# Patient Record
Sex: Female | Born: 1970
Health system: Southern US, Community
[De-identification: ages and names within clinical notes are randomized; demographics above are authoritative.]

## PROBLEM LIST (undated history)

## (undated) DIAGNOSIS — N289 Disorder of kidney and ureter, unspecified: Secondary | ICD-10-CM

## (undated) DIAGNOSIS — F329 Major depressive disorder, single episode, unspecified: Secondary | ICD-10-CM

## (undated) DIAGNOSIS — K829 Disease of gallbladder, unspecified: Secondary | ICD-10-CM

## (undated) DIAGNOSIS — K219 Gastro-esophageal reflux disease without esophagitis: Secondary | ICD-10-CM

## (undated) DIAGNOSIS — E559 Vitamin D deficiency, unspecified: Secondary | ICD-10-CM

## (undated) DIAGNOSIS — Z87448 Personal history of other diseases of urinary system: Secondary | ICD-10-CM

## (undated) DIAGNOSIS — R002 Palpitations: Secondary | ICD-10-CM

## (undated) DIAGNOSIS — D649 Anemia, unspecified: Secondary | ICD-10-CM

## (undated) DIAGNOSIS — F32A Depression, unspecified: Secondary | ICD-10-CM

## (undated) DIAGNOSIS — M549 Dorsalgia, unspecified: Secondary | ICD-10-CM

## (undated) DIAGNOSIS — G43909 Migraine, unspecified, not intractable, without status migrainosus: Secondary | ICD-10-CM

## (undated) HISTORY — DX: Anemia, unspecified: D64.9

## (undated) HISTORY — DX: Gastro-esophageal reflux disease without esophagitis: K21.9

## (undated) HISTORY — DX: Dorsalgia, unspecified: M54.9

## (undated) HISTORY — DX: Vitamin D deficiency, unspecified: E55.9

## (undated) HISTORY — PX: CYSTOSCOPY: SUR368

## (undated) HISTORY — DX: Palpitations: R00.2

## (undated) HISTORY — DX: Personal history of other diseases of urinary system: Z87.448

## (undated) HISTORY — DX: Disorder of kidney and ureter, unspecified: N28.9

## (undated) HISTORY — DX: Major depressive disorder, single episode, unspecified: F32.9

## (undated) HISTORY — DX: Disease of gallbladder, unspecified: K82.9

## (undated) HISTORY — PX: TUBAL LIGATION: SHX77

## (undated) HISTORY — DX: Depression, unspecified: F32.A

## (undated) HISTORY — PX: OTHER SURGICAL HISTORY: SHX169

## (undated) HISTORY — DX: Migraine, unspecified, not intractable, without status migrainosus: G43.909

---

## 2000-01-23 ENCOUNTER — Other Ambulatory Visit: Admission: RE | Admit: 2000-01-23 | Discharge: 2000-01-23 | Payer: Self-pay | Admitting: Obstetrics & Gynecology

## 2001-01-07 ENCOUNTER — Other Ambulatory Visit: Admission: RE | Admit: 2001-01-07 | Discharge: 2001-01-07 | Payer: Self-pay | Admitting: Obstetrics and Gynecology

## 2004-06-20 ENCOUNTER — Other Ambulatory Visit: Admission: RE | Admit: 2004-06-20 | Discharge: 2004-06-20 | Payer: Self-pay | Admitting: *Deleted

## 2005-06-21 ENCOUNTER — Other Ambulatory Visit: Admission: RE | Admit: 2005-06-21 | Discharge: 2005-06-21 | Payer: Self-pay | Admitting: *Deleted

## 2005-12-01 ENCOUNTER — Inpatient Hospital Stay (HOSPITAL_COMMUNITY): Admission: AD | Admit: 2005-12-01 | Discharge: 2005-12-03 | Payer: Self-pay | Admitting: Obstetrics and Gynecology

## 2006-07-24 ENCOUNTER — Inpatient Hospital Stay (HOSPITAL_COMMUNITY): Admission: AD | Admit: 2006-07-24 | Discharge: 2006-07-28 | Payer: Self-pay | Admitting: Obstetrics and Gynecology

## 2006-07-25 ENCOUNTER — Encounter (INDEPENDENT_AMBULATORY_CARE_PROVIDER_SITE_OTHER): Payer: Self-pay | Admitting: Specialist

## 2007-11-28 HISTORY — PX: CHOLECYSTECTOMY: SHX55

## 2008-02-28 ENCOUNTER — Inpatient Hospital Stay (HOSPITAL_COMMUNITY): Admission: RE | Admit: 2008-02-28 | Discharge: 2008-03-02 | Payer: Self-pay | Admitting: Obstetrics and Gynecology

## 2008-02-28 ENCOUNTER — Encounter (INDEPENDENT_AMBULATORY_CARE_PROVIDER_SITE_OTHER): Payer: Self-pay | Admitting: Obstetrics and Gynecology

## 2008-05-04 ENCOUNTER — Emergency Department (HOSPITAL_COMMUNITY): Admission: EM | Admit: 2008-05-04 | Discharge: 2008-05-05 | Payer: Self-pay | Admitting: Emergency Medicine

## 2008-06-09 ENCOUNTER — Encounter: Admission: RE | Admit: 2008-06-09 | Discharge: 2008-06-09 | Payer: Self-pay | Admitting: Gastroenterology

## 2008-10-15 ENCOUNTER — Encounter (INDEPENDENT_AMBULATORY_CARE_PROVIDER_SITE_OTHER): Payer: Self-pay | Admitting: General Surgery

## 2008-10-15 ENCOUNTER — Ambulatory Visit (HOSPITAL_COMMUNITY): Admission: RE | Admit: 2008-10-15 | Discharge: 2008-10-15 | Payer: Self-pay | Admitting: General Surgery

## 2008-11-26 ENCOUNTER — Emergency Department (HOSPITAL_COMMUNITY): Admission: EM | Admit: 2008-11-26 | Discharge: 2008-11-26 | Payer: Self-pay | Admitting: Family Medicine

## 2008-12-01 ENCOUNTER — Encounter
Admission: RE | Admit: 2008-12-01 | Discharge: 2008-12-01 | Payer: Self-pay | Admitting: Physical Medicine and Rehabilitation

## 2009-02-21 ENCOUNTER — Emergency Department (HOSPITAL_COMMUNITY): Admission: EM | Admit: 2009-02-21 | Discharge: 2009-02-21 | Payer: Self-pay | Admitting: Family Medicine

## 2009-03-06 ENCOUNTER — Emergency Department (HOSPITAL_COMMUNITY): Admission: EM | Admit: 2009-03-06 | Discharge: 2009-03-06 | Payer: Self-pay | Admitting: Family Medicine

## 2009-06-22 ENCOUNTER — Ambulatory Visit (HOSPITAL_COMMUNITY): Admission: RE | Admit: 2009-06-22 | Discharge: 2009-06-22 | Payer: Self-pay | Admitting: Obstetrics and Gynecology

## 2009-06-22 ENCOUNTER — Encounter (INDEPENDENT_AMBULATORY_CARE_PROVIDER_SITE_OTHER): Payer: Self-pay | Admitting: Obstetrics and Gynecology

## 2011-03-05 LAB — CBC
HCT: 33.5 % — ABNORMAL LOW (ref 36.0–46.0)
MCHC: 34 g/dL (ref 30.0–36.0)
MCV: 85.6 fL (ref 78.0–100.0)
Platelets: 274 10*3/uL (ref 150–400)
RBC: 3.92 MIL/uL (ref 3.87–5.11)

## 2011-03-08 LAB — POCT URINALYSIS DIP (DEVICE)
Hgb urine dipstick: NEGATIVE
Nitrite: NEGATIVE
Specific Gravity, Urine: <=1.005 (ref 1.005–1.030)
Urobilinogen, UA: 0.2 mg/dL (ref 0.0–1.0)

## 2011-03-08 LAB — WET PREP, GENITAL: Trich, Wet Prep: NONE SEEN

## 2011-03-24 ENCOUNTER — Inpatient Hospital Stay (INDEPENDENT_AMBULATORY_CARE_PROVIDER_SITE_OTHER)
Admission: RE | Admit: 2011-03-24 | Discharge: 2011-03-24 | Disposition: A | Discharge status: Home / Self Care | Payer: 59 | Source: Ambulatory Visit | Attending: Family Medicine | Admitting: Family Medicine

## 2011-03-24 DIAGNOSIS — J029 Acute pharyngitis, unspecified: Secondary | ICD-10-CM

## 2011-04-11 NOTE — Op Note (Signed)
[name], [name]              ACCOUNT NO.:  [id number]   MEDICAL RECORD NO.:  [id number]          PATIENT TYPE:  INP   LOCATION:  9109                          FACILITY[facility]  [facility]   PHYSICIAN:  Maxie Better, M.D.DATE OF BIRTH:  07-Sep-1971   DATE OF PROCEDURE:  02/28/2008  DATE OF DISCHARGE:                               OPERATIVE REPORT   PREOPERATIVE DIAGNOSIS:  Previous cesarean section,  term gestation,  desires permanent sterilization   POSTOPERATIVE DIAGNOSIS:  Previous Cesarean section, term gestation,  desires permanent sterilization   PROCEDURE:  Repeat Cesarean section, Modified Pomeroy Tubal ligation   SURGEON:  Maxie Better, MD.   ANESTHESIA:  Spinal.   ASSISTANT:  None.   SPECIMENS:  Placenta not sent to Pathology, portions of right and left  fallopian tubes to Pathology.   ESTIMATED BLOOD LOSS:  800 ml.   INTRAOPERATIVE FLUIDS:  3300 ml.   URINE OUTPUT:  300 ml of clear yellow urine.   Sponge, needle, and instrument counts were correct.   COMPLICATIONS:  None.  The patient tolerated the procedure well and was  sent to the recovery room in stable condition.   PROCEDURE:  After adequate spinal anesthesia, the patient was placed in  the supine position and was sterilely prepped and draped in the usual  amnner.  The patient was placed in the left lateral position where she  was thoroughly prepped and draped in the usual fashion, the bladder had  an indwelling Foley catheter sterilely placed.  40% Marcaine was  injected at the previous site of the old skin incision site.  The skin  incision was then made, carried down to the rectus fascia, the rectus  fascia was opened transversely, the rectus fascia was then bluntly and  sharply dissected off the rectus muscle in a superior aand inferior  fashion, the rectus muscle was split in the midline, the parietal  peritoneum was entered bluntly and extended, the vesicouterine  peritoneum was opened  transversely after bladder adhesions were lysed.  A curvilinear low transverse uterine incision was then made and extended  with bandage scissors, artificial rupture of membranes was performed,  clear amniotic fluid was noted.  Subsequently, the baby was noted to be  floating in vertex, a vacuum was then used to stabilize and deliver the  head, the baby was bulb suctioned on the abdomen, a loop of cord was  noted against the chest, the baby was subsequently delivered, the cord  was clamped and cut, the baby was handed to the awaiting pediatrician,  who assigned Apgar's of 8 and 9 at one and five minutes, the placenta  was spontaneously intact, the uterine cavity was cleaned of debris, the  uterine incision had no extension, the uterine incision was closed in  two layers, the first had a #0 Monocryl in a running lock stitch, the  second layer with imbricating #0 Monocryl stitch.  Attention was then  turned to the midportion of oth fallopian tubes.  With a Babcock, the  underlying mesosalpinx was opened with electrocautery, the proximal and  distal portions of both fallopian tubes segment was then  tied with #0  chromic sutures x2 proximally and distally, and the intervening segments  were bilaterally removed.  The abdomen was then copiously irrigated and  cleaned of  debris.  There was some bleeding noted on incision line  which were hemostased with suturing.  The parietal peritoneum was then  closed, the vesicouterine peritoneum was not  closed, the rectus fascia  was closed with #0 Vicryl x2. The subcutaneous area was irrigated and  small bleeders cauterized.  Interrupted #2-0 plain sutures were placed  to close the subcutaneous  areas.  The skin was approximated with  Ethibond staples. The patient tolerated the procedure well and was  transferred to recovery area in stable condition.  __________      Maxie Better, M.D.  Electronically Signed     [place]/MEDQ  D:  02/28/2008  T:   02/28/2008  Job:  [id number]

## 2011-04-11 NOTE — Op Note (Signed)
NAMEMAILIN, COGLIANESE              ACCOUNT NO.:  [id number]   MEDICAL RECORD NO.:  [id number]          PATIENT TYPE:  AMB   LOCATION:  SDC                           [facility]  [facility]   PHYSICIAN:  Maxie Better, M.D.DATE OF BIRTH:  1971-09-17   DATE OF PROCEDURE:  06/22/2009  DATE OF DISCHARGE:                               OPERATIVE REPORT   PREOPERATIVE DIAGNOSIS:  Menorrhagia.   PROCEDURE:  Diagnostic hysteroscopy, dilation and curettage, NovaSure  endometrial ablation.   POSTOPERATIVE DIAGNOSIS:  Menorrhagia.   ANESTHESIA:  MAC paracervical block.   SURGEON:  Maxie Better, MD   PROCEDURE:  Under adequate monitored anesthesia, the patient was placed  in the dorsolithotomy position.  She was sterilely prepped and draped in  usual fashion.  Bladder was catheterized for moderate amount of urine.  Examination under anesthesia revealed anteverted, slightly bulky uterus.  No adnexal masses could be appreciated.  A bivalve speculum was placed  in vagina.  A single-tooth tenaculum was placed in the anterior lip of  the cervix.  A 20 mL of 1% Nesacaine was injected paracervically at 3  and 9 o'clock.  The uterus sounded to 9 cm and the initial endocervical  canal sounded to 3.5 cm.  However, the sounding apparatus that comes  with the NovaSure was utilized and the cavity length was actually 6.5.  The diagnostic hysteroscope was introduced.  Both tubal ostia could be  seen.  Focal anterior thickening of the endometrium was noted  anteriorly.  Gentle curettage was performed of the tissue and sent to  Pathology as endometrial curetting.  The NovaSure apparatus was then  inserted.  The cavity tested.  The cavity width of 3.8 cm was then found  and after testing power of 136 with the time of ablation of 75 seconds  was noted.  The apparatus was then subsequently removed.  The  hysteroscope was inserted.  Good endometrial ablation was noted.  At  that point, all instruments were  then removed from the vagina.   SPECIMEN:  Endometrial curetting.   ESTIMATED BLOOD LOSS:  Minimal.   COMPLICATIONS:  None.   FLUID DEFICIT:  100 mL.   Sponge and instrument counts x2 was correct.   COMPLICATIONS:  None.   The patient tolerated the procedure well and was transferred to recovery  room in stable condition.      Maxie Better, M.D.  Electronically Signed     [place]/MEDQ  D:  06/22/2009  T:  06/22/2009  Job:  [id number]

## 2011-04-11 NOTE — Op Note (Signed)
NAMEBRITNEY, [name]              ACCOUNT NO.:  [id number]   MEDICAL RECORD NO.:  [id number]          PATIENT TYPE:  AMB   LOCATION:  DAY                          FACILITY:  [facility]   PHYSICIAN:  Adolph Pollack, M.D.DATE OF BIRTH:  18-Oct-1971   DATE OF PROCEDURE:  10/15/2008  DATE OF DISCHARGE:                               OPERATIVE REPORT   PREOPERATIVE DIAGNOSIS:  Symptomatic cholelithiasis.   POSTOPERATIVE DIAGNOSIS:  Symptomatic cholelithiasis.   PROCEDURE:  Laparoscopic cholecystectomy with intraoperative  cholangiogram.   SURGEON:  Adolph Pollack, M.D.   ASSISTANT:  Consuello Bossier, M.D.   ANESTHESIA:  General.   INDICATIONS:  Ms. [name] is a 40 year old female who has had three  episodes of biliary colic and has a 1 cm gallstone.  Common bile duct  was normal in diameter.  Because of her symptomatic cholelithiasis she  now presents for laparoscopic cholecystectomy.  The procedure risks and  aftercare were discussed with her preoperatively.   TECHNIQUE:  She was brought to the operating room, placed supine on the  operating table and general anesthetic was administered.  Her abdominal  wall was sterilely prepped and draped.  Marcaine was infiltrated in the  subumbilical region.  A small subumbilical incision was made through the  skin, subcutaneous tissue, fascia and entered the peritoneal cavity  under direct vision.  A pursestring suture of zero Vicryl was placed  around the fascial edges.  A Hasson trocar was introduced into the  peritoneal cavity and pneumoperitoneum created by insufflation of CO2  gas.   Next a laparoscope was introduced.  She was noted to have a slightly  pale appearing gallbladder.  She was placed in reverse Trendelenburg  position and the right side tilted slightly upward.   An 11 mm trocar was then placed in the epigastric region and two 5 mm  trocars placed in the right upper quadrant.  I had do a limited  mobilization of the  falciform ligament to allow for better retraction of  the gallbladder.  The fundus of the gallbladder was grasped and  retracted to the right shoulder.  The infundibulum was grasped and using  dissection close to the gallbladder I mobilized the infundibulum.  I  then identified the cystic duct and created a window around it.  There  was an anterior branch of the cystic artery which I identified, clipped  and divided.  Following this I placed a clip at the cystic duct  gallbladder junction.  A small incision was made in the cystic duct with  minimal back flow of bile.  I then placed a cholangiocatheter through  the anterior abdominal wall and was trying to put a cholangiocath into  the cystic duct but there appeared to be a valve in this area.  I was  able to dilate the cystic duct somewhat and then changed over to a  Rettig  catheter.  I was able to place into the cystic duct and  whereupon injecting saline noticed there was what appeared be somewhat  of a somewhat obstructed area at the cystic duct common bile duct  junction.  This was actually soft so it did not feel like a stone.   I went ahead and proceeded with the cholangiogram.  Under real time  fluoroscopy dilute contrast was injected some of which extravasated  around the catheter site but the rest of which went through.  The cystic  duct looked to be of moderate length, common bile duct filled and  promptly drained bile without evidence of any obstruction, the hepatic  right and left hepatic ducts filled.  I did not see an obvious stone.  I  suspect that this was just a valve near the cystic duct bile duct  junction.   I then removed the cholangiocatheter and clipped the cystic duct  proximally and divided.  I then began dissecting the gallbladder free  from the liver and a small hole was made in the gallbladder and a white  bile was spilled out and evacuated with suction.  I then identified a  posterior artery clipped this  and it was divided.  The gallbladder was  dissected the rest of the way using electrocautery and once off of the  liver placed in an Endopouch bag.   I then copiously irrigated out the gallbladder fossa and controlled  bleeding with electrocautery.  I evacuated the fluid as much as  possible.  Once hemostasis was adequate I placed a piece of Surgicel at  the gallbladder fossa.  The gallbladder was removed in an Endopouch bag  through the subumbilical port and the subumbilical fascial defect closed  by tightening up and tying down the pursestring suture.  The remaining  trocars were removed and the pneumoperitoneum was released.  The skin  incisions were closed with 4-0 Monocryl subcuticular stitches followed  by Steri-Strips and sterile dressings.  She tolerated the procedure well  without any apparent complications and was taken to recovery in  satisfactory condition.      Adolph Pollack, M.D.  Electronically Signed     [place]/MEDQ  D:  10/15/2008  T:  10/15/2008  Job:  [id number]   cc:   Chales Salmon. Abigail Miyamoto, M.D.  Fax: [phone]

## 2011-04-14 NOTE — Discharge Summary (Signed)
[name], PODOLAK              ACCOUNT NO.:  [id number]   MEDICAL RECORD NO.:  [id number]          PATIENT TYPE:  INP   LOCATION:  9310                          [facility]ACILITY:  [facility]   PHYSICIAN:  Maxie Better, M.D.DATE OF BIRTH:  1971/08/07   DATE OF ADMISSION:  12/01/2005  DATE OF DISCHARGE:  12/03/2005                                 DISCHARGE SUMMARY   ADMITTING DIAGNOSES:  1.  Acute left pyelonephritis.  2.  Intrauterine gestation at 6 weeks.   DISCHARGE DIAGNOSES:  1.  Left pyelonephritis.  2.  Intrauterine gestation at 6+ weeks.   HISTORY OF PRESENT ILLNESS:  40 year old G1, P0 [redacted] weeks gestation admitted  with a low grade fever and left flank pain.  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   HOSPITAL COURSE:  The patient was admitted to [facility] with  diagnosis of acute onset left pyelonephritis.  She was started on IV  ceftriaxone.  Urine culture was sent.  By hospital day #3 patient began  having watery diarrhea.  Her flank pain had decreased.  Her urine culture  had needed to be reincubated.  The diagnosis of viral syndrome was  entertained at that point.  The IV ceftriaxone was discontinued.  Patient  was started on Imodium.  She was discharged on Keflex 500 mg p.o. q.6h.  pending the final urine culture.   DISPOSITION:  Home.   CONDITION ON DISCHARGE:  Stable.   DISCHARGE MEDICATIONS:  Keflex 500 mg p.o. q.6h.   FOLLOW-UP:  [facility] in the coming week for obstetrical care.   DISCHARGE INSTRUCTIONS:  Call for temperature greater or equal to 100.4,  recurrence of her back pain.      Maxie Better, M.D.  Electronically Signed     [place]/MEDQ  D:  01/10/2006  T:  01/10/2006  Job:  [id number]

## 2011-04-14 NOTE — Op Note (Signed)
[name], [name]              ACCOUNT NO.:  [id number]   MEDICAL RECORD NO.:  [id number]          PATIENT TYPE:  INP   LOCATION:  9113                          [facility]   PHYSICIAN:  Maxie Better, M.D.DATE OF BIRTH:  1971/07/29   DATE OF PROCEDURE:  07/25/2006  DATE OF DISCHARGE:                                 OPERATIVE REPORT   PREOPERATIVE DIAGNOSES:  1. Nonreassuring fetal tracing.  2. Intrauterine growth restriction.  3. Term gestation.  4. Oligohydramnios.   PROCEDURE:  Primary low transverse cesarean section.   POSTOPERATIVE DIAGNOSES:  1. Nonreassuring fetal tracing.  2. Oligohydramnios.  3. Intrauterine growth restriction.  4. Term gestation.   ANESTHESIA:  Spinal.   SURGEON:  Maxie Better, M.D.   ASSISTANT:  Richardean Sale, M.D.   INDICATIONS:  A 40 year old G2 P0 female at 39-6/7 weeks, admitted on July 24, 2006 for induction of labor secondary to oligohydramnios and  intrauterine growth restriction on ultrasound done on July 24, 2006.  The  patient presented with complaint of decreased fetal movement.  She had a  reactive nonstress test.  However, biophysical profile and amniotic fluid  index was ordered, which revealed the oligohydramnios and the intrauterine  growth restriction.  Normal Doppler studies were noted.  The patient was  brought to [facility].  She had Cervidil placement.  She was noted to  be group B Streptococcus positive and had been on suppressive therapy  throughout the pregnancy.  The Cervidil stayed in approximately 11-1/2  hours, with a reassuring tracing.  Subsequently, the tracing was noted to  have fetal tachycardia, with variable decelerations.  Her cervical exam  remained closed and unamenable for a fetal evaluation.  Therefore,  recommendation for cesarean section was made.  Risks of the surgical  procedure were reviewed with the patient, including but not limited to  infection, bleeding, injury to  the bladder, bowel, ureters, internal scar  tissues, and possible need for cesarean section in the future.  The patient  was then transferred to the operating room.   PROCEDURE:  Under adequate spinal anesthesia, the patient was placed in the  supine position with a left lateral tilt.  She was sterilely prepped and  draped in the usual fashion.  Indwelling Foley catheter was sterilely  placed.  0.25% Marcaine was injected along the planned Pfannenstiel incision  site.  Pfannenstiel skin incision was then made, carried down to the rectus  fascia.  The rectus fascia was opened transversely.  The rectus fascia was  then bluntly and sharply dissected off the rectus muscle in superior and  inferior fashion.  The rectus muscle was split in the midline.  The parietal  peritoneum was opened sharply and extended.  The vesicouterine peritoneum  was opened transversely.  The bladder was then bluntly dissected off the  lower uterine segment and then displaced inferiorly using the bladder  retractor.  A curvilinear low transverse uterine incision was then carefully  made and extended with bandage scissors.  On entering the uterine cavity,  pea-soup thick meconium was noted.  Live female infant was delivered.  The  baby was deLeed for moderate amount of meconium.  Cord was then clamped,  cut, and the baby was transferred to the awaiting pediatrician, who  ultimately assigned Apgars of 4 and 9 at one and five minutes.  The placenta  was manually removed and sent to pathology.  Cord pH was obtained, which was  7.29.  The uterine cavity was cleaned of debris.  Uterine incision had no  extension.  Uterine incision was closed in two layers, the first layer with  0 Monocryl running-locked stitch.  The second layer was imbricated using 0  Monocryl suture.  Good hemostasis was noted.  Normal tubes and ovaries were  noted bilaterally.  The abdomen was copiously irrigated and suctioned of  debris.  The parietal  peritoneum was closed with 2-0 Vicryl.  The rectus  fascia was closed with 0 Vicryl x 2.  The subcutaneous area, which was deep,  was irrigated and suctioned. Small bleeders were cauterized, and interrupted  2-0 plain sutures were then placed approximating the subcutaneous space.  The skin was approximated using Ethibond staples.  Specimen was placenta,  sent to pathology.  Estimated blood loss was 700 cc.  Intraoperative fluid  was 2.5 liters.  Urine output was 325 cc clear yellow urine.  Sponge and  instrument counts x2 were correct.   COMPLICATIONS:  None.   Weight of the baby was 5 pounds 3 ounces.  The patient tolerated the  procedure well and was transferred to the recovery room in stable condition.      Maxie Better, M.D.  Electronically Signed     [place]/MEDQ  D:  07/25/2006  T:  07/25/2006  Job:  [id number]

## 2011-04-14 NOTE — Discharge Summary (Signed)
[name], [name]              ACCOUNT NO.:  [id number]   MEDICAL RECORD NO.:  [id number]          PATIENT TYPE:  INP   LOCATION:  9109                          [facility]   PHYSICIAN:  Maxie Better, M.D.DATE OF BIRTH:  Apr 06, 1971   DATE OF ADMISSION:  02/28/2008  DATE OF DISCHARGE:  03/02/2008                               DISCHARGE SUMMARY   ADMISSION DIAGNOSES:  Previous cesarean section, term gestation, and  desired sterilization, Recurrent urinary tract infection   DISCHARGE DIAGNOSES:  Term gestation delivered, desired sterilization,  previous cesarean section, recurrent urinary tract infection.   PROCEDURE:  Repeat cesarean section and modified Pomeroy tubal ligation.   HISTORY OF PRESENT ILLNESS:  A 40 year old gravida 2, para 1 female with  a previous cesarean section now at term admitted for repeat cesarean  section.  The patient also desired permanent sterilization.   HOSPITAL COURSE:  The patient was admitted to [facility].  She  underwent a repeat cesarean section with resultant delivery of a 7-pound  8-ounce live female with loops of cord next to his chest.  Apgars of 9 and  9.  Normal tubes and ovaries were noted at that time.  Portion of the  right and left fallopian tube was removed and sent.  The patient had  uncomplicated postoperative course.  Her CBC on postop day #1 showed a  hemoglobin of 9.3, hematocrit of 26.4, white count of 9.5, platelet  count of 202,000.  The patient is Rh negative.  The baby's blood type  was Rh positive and therefore the patient received RhoGAM.  By postop  day #3, the patient was tolerating a regular diet and was deemed well to  be discharged home.  Her incision showed no evidence of an infection.   DISPOSITION:  Home.   CONDITION:  Stable.   DISCHARGE MEDICATIONS:  1. Keflex 250 mg p.o. nightly.  2. Percocet 1-2 tablets every 8 hours p.r.n. pain.  3. Motrin 600-800 mg one p.o. q.8 h. p.r.n. pain.  4. Prenatal  vitamins one p.o. daily.   FOLLOWUP:  Followup appointment at [facility] in 2-3 days for  staple removal and 6 weeks postpartum.   DISCHARGE INSTRUCTIONS:  Per the postpartum booklet given.      Maxie Better, M.D.  Electronically Signed     [place]/MEDQ  D:  04/15/2008  T:  04/15/2008  Job:  [id number]

## 2011-04-14 NOTE — Discharge Summary (Signed)
NAMEELVI, LEVENTHAL              ACCOUNT NO.:  [id number]   MEDICAL RECORD NO.:  [id number]            PATIENT TYPE:   LOCATION:                                [facility]   PHYSICIAN:  Maxie Better, M.D.    DATE OF BIRTH:   DATE OF ADMISSION:  07/24/2006  DATE OF DISCHARGE:  07/28/2006                                 DISCHARGE SUMMARY   ADMISSION DIAGNOSES:  1. Intrauterine growth restriction.  2. Oligohydramnios.  3. Term gestation.  4. Recurrent urinary tract infections.   DISCHARGE DIAGNOSES:  1. Term gestation, delivered.  2. Intrauterine growth restriction.  3. Oligohydramnios.  4. Recurrent urinary tract infections.  5. Postoperative anemia.   PROCEDURE:  Primary cesarean section Kerr hysterotomy.   HISTORY OF PRESENT ILLNESS:  This is a 40 year old G-2, P-0, 0, 0, 1, 0  female at 18 and 6/7th weeks who was found to have oligohydramnios and an  estimated fetal weight on ultrasound of 6 pounds 2 ounces, which was at the  6th percentile with normal Dopplers.  The patient's renal course had been  notable for a first trimester admission for pyelonephritis.  The patient was  subsequently placed on antibiotic prophylaxis when she had a recurrent  urinary tract infection.  The patient has had adequate weight gain.   HOSPITAL COURSE:  The patient was admitted to [facility].  Her  examination was notable for her cervix being closed, 60%, -3 posterior  station. Her tracing was reactive.  Her Group-B strep was noted to be  positive.  The patient was given Cervidil for ripening.  A low dose of this  was started after the discontinuation of the Cervidil.  Penicillin  prophylaxis started at the time the Pitocin was started.  The cervix had not  done any marked change.  After removal of the Cervidil the tracing was  notable for fetal heart baseline of 170 with moderate variability, moderate  variable decelerations.  Unsure if this is related to contractions;  however,  the cervix  was closed 2 cm and -3 presentation.  Given the inability to  assess the fetus, a non-reassuring fetal tracing was present and therefore a  recommendation for a primary cesarean section was made.   The patient was taken to the operating room where she underwent a primary  cesarean section, which resulted in the delivery of a live female weighing 5  pounds 3 ounces in a pea-soup-thick meconium with the cord around the right  neck and shoulders.  The cord pH was 7.29 with Apgars of 4 and 9.   The patient went on to have an uncomplicated postoperative course.  She had  the resumption of Keflex for suppressive therapy.  The CBC on postoperative  day number one showed a hemoglobin of 9.5, hematocrit 26.9, white count 8.9,  platelet count 224,000.  By postoperative day number three the patient had  flatus and tolerated a regular diet.  The incision was well-approximated  without erythema, induration or exudate.  She was deemed ready to be  discharged home.   DISPOSITION:  Discharge to home.  CONDITION ON DISCHARGE:  Stable.   DISCHARGE MEDICATIONS:  1. Ibuprofen 800 mg q.8h. p.r.n. pain.  2. Percocet one to two tab q.4-6h. p.r.n. pain.  3. Keflex 500 mg q.h.s. daily.  4. Niferex Forte one p.o. daily.   FOLLOWUP:  A follow-up appointment at [facility] in four to six weeks.   DISCHARGE INSTRUCTIONS:  A postpartum booklet given.      Maxie Better, M.D.  Electronically Signed     [place]/MEDQ  D:  08/23/2006  T:  08/24/2006  Job:  [id number]
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
## 2011-08-22 LAB — CBC
HCT: 26.4 — ABNORMAL LOW
HCT: 34.4 — ABNORMAL LOW
MCHC: 35.4
Platelets: 202
RBC: 2.84 — ABNORMAL LOW
RBC: 3.73 — ABNORMAL LOW
RDW: 14.2
WBC: 9.5

## 2011-08-22 LAB — RH IMMUNE GLOB WKUP(>/=20WKS)(NOT WOMEN'S HOSP): Fetal Screen: NEGATIVE

## 2011-08-24 LAB — POCT I-STAT, CHEM 8
BUN: 13
Calcium, Ion: 1.26
Chloride: 103
Creatinine, Ser: 0.8
Glucose, Bld: 95
HCT: 37
Hemoglobin: 12.6
Potassium: 3.8
Sodium: 138
TCO2: 24

## 2011-08-24 LAB — CBC
HCT: 34.7 — ABNORMAL LOW
Hemoglobin: 12.2
MCHC: 35.1
MCV: 89.8
Platelets: 295
RBC: 3.87
RDW: 13.4
WBC: 10

## 2011-08-24 LAB — DIFFERENTIAL
Basophils Absolute: 0
Basophils Relative: 0
Eosinophils Absolute: 0.2
Monocytes Relative: 5
Neutro Abs: 6.6

## 2011-08-24 LAB — HEPATIC FUNCTION PANEL
AST: 19
Albumin: 3.7
Bilirubin, Direct: 0.1
Total Bilirubin: 0.6
Total Protein: 7.1

## 2011-08-29 LAB — COMPREHENSIVE METABOLIC PANEL
ALT: 18
AST: 17
CO2: 25
Calcium: 9.4
Creatinine, Ser: 0.63
GFR calc Af Amer: >60
GFR calc non Af Amer: >60
Glucose, Bld: 106 — ABNORMAL HIGH
Sodium: 139
Total Protein: 6.8

## 2011-08-29 LAB — CBC
HCT: 35.9 — ABNORMAL LOW
Hemoglobin: 12
MCHC: 33.4
RBC: 4.13
RDW: 14.8
WBC: 7.4

## 2011-08-29 LAB — PREGNANCY, URINE: Preg Test, Ur: NEGATIVE

## 2011-08-29 LAB — DIFFERENTIAL
Eosinophils Absolute: 0.1
Lymphocytes Relative: 29
Lymphs Abs: 2.2
Monocytes Relative: 4
Neutrophils Relative %: 65

## 2011-09-01 LAB — POCT PREGNANCY, URINE: Preg Test, Ur: NEGATIVE

## 2011-09-01 LAB — POCT URINALYSIS DIP (DEVICE)
Bilirubin Urine: NEGATIVE
Glucose, UA: NEGATIVE mg/dL
Ketones, ur: NEGATIVE mg/dL
Specific Gravity, Urine: 1.01 (ref 1.005–1.030)

## 2011-09-12 ENCOUNTER — Ambulatory Visit (INDEPENDENT_AMBULATORY_CARE_PROVIDER_SITE_OTHER): Payer: Self-pay | Admitting: Family Medicine

## 2011-09-12 DIAGNOSIS — E669 Obesity, unspecified: Secondary | ICD-10-CM

## 2012-12-12 ENCOUNTER — Other Ambulatory Visit: Payer: Self-pay | Admitting: Obstetrics and Gynecology

## 2012-12-12 DIAGNOSIS — Z1231 Encounter for screening mammogram for malignant neoplasm of breast: Secondary | ICD-10-CM

## 2013-01-13 ENCOUNTER — Ambulatory Visit
Admission: RE | Admit: 2013-01-13 | Discharge: 2013-01-13 | Disposition: A | Discharge status: Home / Self Care | Payer: 59 | Source: Ambulatory Visit | Attending: Obstetrics and Gynecology | Admitting: Obstetrics and Gynecology

## 2013-01-13 DIAGNOSIS — Z1231 Encounter for screening mammogram for malignant neoplasm of breast: Secondary | ICD-10-CM

## 2013-09-01 ENCOUNTER — Other Ambulatory Visit (HOSPITAL_COMMUNITY): Payer: Self-pay | Admitting: Orthopedic Surgery

## 2013-09-01 DIAGNOSIS — M25511 Pain in right shoulder: Secondary | ICD-10-CM

## 2013-09-08 ENCOUNTER — Ambulatory Visit (HOSPITAL_COMMUNITY)
Admission: RE | Admit: 2013-09-08 | Discharge: 2013-09-08 | Disposition: A | Discharge status: Home / Self Care | Payer: 59 | Source: Ambulatory Visit | Attending: Orthopedic Surgery | Admitting: Orthopedic Surgery

## 2013-09-08 DIAGNOSIS — M67919 Unspecified disorder of synovium and tendon, unspecified shoulder: Secondary | ICD-10-CM | POA: Insufficient documentation

## 2013-09-08 DIAGNOSIS — G8929 Other chronic pain: Secondary | ICD-10-CM | POA: Insufficient documentation

## 2013-09-08 DIAGNOSIS — S42013A Anterior displaced fracture of sternal end of unspecified clavicle, initial encounter for closed fracture: Secondary | ICD-10-CM | POA: Insufficient documentation

## 2013-09-08 DIAGNOSIS — M719 Bursopathy, unspecified: Secondary | ICD-10-CM | POA: Insufficient documentation

## 2013-09-08 DIAGNOSIS — M25511 Pain in right shoulder: Secondary | ICD-10-CM

## 2013-09-08 DIAGNOSIS — W11XXXA Fall on and from ladder, initial encounter: Secondary | ICD-10-CM | POA: Insufficient documentation

## 2013-10-27 ENCOUNTER — Encounter: Payer: 59 | Attending: Obstetrics and Gynecology | Admitting: Dietician

## 2013-10-27 ENCOUNTER — Encounter: Payer: Self-pay | Admitting: Dietician

## 2013-10-27 VITALS — Ht 64.0 in | Wt 213.3 lb

## 2013-10-27 DIAGNOSIS — Z713 Dietary counseling and surveillance: Secondary | ICD-10-CM | POA: Insufficient documentation

## 2013-10-27 DIAGNOSIS — E669 Obesity, unspecified: Secondary | ICD-10-CM | POA: Insufficient documentation

## 2013-10-27 NOTE — Progress Notes (Signed)
Medical Nutrition Therapy:  Appt start time: 1000 end time:  1115.  Assessment:  Primary concerns today: [name] is here today for help with weight loss. [name] is a [employer].  States she keeps starting to lose weight and "hits road blocks". Has been overweight most of her adult life. Weight had fluctuated between 190-230 lbs in the past 10 years. Has tried doing training for 5 k's, eating "clean", and then reverts back to "stress eating". Has tried doing a the [employer] and exercise program through April until husband got sick.   Live with her husband and kids and states that she is responsible for meal planning. Would like to make sure that two kids develop healthy eating habits too. More focused on snacks when focus is on nutrition. Often gets busy with kids and cleaning in the morning. Will go to a drive through about 1 x week.   Preferred Learning Style:   No preference indicated   Learning Readiness:   Contemplating  Ready   MEDICATIONS: see list   DIETARY INTAKE:  Avoided foods include brussels spouts, red meat at home, white rice, walnuts, pecans  24-hr recall:  B ( AM): coffee (1 pot total most days) with low fat creamer and artificial sweetener, shredded wheat with skim milk or scrambled eggs and [place] bacon with toast or oatmeal with brown sugar and raisins  Snk ( AM): not usually, "forgets" L ( PM): meal replacement bar, tuna and vegetables, leftovers (insonistant meal) with water, sweet tea, or diet soda Snk ( PM): not usually, sometimes cheese stick D ( PM): ground [place] tacos, spaghetti, meatloaf with macaroni and cheese with green vegetables or green salad, chili, eggs and bacon with sweet tea  Snk ( PM): cookies or popcorn, frozen yogurt with cookie Beverages: coffee, water, diet soda, sweet tea  Usual physical activity: none at this time  Estimated energy needs: 1800 calories 200 g carbohydrates 135 g protein 50 g fat  Progress  Towards Goal(s):  In progress.   Nutritional Diagnosis:  [place]-3.3 Overweight/obesity As related to hx of excess snacking, larger portions, and inadequate physical activity.  As evidenced by BMI of 36.6.    Intervention:  Nutrition counseling provided. Encouraged Ailea to find motivation to eat healthy foods and exercise besides the numbers on the scale which can cause her a lot of stress. Discussed proper portion sizes and the importance of eating when hungry instead of waiting until she is starving when it is difficult to make healthy choices. Recommended limiting artificial sweeteners to see if that helps reduce her cravings for sweets.   Plan: Consider drinking mostly water for beverages. Consider limiting artificially sweeteners to help with sugar cravings. Have snacks with protein available to eat when hungry in the middle of the day and during work. Fill half of you plate with vegetables, especially at dinner time (try spaghetti squash) Consider not adding stress to your life when possible: don't weight yourself and don't use My Fitness Pal if adds to stress level. Consider the way you feel, setting a good example for your kids, and preventing future health problems as a motivation for healthy eating. Try to eat smaller portions at meals and allow yourself to have more food if you are still hungry after 20 minutes. Aim to get 30 minutes of exercise most days of the week.   Teaching Method Utilized:  Visual Auditory  Handouts given during visit include:  MyPlate Handout  15 g CHO Snacks  Barriers  to learning/adherence to lifestyle change: busy schedule, hx of inconsistent motivation  Demonstrated degree of understanding via:  Teach Back   Monitoring/Evaluation:  Dietary intake, exercise, mindful eating, and body weight in 2 month(s).

## 2013-10-27 NOTE — Patient Instructions (Signed)
Consider drinking mostly water for beverages. Consider limiting artificially sweeteners to help with sugar cravings. Have snacks with protein available to eat when hungry in the middle of the day and during work. Fill half of you plate with vegetables, especially at dinner time (try spaghetti squash) Consider not adding stress to your life when possible: don't weight yourself and don't use My Fitness Pal if adds to stress level. Consider the way you feel, setting a good example for your kids, and preventing future health problems as a motivation for healthy eating. Try to eat smaller portions at meals and allow yourself to have more food if you are still hungry after 20 minutes. Aim to get 30 minutes of exercise most days of the week.

## 2013-12-29 ENCOUNTER — Encounter: Payer: 59 | Attending: Obstetrics and Gynecology | Admitting: Dietician

## 2013-12-29 VITALS — Ht 64.0 in | Wt 218.4 lb

## 2013-12-29 DIAGNOSIS — E669 Obesity, unspecified: Secondary | ICD-10-CM | POA: Insufficient documentation

## 2013-12-29 DIAGNOSIS — Z713 Dietary counseling and surveillance: Secondary | ICD-10-CM | POA: Insufficient documentation

## 2013-12-29 NOTE — Patient Instructions (Addendum)
Consider drinking mostly water for beverages. Continue limiting artificial sweeteners to help with sugar cravings. Have snacks with protein available to eat when hungry in the middle of the day and during work. Fill half of you plate with vegetables, especially at dinner time (try spaghetti squash) Consider not adding stress to your life when possible: don't weigh yourself and don't use My Fitness Pal if adds to stress level. Try to eat smaller portions at meals and allow yourself to have more food if you are still hungry after 20 minutes. Aim to get 30 minutes of exercise most days of the week.  Work on taking naps when needed after working night shifts.  If struggling with depression, consider talking to a counselor. Stop buying Skittles and other sweets if they are tempting.Marland KitchenMarland Kitchen

## 2013-12-29 NOTE — Progress Notes (Signed)
Medical Nutrition Therapy:  Appt start time: 900 end time:  945.  Assessment:  Primary concerns today: [name] is here today for help with weight loss. [name] is a [employer]. [name] returns today with a 5 lbs weight gain. Stopped weighing herself regularly to avoid being stressed, but feels like she should weigh herself when she "isn't being good". Overall struggling with consistency, portion sizes, and eating skittles and other candy. Dealing with a lot of depression recently, though has been getting better in last few weeks.   Starting doing more exercise and drinking more water in the past 2 weeks.   Wt Readings from Last 3 Encounters:  12/29/13 218 lb 6.4 oz (99.066 kg)  10/27/13 213 lb 4.8 oz (96.752 kg)   Ht Readings from Last 3 Encounters:  12/29/13 5\' 4"  (1.626 m)  10/27/13 5\' 4"  (1.626 m)   Body mass index is 37.47 kg/(m^2). @BMIFA @ Normalized weight-for-age data available only for age 87 to 87 years. Normalized stature-for-age data available only for age 87 to 19 years.   Preferred Learning Style:   No preference indicated   Learning Readiness:   Contemplating  Ready   MEDICATIONS: see list   DIETARY INTAKE:  Avoided foods include brussels spouts, red meat at home, white rice, walnuts, pecans  24-hr recall:  B ( AM): coffee (1 pot total most days) with low fat creamer and artificial sweetener, shredded wheat with skim milk or scrambled eggs and [place] bacon with toast or oatmeal with brown sugar and raisins/blueberries  Snk ( AM): not usually, "forgets" L ( PM): meal replacement bar, tuna and vegetables, leftovers (insonistant meal) with water, sweet tea, or diet soda Snk ( PM): Trying to eat more at this time - peanut butter crackers or cheese stick D ( PM): ground [place] tacos, spaghetti, meatloaf with macaroni and cheese with green vegetables or green salad, chili, eggs and bacon with sweet tea  Snk ( PM): cookies or popcorn, frozen yogurt with  cookie Beverages: coffee, water, diet soda, sweet tea  Usual physical activity: Jillian Michael's workout in morning, pilates class at [facility], home workout (2-3 x week)   Estimated energy needs: 1800 calories 200 g carbohydrates 135 g protein 50 g fat  Progress Towards Goal(s):  In progress.   Nutritional Diagnosis:  [place]-3.3 Overweight/obesity As related to hx of excess snacking, larger portions, and inadequate physical activity.  As evidenced by BMI of 36.6.    Intervention:  Nutrition counseling provided.   Plan: Consider drinking mostly water for beverages. Continue limiting artificial sweeteners to help with sugar cravings. Have snacks with protein available to eat when hungry in the middle of the day and during work. Fill half of you plate with vegetables, especially at dinner time (try spaghetti squash) Consider not adding stress to your life when possible: don't weigh yourself and don't use My Fitness Pal if adds to stress level. Try to eat smaller portions at meals and allow yourself to have more food if you are still hungry after 20 minutes. Aim to get 30 minutes of exercise most days of the week.  Work on taking naps when needed after working night shifts.  If struggling with depression, consider talking to a counselor. Stop buying Skittles and other sweets if they are tempting...  Teaching Method Utilized:  Visual Auditory  Handouts given during visit include:  Therapist handouts  Barriers to learning/adherence to lifestyle change: busy schedule, hx of inconsistent motivation  Demonstrated degree of understanding via:  Teach  Back   Monitoring/Evaluation:  Dietary intake, exercise, mindful eating, and body weight in 2 month(s).

## 2014-02-20 ENCOUNTER — Other Ambulatory Visit: Payer: Self-pay

## 2014-02-20 DIAGNOSIS — Z1231 Encounter for screening mammogram for malignant neoplasm of breast: Secondary | ICD-10-CM

## 2014-02-26 ENCOUNTER — Encounter: Payer: 59 | Attending: Obstetrics and Gynecology | Admitting: Dietician

## 2014-02-26 VITALS — Ht 64.0 in | Wt 210.1 lb

## 2014-02-26 DIAGNOSIS — E669 Obesity, unspecified: Secondary | ICD-10-CM | POA: Insufficient documentation

## 2014-02-26 DIAGNOSIS — Z713 Dietary counseling and surveillance: Secondary | ICD-10-CM | POA: Insufficient documentation

## 2014-02-26 NOTE — Patient Instructions (Signed)
Keep up the good work!!! 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 Have snacks with protein available to eat when hungry in the middle of the day and during work. Continuing filling half of you plate with vegetables, especially at dinner time. Keep working on smaller portions. Enjoy small splurges!

## 2014-02-26 NOTE — Progress Notes (Signed)
  Medical Nutrition Therapy:  Appt start time: 830 end time:  900.  Assessment:  Primary concerns today: [name] is here today for help with weight loss with an 8 lb weight loss. Feels like her depression is much better than last time. Has been working out - [employer] in the morning or walking in the afternoon. Has found that exercising really helps her depression. Feels like she has been making better choices with her food though feels like she could do better.    Wt Readings from Last 3 Encounters:  02/26/14 210 lb 1.6 oz (95.301 kg)  12/29/13 218 lb 6.4 oz (99.066 kg)  10/27/13 213 lb 4.8 oz (96.752 kg)   Ht Readings from Last 3 Encounters:  02/26/14 5\' 4"  (1.626 m)  12/29/13 5\' 4"  (1.626 m)  10/27/13 5\' 4"  (1.626 m)   Body mass index is 36.05 kg/(m^2). @BMIFA @ Normalized weight-for-age data available only for age 50 to 33 years. Normalized stature-for-age data available only for age 50 to 18 years.   Preferred Learning Style:   No preference indicated   Learning Readiness:   Ready   MEDICATIONS: see list   DIETARY INTAKE:  Avoided foods include brussels spouts, red meat at home, white rice, walnuts, pecans  24-hr recall:  B ( AM): coffee (1 pot total most days) with low fat creamer and artificial sweetener, shredded wheat with skim milk or scrambled eggs and [place] bacon with toast or oatmeal with brown sugar and raisins/blueberries  Snk ( AM): not usually, "forgets" L ( PM): meal replacement bar, tuna and vegetables, leftovers (insonistant meal) with water, sweet tea, or diet soda Snk ( PM): Trying to eat more at this time - peanut butter crackers or cheese stick D ( PM): ground [place] tacos, spaghetti, meatloaf with macaroni and cheese with green vegetables or green salad, chili, eggs and bacon with sweet tea  Snk ( PM): cookies or popcorn, frozen yogurt with cookie Beverages: coffee, water, diet soda, sweet tea  Usual physical activity: Jillian  Michael's workout in morning, pilates class at [facility] home workout (2-3 x week)   Estimated energy needs: 1800 calories 200 g carbohydrates 135 g protein 50 g fat  Progress Towards Goal(s):  In progress.   Nutritional Diagnosis:  [place]-3.3 Overweight/obesity As related to hx of excess snacking, larger portions, and inadequate physical activity.  As evidenced by BMI of 36.6.    Intervention:  Nutrition counseling provided.   Plan: Keep up the good work!!! 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 Have snacks with protein available to eat when hungry in the middle of the day and during work. Continuing filling half of you plate with vegetables, especially at dinner time. Keep working on smaller portions. Enjoy small splurges!   Teaching Method Utilized:  Visual Auditory   Barriers to learning/adherence to lifestyle change: none  Demonstrated degree of understanding via:  Teach Back   Monitoring/Evaluation:  Dietary intake, exercise, mindful eating, and body weight in 3 month(s).

## 2014-03-13 ENCOUNTER — Ambulatory Visit: Payer: 59

## 2014-03-24 ENCOUNTER — Ambulatory Visit
Admission: RE | Admit: 2014-03-24 | Discharge: 2014-03-24 | Disposition: A | Discharge status: Home / Self Care | Payer: 59 | Source: Ambulatory Visit

## 2014-03-24 ENCOUNTER — Encounter (INDEPENDENT_AMBULATORY_CARE_PROVIDER_SITE_OTHER): Payer: Self-pay

## 2014-03-24 DIAGNOSIS — Z1231 Encounter for screening mammogram for malignant neoplasm of breast: Secondary | ICD-10-CM

## 2014-05-14 ENCOUNTER — Ambulatory Visit: Payer: 59 | Attending: Orthopedic Surgery

## 2014-05-14 DIAGNOSIS — IMO0001 Reserved for inherently not codable concepts without codable children: Secondary | ICD-10-CM | POA: Insufficient documentation

## 2014-05-14 DIAGNOSIS — M25519 Pain in unspecified shoulder: Secondary | ICD-10-CM | POA: Insufficient documentation

## 2014-05-14 DIAGNOSIS — M25619 Stiffness of unspecified shoulder, not elsewhere classified: Secondary | ICD-10-CM | POA: Insufficient documentation

## 2014-05-14 DIAGNOSIS — M542 Cervicalgia: Secondary | ICD-10-CM | POA: Insufficient documentation

## 2014-05-22 ENCOUNTER — Ambulatory Visit: Payer: 59 | Admitting: Physical Therapy

## 2014-05-28 ENCOUNTER — Encounter: Payer: 59 | Attending: Obstetrics and Gynecology | Admitting: Dietician

## 2014-05-28 VITALS — Ht 64.0 in | Wt 208.1 lb

## 2014-05-28 DIAGNOSIS — Z713 Dietary counseling and surveillance: Secondary | ICD-10-CM | POA: Insufficient documentation

## 2014-05-28 DIAGNOSIS — E669 Obesity, unspecified: Secondary | ICD-10-CM | POA: Insufficient documentation

## 2014-05-28 NOTE — Progress Notes (Signed)
  Medical Nutrition Therapy:  Appt start time: 945 end time:  1517.  Assessment:  Primary concerns today: [name] is here today for help with weight loss with an 2 lb weight loss. Stopped taking Wellbutrin for depression and feels like things are getting better and that exercise is helping.   Starting using My Fitness Pal again which is helping with seeing how much she is eating and using a Ecologist. Has "fallen off" some weeks but has been able to "get back on". Not weighing as frequently which is helping combat with negative feelings. Trying to focus on positive - like how her physical endurance has improved.   Had recent blood work and everything was in normal range.    Wt Readings from Last 3 Encounters:  05/28/14 208 lb 1.6 oz (94.394 kg)  02/26/14 210 lb 1.6 oz (95.301 kg)  12/29/13 218 lb 6.4 oz (99.066 kg)   Ht Readings from Last 3 Encounters:  05/28/14 5\' 4"  (1.626 m)  02/26/14 5\' 4"  (1.626 m)  12/29/13 5\' 4"  (1.626 m)   Body mass index is 35.7 kg/(m^2). @BMIFA @ Normalized weight-for-age data available only for age 57 to 18 years. Normalized stature-for-age data available only for age 57 to 46 years.   Preferred Learning Style:   No preference indicated   Learning Readiness:   Ready   MEDICATIONS: see list   DIETARY INTAKE:  Avoided foods include brussels spouts, red meat at home, white rice, walnuts, pecans  24-hr recall:  B ( AM): coffee (1 pot total most days) with low fat creamer and artificial sweetener, shredded wheat with skim milk or scrambled eggs and [place] bacon with toast or oatmeal with brown sugar and raisins/blueberries  Snk ( AM): not usually, "forgets" L ( PM): meal replacement bar, tuna and vegetables, leftovers (insonistant meal) with water, sweet tea, or diet soda Snk ( PM): Trying to eat more at this time - peanut butter crackers or cheese stick D ( PM): ground [place] tacos, spaghetti, meatloaf with macaroni and cheese with green vegetables or  green salad, chili, eggs and bacon with sweet tea  Snk ( PM): cookies or popcorn, frozen yogurt with cookie Beverages: coffee, water, diet soda, sweet tea  Usual physical activity: Jillian Michael's workout in morning or [employer] exercise, elliptical, walking. Trying to be careful with shoulder that she injured. (3 x week)   Estimated energy needs: 1800 calories 200 g carbohydrates 135 g protein 50 g fat  Progress Towards Goal(s):  In progress.   Nutritional Diagnosis:  [name]-3.3 Overweight/obesity As related to hx of excess snacking, larger portions, and inadequate physical activity.  As evidenced by BMI of 36.6.    Intervention:  Nutrition counseling provided.   Plan: Keep up the good work!!! 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 Have snacks with protein available to eat when hungry in the middle of the day and during work. Continuing filling half of you plate with vegetables, especially at dinner time. Keep working on smaller portions. Enjoy small splurges!   Teaching Method Utilized:  Visual Auditory   Barriers to learning/adherence to lifestyle change: none  Demonstrated degree of understanding via:  Teach Back   Monitoring/Evaluation:  Dietary intake, exercise, mindful eating, and body weight in 6 month(s).

## 2014-05-28 NOTE — Patient Instructions (Signed)
Focus on the positive to help stay motivated!   Keep drinking water or diet drinks for beverages. Have snacks with protein available to eat when hungry in the middle of the day and during work. Continuing filling half of you plate with vegetables, especially at dinner time. Keep working on smaller portions. Enjoy small splurges!

## 2014-06-01 ENCOUNTER — Ambulatory Visit: Payer: 59 | Attending: Orthopedic Surgery | Admitting: Rehabilitation

## 2014-06-01 DIAGNOSIS — IMO0001 Reserved for inherently not codable concepts without codable children: Secondary | ICD-10-CM | POA: Insufficient documentation

## 2014-06-01 DIAGNOSIS — M25519 Pain in unspecified shoulder: Secondary | ICD-10-CM | POA: Insufficient documentation

## 2014-06-01 DIAGNOSIS — M25619 Stiffness of unspecified shoulder, not elsewhere classified: Secondary | ICD-10-CM | POA: Insufficient documentation

## 2014-06-01 DIAGNOSIS — M542 Cervicalgia: Secondary | ICD-10-CM | POA: Insufficient documentation

## 2014-06-04 ENCOUNTER — Ambulatory Visit: Payer: 59 | Admitting: Physical Therapy

## 2014-06-15 ENCOUNTER — Ambulatory Visit: Payer: 59 | Admitting: Rehabilitation

## 2014-06-17 ENCOUNTER — Ambulatory Visit: Payer: 59

## 2014-06-23 ENCOUNTER — Ambulatory Visit: Payer: 59 | Admitting: Physical Therapy

## 2014-06-26 ENCOUNTER — Ambulatory Visit: Payer: 59 | Admitting: Physical Therapy

## 2014-06-30 ENCOUNTER — Ambulatory Visit: Payer: 59 | Attending: Orthopedic Surgery | Admitting: Physical Therapy

## 2014-06-30 DIAGNOSIS — M25619 Stiffness of unspecified shoulder, not elsewhere classified: Secondary | ICD-10-CM | POA: Insufficient documentation

## 2014-06-30 DIAGNOSIS — IMO0001 Reserved for inherently not codable concepts without codable children: Secondary | ICD-10-CM | POA: Diagnosis present

## 2014-06-30 DIAGNOSIS — M542 Cervicalgia: Secondary | ICD-10-CM | POA: Insufficient documentation

## 2014-06-30 DIAGNOSIS — M25519 Pain in unspecified shoulder: Secondary | ICD-10-CM | POA: Diagnosis not present

## 2014-07-09 ENCOUNTER — Ambulatory Visit: Payer: 59 | Admitting: Physical Therapy

## 2014-07-09 DIAGNOSIS — IMO0001 Reserved for inherently not codable concepts without codable children: Secondary | ICD-10-CM | POA: Diagnosis not present

## 2014-12-01 ENCOUNTER — Encounter: Payer: 59 | Attending: Family Medicine | Admitting: Dietician

## 2014-12-01 DIAGNOSIS — Z6835 Body mass index (BMI) 35.0-35.9, adult: Secondary | ICD-10-CM | POA: Diagnosis not present

## 2014-12-01 DIAGNOSIS — E669 Obesity, unspecified: Secondary | ICD-10-CM | POA: Diagnosis not present

## 2014-12-01 DIAGNOSIS — Z713 Dietary counseling and surveillance: Secondary | ICD-10-CM | POA: Insufficient documentation

## 2014-12-01 NOTE — Patient Instructions (Addendum)
Focus on the positive to help stay motivated!  Keep drinking mostly water. Have snacks with protein available to eat when hungry in the middle of the day and during work. Continuing filling half of you plate with vegetables, especially at dinner time. Keep working on smaller portions. Enjoy small splurges!   Plan to do 30 minute DVD most days and add [facility] classes if possible.

## 2014-12-01 NOTE — Progress Notes (Signed)
  Medical Nutrition Therapy:  Appt start time: 925 end time:  950.  Assessment:  Primary concerns today: [name] is here today for help with weight loss with an 5 lb weight loss. Got down to 195 lbs December 1st which was her lowest weight and regained some. Has been doing the "[employer] Diet" mostly using portion sizes and is doing it for 21 days. Did this at the end of August too. Eating fruits, vegetables, whole grains and has cut out a lot of processed foods.   Weight has been "up and down".  Focusing on how clothes are fitting instead of number on the scale. Still struggling with consistently. Trying to cut back on diet soda and coffee.   Wt Readings from Last 3 Encounters:  05/28/14 208 lb 1.6 oz (94.394 kg)  02/26/14 210 lb 1.6 oz (95.301 kg)  12/29/13 218 lb 6.4 oz (99.066 kg)   Ht Readings from Last 3 Encounters:  05/28/14 5\' 4"  (1.626 m)  02/26/14 5\' 4"  (1.626 m)  12/29/13 5\' 4"  (1.626 m)   There is no weight on file to calculate BMI. @BMIFA @ Normalized weight-for-age data available only for age 51 to 84 years. Normalized stature-for-age data available only for age 51 to 55 years.   Preferred Learning Style:   No preference indicated   Learning Readiness:   Ready   MEDICATIONS: see list   DIETARY INTAKE:  Avoided foods include brussels spouts, red meat at home, white rice, walnuts, pecans  24-hr recall:  B ( AM): coffee with low fat creamer and artificial sweetener, shredded wheat with skim milk or scrambled eggs and [place] bacon with toast or oatmeal with brown sugar and raisins/blueberries  Snk ( AM): not usually, "forgets" L ( PM): meal replacement bar, tuna and vegetables, leftovers (insonistant meal) with water, sweet tea, or diet soda Snk ( PM): Trying to eat more at this time - peanut butter crackers or cheese stick D ( PM): ground [place] tacos, spaghetti, meatloaf with macaroni and cheese with green vegetables or green salad, chili, eggs and bacon with sweet  tea  Snk ( PM): cookies or popcorn, frozen yogurt with cookie Beverages: coffee, water, unsweet tea  Usual physical activity: exercise DVD 30 minutes most days  Estimated energy needs: 1800 calories 200 g carbohydrates 135 g protein 50 g fat  Progress Towards Goal(s):  In progress.   Nutritional Diagnosis:  [place]-3.3 Overweight/obesity As related to hx of excess snacking, larger portions, and inadequate physical activity.  As evidenced by BMI of 36.6.    Intervention:  Nutrition counseling provided.   Plan: Focus on the positive to help stay motivated!  Keep drinking mostly water. Have snacks with protein available to eat when hungry in the middle of the day and during work. Continuing filling half of you plate with vegetables, especially at dinner time. Keep working on smaller portions. Enjoy small splurges!   Plan to do 30 minute DVD most days and add [facility] classes if possible.   Teaching Method Utilized:  Visual Auditory   Barriers to learning/adherence to lifestyle change: none  Demonstrated degree of understanding via:  Teach Back   Monitoring/Evaluation:  Dietary intake, exercise, mindful eating, and body weight in 2 month(s).

## 2015-02-01 ENCOUNTER — Encounter: Payer: 59 | Attending: Obstetrics and Gynecology | Admitting: Dietician

## 2015-02-01 VITALS — Wt 200.7 lb

## 2015-02-01 DIAGNOSIS — Z713 Dietary counseling and surveillance: Secondary | ICD-10-CM | POA: Insufficient documentation

## 2015-02-01 DIAGNOSIS — E669 Obesity, unspecified: Secondary | ICD-10-CM | POA: Insufficient documentation

## 2015-02-01 DIAGNOSIS — Z6835 Body mass index (BMI) 35.0-35.9, adult: Secondary | ICD-10-CM | POA: Diagnosis not present

## 2015-02-01 NOTE — Progress Notes (Signed)
  Medical Nutrition Therapy:  Appt start time: 1125 end time: 1150  .  Assessment:  Primary concerns today: Jozee is here today for help with weight loss with an 2-3 lb weight loss since January. Is in the process of selling her house so she is very busy. Has been working on "clean eating" and having a few treats every once in a while. Trying to find balance in her diet. Started going to a yoga class for the past 4 weeks. Still doing some of the "[place] Diet" but trying to make it more simple (portion control).   Has been cutting back on diet soda which makes her feel bloated. Has not been having as many sweets at night.   Preferred Learning Style:   No preference indicated   Learning Readiness:   Ready   MEDICATIONS: see list   DIETARY INTAKE:  Avoided foods include brussels spouts, red meat at home, white rice, walnuts, pecans  24-hr recall:  B ( AM): coffee with low fat creamer and artificial sweetener, shredded wheat with skim milk or scrambled eggs and [place] bacon with toast or oatmeal with brown sugar and raisins/blueberries  Snk ( AM): not usually, "forgets" L ( PM): meal replacement bar, tuna and vegetables, leftovers (insonistant meal) with water, sweet tea Snk ( PM): Trying to eat more at this time - peanut butter crackers or cheese stick D ( PM): ground [place] tacos, spaghetti, meatloaf with macaroni and cheese with green vegetables or green salad, chili,  Snk ( PM): none or a few chocolate chips Beverages: coffee with 1/2 pack of stevia and low fat creamer, water, unsweet tea  Usual physical activity: yoga 1 x week, gym at work, or walking 4 miles  (not consistently yet)   Estimated energy needs: 1800 calories 200 g carbohydrates 135 g protein 50 g fat  Progress Towards Goal(s):  In progress.   Nutritional Diagnosis:  [place]-3.3 Overweight/obesity As related to hx of excess snacking, larger portions, and inadequate physical activity.  As evidenced by BMI of  36.6.    Intervention:  Nutrition counseling provided.   Plan: Focus on the positive to help stay motivated!  Keep drinking mostly water. Have snacks with protein available to eat when hungry in the middle of the day and during work. Continuing filling half of you plate with vegetables, especially at dinner time. Keep working on smaller portions. Enjoy small splurges!   Plan to do 30 minute DVD most days and add [facility] classes if possible.   Teaching Method Utilized:  Visual Auditory   Barriers to learning/adherence to lifestyle change: none  Demonstrated degree of understanding via:  Teach Back   Monitoring/Evaluation:  Dietary intake, exercise, mindful eating, and body weight in 2 month(s).

## 2015-02-01 NOTE — Patient Instructions (Addendum)
Focus on the positive to help stay motivated!  Keep drinking mostly water. Have snacks with protein available to eat when hungry in the middle of the day and during work. Continuing filling half of you plate with vegetables, especially at dinner time. Keep working on smaller portions. Enjoy small splurges!  Plan to exercise 3 x week (yoga, videos, and walking).

## 2015-04-05 ENCOUNTER — Ambulatory Visit: Payer: 59 | Admitting: Dietician

## 2015-04-09 ENCOUNTER — Encounter: Payer: 59 | Attending: Obstetrics and Gynecology | Admitting: Dietician

## 2015-04-09 ENCOUNTER — Encounter: Payer: Self-pay | Admitting: Dietician

## 2015-04-09 VITALS — Wt 205.5 lb

## 2015-04-09 DIAGNOSIS — Z6835 Body mass index (BMI) 35.0-35.9, adult: Secondary | ICD-10-CM | POA: Insufficient documentation

## 2015-04-09 DIAGNOSIS — Z713 Dietary counseling and surveillance: Secondary | ICD-10-CM | POA: Insufficient documentation

## 2015-04-09 DIAGNOSIS — E669 Obesity, unspecified: Secondary | ICD-10-CM | POA: Insufficient documentation

## 2015-04-09 NOTE — Progress Notes (Signed)
  Medical Nutrition Therapy:  Appt start time: 830 end time: 900.  Assessment:  Primary concerns today: [name] is here today for help with weight loss with an 5.5lb weight gain since March. Has moved into new house. Had some stress with moving and hasn't been eating well (eating out a lot) and has been busy with her kids' school activity. Has been exercising 3-4 x week (especially the last few week). Has been using MyFitnessPal.     Has done some stress eating and is thinking about talking to someone about it.   Preferred Learning Style:   No preference indicated   Learning Readiness:   Ready   MEDICATIONS: see list   DIETARY INTAKE:  Avoided foods include brussels spouts, red meat at home, white rice, walnuts, pecans  24-hr recall:  B ( AM): coffee with low fat creamer and artificial sweetener, shredded wheat with skim milk or scrambled eggs and [place] bacon with toast or oatmeal with brown sugar and raisins/blueberries  Snk ( AM): not usually, "forgets" L ( PM): meal replacement bar, tuna and vegetables, leftovers (insonistant meal) with water, sweet tea Snk ( PM): Trying to eat more at this time - peanut butter crackers or cheese stick D ( PM): ground [place] tacos, spaghetti, meatloaf with macaroni and cheese with green vegetables or green salad, chili,  Snk ( PM): none or a few chocolate chips Beverages: coffee with 1/2 pack of stevia and low fat creamer, water, unsweet tea  Usual physical activity: yoga 1 x week, gym at work, or walking 4 miles  (not consistently yet)   Estimated energy needs: 1800 calories 200 g carbohydrates 135 g protein 50 g fat  Progress Towards Goal(s):  In progress.   Nutritional Diagnosis:  -3.3 Overweight/obesity As related to hx of excess snacking, larger portions, and inadequate physical activity.  As evidenced by BMI of 36.6.    Intervention:  Nutrition counseling provided.   Plan: Focus on the positive to help stay motivated!  Plan  to eat out dinner about 1 x week (Tuesdays). Pack sandwiches, yogurt, fruit, vegetables with dips, cheese and crackers, etc. for busy nights. Plan for splurges a few times per week. Continue exercising 3-4 x week (yoga, DVDs, gym).  Consider talking to a counselor about emotional eating/stress management/not giving up on yourself.  Teaching Method Utilized:  Visual Auditory   Barriers to learning/adherence to lifestyle change: none  Demonstrated degree of understanding via:  Teach Back   Monitoring/Evaluation:  Dietary intake, exercise, mindful eating, and body weight in 2 month(s).

## 2015-04-09 NOTE — Patient Instructions (Addendum)
Focus on the positive to help stay motivated!  Plan to eat out dinner about 1 x week (Tuesdays). Pack sandwiches, yogurt, fruit, vegetables with dips, cheese and crackers, etc. for busy nights. Plan for splurges a few times per week. Continue exercising 3-4 x week (yoga, DVDs, gym).  Consider talking to a counselor about emotional eating/stress management/not giving up on yourself.

## 2015-06-07 ENCOUNTER — Encounter: Payer: 59 | Attending: Obstetrics and Gynecology | Admitting: Dietician

## 2015-06-07 ENCOUNTER — Encounter: Payer: Self-pay | Admitting: Dietician

## 2015-06-07 VITALS — Wt 204.8 lb

## 2015-06-07 DIAGNOSIS — Z713 Dietary counseling and surveillance: Secondary | ICD-10-CM | POA: Insufficient documentation

## 2015-06-07 DIAGNOSIS — E669 Obesity, unspecified: Secondary | ICD-10-CM | POA: Insufficient documentation

## 2015-06-07 DIAGNOSIS — Z6835 Body mass index (BMI) 35.0-35.9, adult: Secondary | ICD-10-CM | POA: Diagnosis not present

## 2015-06-07 NOTE — Progress Notes (Signed)
  Medical Nutrition Therapy:  Appt start time: 1000 end time: 1015.  Assessment:  Primary concerns today: Kady is here today for help with weight loss with no weight change since May. Has settled into new house. Has been busy this summer. Not eating out as much but still eating on the run. Trying to fix meals at home. Having some popcorn, candy, etc with kids. Exercise has been hit or miss. Not eating as many vegetables  Has not been using MyFitnessPal.     Stress level is better than before. Not drinking as many protein shakes (shakeology).   Preferred Learning Style:   No preference indicated   Learning Readiness:   Ready   MEDICATIONS: see list   DIETARY INTAKE:  Avoided foods include brussels spouts, red meat at home, white rice, walnuts, pecans  24-hr recall:  B ( AM): coffee with low fat creamer and artificial sweetener, Cheerios with banana and skim milk or scrambled eggs and [place] bacon with toast or oatmeal with brown sugar and raisins/blueberries or waffles  Snk ( AM): not usually L ( PM): sandwich or lunch meat and cheese with crackers   Snk ( PM): granola bar, fruit, cheese stick D ( PM): burger/hot dogs, pasta, chicken with potatoes and vegetables, tacos Snk ( PM): ice cream, popcorn, candy Beverages: coffee with 1/2 pack of stevia and low fat creamer, water, unsweet tea, milk, soda every other weekend  Usual physical activity: pool, swimming laps (about 2 x week)  Estimated energy needs: 1800 calories 200 g carbohydrates 135 g protein 50 g fat  Progress Towards Goal(s):  In progress.   Nutritional Diagnosis:  [place]-3.3 Overweight/obesity As related to hx of excess snacking, larger portions, and inadequate physical activity.  As evidenced by BMI of 36.6.    Intervention:  Nutrition counseling provided.   Plan: Plan to eat out dinner about 1 x week. Continue to pack sandwiches, yogurt, fruit, vegetables with dips, cheese and crackers, etc. for busy  days/nights. Plan for 2 sweet treats per week. Plan to do tennis on Wednesday night and try running 3 x week. Stop buying candy. Try using portion containers again. Or use small plates at dinner.   Teaching Method Utilized:  Visual Auditory   Barriers to learning/adherence to lifestyle change: none  Demonstrated degree of understanding via:  Teach Back   Monitoring/Evaluation:  Dietary intake, exercise, mindful eating, and body weight in 2 month(s).

## 2015-06-07 NOTE — Patient Instructions (Addendum)
Plan to eat out dinner about 1 x week. Continue to pack sandwiches, yogurt, fruit, vegetables with dips, cheese and crackers, etc. for busy days/nights. Plan for 2 sweet treats per week. Plan to do tennis on Wednesday night and try running 3 x week. Stop buying candy. Try using portion containers again. Or use small plates at dinner.

## 2015-08-09 ENCOUNTER — Encounter: Payer: 59 | Attending: Obstetrics and Gynecology | Admitting: Dietician

## 2015-08-09 ENCOUNTER — Encounter: Payer: Self-pay | Admitting: Dietician

## 2015-08-09 VITALS — Wt 200.9 lb

## 2015-08-09 DIAGNOSIS — Z6835 Body mass index (BMI) 35.0-35.9, adult: Secondary | ICD-10-CM | POA: Insufficient documentation

## 2015-08-09 DIAGNOSIS — Z713 Dietary counseling and surveillance: Secondary | ICD-10-CM | POA: Diagnosis not present

## 2015-08-09 DIAGNOSIS — E669 Obesity, unspecified: Secondary | ICD-10-CM | POA: Insufficient documentation

## 2015-08-09 NOTE — Patient Instructions (Addendum)
Continue to eat out dinner about 1 x week. Continue to plan for 2 sweet treats per week. Continue to do boot camp 4 x week.  Plan to use crock pot when when weather is cooler.  You might want to try 45% carbohydrates, 25% fat, and 30% protein in MyFitnessPal.

## 2015-08-09 NOTE — Progress Notes (Signed)
  Medical Nutrition Therapy:  Appt start time: 0321 end time: 1130  Assessment:  Primary concerns today: Sanora is here today for help with weight loss with a 4 lb weight loss. Clothes are getting looser. Has been using MyFitnessPal again. Started going to bootcamp 4-5 x week. Getting weighed and measured at [facility]. Has been advised by boot camp to follow 45% protein, 20% fat, and 35% carb diet but not able to do that. Not eating out as much as before. Will [employer] sometimes. Getting back into school schedule.   Having protein shakes (shakeology) 1 x day. Scale broke and not planning to buy another one.  Preferred Learning Style:   No preference indicated   Learning Readiness:   Ready   MEDICATIONS: see list   DIETARY INTAKE:  Avoided foods include brussels spouts, red meat at home, white rice, walnuts, pecans  24-hr recall:  B ( AM): coffee with low fat creamer and artificial sweetener, Cheerios with banana and skim milk or scrambled eggs and [place] bacon with toast or oatmeal with brown sugar and raisins/blueberries or waffles  Snk ( AM): not usually L ( PM): sandwich or lunch meat and cheese with crackers   Snk ( PM): granola bar, fruit, cheese stick D ( PM): burger/hot dogs, pasta, chicken with potatoes and vegetables, tacos Snk ( PM): popcorn sometimes Beverages: coffee with 1/2 pack of stevia and low fat creamer, water, unsweet tea, milk, soda every other weekend  Usual physical activity: boot camp 45 minutes 4 x week    Estimated energy needs: 1800 calories 200 g carbohydrates 135 g protein 50 g fat  Progress Towards Goal(s):  In progress.   Nutritional Diagnosis:  [place]-3.3 Overweight/obesity As related to hx of excess snacking, larger portions, and inadequate physical activity.  As evidenced by BMI of 36.6.    Intervention:  Nutrition counseling provided.   Plan: Continue to eat out dinner about 1 x week. Continue to plan for 2 sweet treats per  week. Continue to do boot camp 4 x week.  Plan to use crock pot when when weather is cooler.  You might want to try 45% carbohydrates, 25% fat, and 30% protein in MyFitnessPal.   Teaching Method Utilized:  Visual Auditory  Barriers to learning/adherence to lifestyle change: none  Demonstrated degree of understanding via:  Teach Back   Monitoring/Evaluation:  Dietary intake, exercise, mindful eating, and body weight in 3 month(s).

## 2015-08-10 ENCOUNTER — Encounter: Payer: 59 | Admitting: Neurology

## 2015-08-19 ENCOUNTER — Ambulatory Visit (INDEPENDENT_AMBULATORY_CARE_PROVIDER_SITE_OTHER): Payer: 59 | Admitting: Neurology

## 2015-08-19 ENCOUNTER — Ambulatory Visit (INDEPENDENT_AMBULATORY_CARE_PROVIDER_SITE_OTHER): Payer: Self-pay | Admitting: Neurology

## 2015-08-19 DIAGNOSIS — R202 Paresthesia of skin: Secondary | ICD-10-CM | POA: Diagnosis not present

## 2015-08-19 DIAGNOSIS — Z0289 Encounter for other administrative examinations: Secondary | ICD-10-CM

## 2015-08-19 NOTE — Progress Notes (Signed)
  [facility]  Provider:  Dr Jaynee Eagles Referring Provider: [name], MD Primary Care Physician:  [name], MD   HPI:  Symptoms have persisted since 2009. Her hands go numb at night. Her sensation in the tips of fingers digits 1-4 is decreased. Grip is weak. Hands are cold. Symptoms not progressive.   Summary: Nerve Conduction studies were performed on the bilateral upper extremities.  ADM Ulnar and APB Median motor conductions were within normal limits with normal F wave latencies. 2nd-digit Median, 5th-digit Ulnar sensory conductions were within normal limits.   The bilateral median/ulnar (palm) comparison nerve studies showed normal  peak latency difference (Median Palm-Ulnar Palm)   EMG needle evaluation was performed on selected Bilateral upper extremity muscles: The Deltoid, Triceps, Flexor Digitorum Profundus (ulnar), Pronator Teres, right First Dorsal Interoseous, Opponens Pollicis and right J1/B5 paraspinal muscles were normal.  Conclusion: This is a normal study. No electrophysiologic evidence for median or ulnar neuropathy, polyneuropathy or cervical radiculopathy.

## 2015-08-19 NOTE — Progress Notes (Signed)
See procedure note.

## 2015-08-19 NOTE — Procedures (Signed)
[facility]  Provider:  Dr Jaynee Eagles Referring Provider: [name], MD Primary Care Physician:  [name], MD   HPI:  Symptoms have persisted since 2009. Her hands go numb at night. Her sensation in the tips of fingers digits 1-4 is decreased. Grip is weak. Hands are cold. Symptoms not progressive.   Summary: Nerve Conduction studies were performed on the bilateral upper extremities.  ADM Ulnar and APB Median motor conductions were within normal limits with normal F wave latencies. 2nd-digit Median, 5th-digit Ulnar sensory conductions were within normal limits.   The bilateral median/ulnar (palm) comparison nerve studies showed normal  peak latency difference (Median Palm-Ulnar Palm)   EMG needle evaluation was performed on selected Bilateral upper extremity muscles: The Deltoid, Triceps, Flexor Digitorum Profundus (ulnar), Pronator Teres, right First Dorsal Interoseous, Opponens Pollicis and right R9/X5 paraspinal muscles were normal.  Conclusion: This is a normal study. No electrophysiologic evidence for median or ulnar neuropathy, polyneuropathy or cervical radiculopathy.

## 2015-11-08 ENCOUNTER — Ambulatory Visit: Payer: 59 | Admitting: Dietician

## 2015-11-15 ENCOUNTER — Encounter: Payer: 59 | Attending: Family Medicine | Admitting: Dietician

## 2015-11-15 ENCOUNTER — Encounter: Payer: Self-pay | Admitting: Dietician

## 2015-11-15 VITALS — Wt 198.7 lb

## 2015-11-15 DIAGNOSIS — E669 Obesity, unspecified: Secondary | ICD-10-CM

## 2015-11-15 DIAGNOSIS — Z6834 Body mass index (BMI) 34.0-34.9, adult: Secondary | ICD-10-CM | POA: Diagnosis not present

## 2015-11-15 DIAGNOSIS — Z713 Dietary counseling and surveillance: Secondary | ICD-10-CM | POA: Diagnosis not present

## 2015-11-15 NOTE — Patient Instructions (Addendum)
Continue to do boot camp 4-5 x week.  Get back to tracking regularly in MyFitnessPal. Be picky about treats.  Don't buy ice cream at home (since you'll treats through the holidays).

## 2015-11-15 NOTE — Progress Notes (Signed)
  Medical Nutrition Therapy:  Appt start time: [id number] end time: 1105  Assessment:  Primary concerns today: [name] is here today for help with weight loss with a 2 lb weight loss. Clothes are fitting a lot looser. Going to boot camp 4-5 x week and staying off the scale as much as possible. Feels like she needs to tweak her food a bit. Still using MyFitnessPal. Trying to do 45% carbohydrates, 25% fat, and 30% protein in MyFitnessPal. Feels like she does better when she logs her food.  Husband is following Eat to Live (mostly vegetarian) diet.   Going out to eat about 2 x week the past couple of weeks though has cut back a lot. Has been doing holiday parties (sweets). Went on a cruise in November.   Preferred Learning Style:   No preference indicated   Learning Readiness:   Ready   MEDICATIONS: see list   DIETARY INTAKE:  Avoided foods include brussels spouts, red meat at home, white rice, walnuts, pecans  24-hr recall:  B ( AM): Protein shake Control and instrumentation engineer) with almond milk and banana and workout days, on weekend oatmeal with fruit and coffee with low fat creamer and artificial sweetener Snk ( 1030 AM): boiled eggs and English muffin with [place] sausage or bacon  L ( PM): none or on weekend subway salad with chicken or tuna and no dressing Snk (1 PM): lunch meat and cheese  D ( PM): vegetables/vegetarian dishes such as spaghetti squash with sauce or soup with crackers or bread or fish with vegetables and sweet potatoes or veggie chili  Snk ( PM): popcorn or ice cream sometimes Beverages: coffee with 1/2 pack of stevia and low fat creamer, water, unsweet tea  Usual physical activity: boot camp 45 minutes 4-5 x week    Estimated energy needs: 1800 calories 200 g carbohydrates 135 g protein 50 g fat  Progress Towards Goal(s):  In progress.   Nutritional Diagnosis:  [place]-3.3 Overweight/obesity As related to hx of excess snacking, larger portions, and inadequate physical activity.  As  evidenced by BMI of 36.6.    Intervention:  Nutrition counseling provided.   Plan: Continue to do boot camp 4-5 x week.  Get back to tracking regularly in MyFitnessPal. Be picky about treats.  Don't buy ice cream at home (since you'll treats through the holidays).   Teaching Method Utilized:  Visual Auditory  Barriers to learning/adherence to lifestyle change: none  Demonstrated degree of understanding via:  Teach Back   Monitoring/Evaluation:  Dietary intake, exercise, mindful eating, and body weight in 4 month(s).

## 2015-11-30 ENCOUNTER — Other Ambulatory Visit: Payer: Self-pay | Admitting: Orthopedic Surgery

## 2015-11-30 DIAGNOSIS — M19111 Post-traumatic osteoarthritis, right shoulder: Secondary | ICD-10-CM

## 2015-12-06 ENCOUNTER — Inpatient Hospital Stay: Admission: RE | Admit: 2015-12-06 | Payer: 59 | Source: Ambulatory Visit

## 2015-12-07 ENCOUNTER — Ambulatory Visit
Admission: RE | Admit: 2015-12-07 | Discharge: 2015-12-07 | Disposition: A | Discharge status: Home / Self Care | Payer: 59 | Source: Ambulatory Visit | Attending: Orthopedic Surgery | Admitting: Orthopedic Surgery

## 2015-12-07 DIAGNOSIS — M19111 Post-traumatic osteoarthritis, right shoulder: Secondary | ICD-10-CM

## 2015-12-07 DIAGNOSIS — M25511 Pain in right shoulder: Secondary | ICD-10-CM | POA: Diagnosis not present

## 2015-12-07 DIAGNOSIS — M75101 Unspecified rotator cuff tear or rupture of right shoulder, not specified as traumatic: Secondary | ICD-10-CM | POA: Diagnosis not present

## 2016-01-27 NOTE — H&P (Signed)
  [name] is an 45 y.o. female.    Chief Complaint: right shoulder pain  HPI: Pt is a 45 y.o. female complaining of right shoulder pain for multiple years. Pain had continually increased since the beginning. X-rays in the clinic show ac arthrosis and partial cuff tear. Pt has tried various conservative treatments which have failed to alleviate their symptoms, including injections and therapy. Various options are discussed with the patient. Risks, benefits and expectations were discussed with the patient. Patient understand the risks, benefits and expectations and wishes to proceed with surgery.   PCP:  [name], MD  D/C Plans: Home  PMH: Past Medical History  Diagnosis Date  . GERD (gastroesophageal reflux disease)   . Migraines     [facility]  . History of kidney problems     1975, eval by 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 Depression     followed by [name]    PSH: Past Surgical History  Procedure Laterality Date  . Cesarean section  2007 & 2009  . Tubal ligation    . Cholecystectomy  2009  . Uterine ablation      Social History:  has no tobacco, alcohol, and drug history on file.  Allergies:  No Known Allergies  Medications: No current facility-administered medications for this encounter.   Current Outpatient Prescriptions  Medication Sig Dispense Refill  . buPROPion (WELLBUTRIN XL) 300 MG 24 hr tablet Take 300 mg by mouth daily.    . cyclobenzaprine (FLEXERIL) 10 MG tablet Take 10 mg by mouth 3 (three) times daily as needed for muscle spasms.    Marland Kitchen ibuprofen (ADVIL,MOTRIN) 100 MG tablet Take 100 mg by mouth every 6 (six) hours as needed for fever.      No results found for this or any previous visit (from the past 48 hour(s)). No results  found.  ROS: Pain with rom of the right upper extremity  Physical Exam:  Alert and oriented 45 y.o. 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 at [facility]. Risks benefits and expectations were discussed with the patient. Patient understand risks, benefits and expectations and wishes to proceed.

## 2016-01-31 NOTE — Pre-Procedure Instructions (Signed)
ADLIE BOEHNLEIN  01/31/2016     Your procedure is scheduled on : Friday February 11, 2016 at 7:30 AM.  Report to [facility] Admitting at 5:30 AM.  Call this number if you have problems the morning of surgery: [phone]    Remember:  Do not eat food or drink liquids after midnight.  Take these medicines the morning of surgery with A SIP OF WATER : NONE   Stop taking any vitamins, herbal medications/supplements, Ibuprofen, Advil, Motrin, Aleve, etc on Friday March 10th   Do not wear jewelry, make-up or nail polish.  Do not wear lotions, powders, or perfumes.  You may NOT wear deodorant.  Do not shave 48 hours prior to surgery.    Do not bring valuables to the hospital.  [facility] is not responsible for any belongings or valuables.  Contacts, dentures or bridgework may not be worn into surgery.  Leave your suitcase in the car.  After surgery it may be brought to your room.  For patients admitted to the hospital, discharge time will be determined by your treatment team.  Patients discharged the day of surgery will not be allowed to drive home.   Name and phone number of your driver:    Special instructions:  Shower using CHG soap the night before and the morning of your surgery  Please read over the following fact sheets that you were given. Pain Booklet, Coughing and Deep Breathing, Surgical Site Infection Prevention and Anesthesia Post-op Instructions

## 2016-02-01 ENCOUNTER — Encounter (HOSPITAL_COMMUNITY)
Admission: RE | Admit: 2016-02-01 | Discharge: 2016-02-01 | Disposition: A | Discharge status: Home / Self Care | Payer: 59 | Source: Ambulatory Visit | Attending: Orthopedic Surgery | Admitting: Orthopedic Surgery

## 2016-02-01 ENCOUNTER — Encounter (HOSPITAL_COMMUNITY): Payer: Self-pay

## 2016-02-01 DIAGNOSIS — M19011 Primary osteoarthritis, right shoulder: Secondary | ICD-10-CM | POA: Diagnosis not present

## 2016-02-01 DIAGNOSIS — Z01812 Encounter for preprocedural laboratory examination: Secondary | ICD-10-CM | POA: Insufficient documentation

## 2016-02-01 DIAGNOSIS — M75101 Unspecified rotator cuff tear or rupture of right shoulder, not specified as traumatic: Secondary | ICD-10-CM | POA: Insufficient documentation

## 2016-02-01 LAB — BASIC METABOLIC PANEL
ANION GAP: 10 (ref 5–15)
BUN: 14 mg/dL (ref 6–20)
CALCIUM: 9.6 mg/dL (ref 8.9–10.3)
CO2: 25 mmol/L (ref 22–32)
Chloride: 103 mmol/L (ref 101–111)
Creatinine, Ser: 0.87 mg/dL (ref 0.44–1.00)
GFR calc Af Amer: >60 mL/min (ref >60)
GLUCOSE: 98 mg/dL (ref 65–99)
POTASSIUM: 4 mmol/L (ref 3.5–5.1)
SODIUM: 138 mmol/L (ref 135–145)

## 2016-02-01 LAB — CBC
HCT: 40.4 % (ref 36.0–46.0)
Hemoglobin: 13.7 g/dL (ref 12.0–15.0)
MCH: 31.1 pg (ref 26.0–34.0)
MCHC: 33.9 g/dL (ref 30.0–36.0)
MCV: 91.6 fL (ref 78.0–100.0)
PLATELETS: 200 10*3/uL (ref 150–400)
RBC: 4.41 MIL/uL (ref 3.87–5.11)
RDW: 12.6 % (ref 11.5–15.5)
WBC: 9 10*3/uL (ref 4.0–10.5)

## 2016-02-01 LAB — HCG, SERUM, QUALITATIVE: PREG SERUM: NEGATIVE

## 2016-02-10 MED ORDER — CEFAZOLIN SODIUM-DEXTROSE 2-3 GM-% IV SOLR
2.0000 g | INTRAVENOUS | Status: AC
  Administered 2016-02-11: 2 g via INTRAVENOUS
  Filled 2016-02-10: qty 50

## 2016-02-11 ENCOUNTER — Encounter (HOSPITAL_COMMUNITY): Payer: Self-pay | Admitting: *Deleted

## 2016-02-11 ENCOUNTER — Encounter (HOSPITAL_COMMUNITY)
Admission: RE | Disposition: A | Discharge status: Home / Self Care | Payer: Self-pay | Source: Ambulatory Visit | Attending: Orthopedic Surgery

## 2016-02-11 ENCOUNTER — Ambulatory Visit (HOSPITAL_COMMUNITY): Payer: 59 | Admitting: Anesthesiology

## 2016-02-11 ENCOUNTER — Ambulatory Visit (HOSPITAL_COMMUNITY)
Admission: RE | Admit: 2016-02-11 | Discharge: 2016-02-11 | Disposition: A | Discharge status: Home / Self Care | Payer: 59 | Source: Ambulatory Visit | Attending: Orthopedic Surgery | Admitting: Orthopedic Surgery

## 2016-02-11 DIAGNOSIS — G8918 Other acute postprocedural pain: Secondary | ICD-10-CM | POA: Diagnosis not present

## 2016-02-11 DIAGNOSIS — Z87891 Personal history of nicotine dependence: Secondary | ICD-10-CM | POA: Insufficient documentation

## 2016-02-11 DIAGNOSIS — Z79899 Other long term (current) drug therapy: Secondary | ICD-10-CM | POA: Insufficient documentation

## 2016-02-11 DIAGNOSIS — M75101 Unspecified rotator cuff tear or rupture of right shoulder, not specified as traumatic: Secondary | ICD-10-CM | POA: Insufficient documentation

## 2016-02-11 DIAGNOSIS — S43431D Superior glenoid labrum lesion of right shoulder, subsequent encounter: Secondary | ICD-10-CM | POA: Diagnosis not present

## 2016-02-11 DIAGNOSIS — M7541 Impingement syndrome of right shoulder: Secondary | ICD-10-CM | POA: Diagnosis not present

## 2016-02-11 DIAGNOSIS — M19011 Primary osteoarthritis, right shoulder: Secondary | ICD-10-CM | POA: Diagnosis not present

## 2016-02-11 DIAGNOSIS — Z5333 Arthroscopic surgical procedure converted to open procedure: Secondary | ICD-10-CM | POA: Insufficient documentation

## 2016-02-11 HISTORY — PX: SHOULDER ARTHROSCOPY WITH SUBACROMIAL DECOMPRESSION, ROTATOR CUFF REPAIR AND BICEP TENDON REPAIR: SHX5687

## 2016-02-11 SURGERY — SHOULDER ARTHROSCOPY WITH SUBACROMIAL DECOMPRESSION, ROTATOR CUFF REPAIR AND BICEP TENDON REPAIR
Anesthesia: Regional | Site: Shoulder | Laterality: Right

## 2016-02-11 MED ORDER — SODIUM CHLORIDE 0.9 % IJ SOLN
INTRAMUSCULAR | Status: AC
  Filled 2016-02-11: qty 10

## 2016-02-11 MED ORDER — OXYCODONE HCL 5 MG PO TABS: 5.0000 mg | ORAL_TABLET | Freq: Once | ORAL | Status: DC | PRN

## 2016-02-11 MED ORDER — PROPOFOL 10 MG/ML IV BOLUS
INTRAVENOUS | Status: DC | PRN
  Administered 2016-02-11: 160 mg via INTRAVENOUS

## 2016-02-11 MED ORDER — EPHEDRINE SULFATE 50 MG/ML IJ SOLN
INTRAMUSCULAR | Status: AC
  Filled 2016-02-11: qty 1

## 2016-02-11 MED ORDER — SUCCINYLCHOLINE CHLORIDE 20 MG/ML IJ SOLN
INTRAMUSCULAR | Status: AC
  Filled 2016-02-11: qty 1

## 2016-02-11 MED ORDER — STERILE WATER FOR IRRIGATION IR SOLN
Status: DC | PRN
  Administered 2016-02-11: 50 mL

## 2016-02-11 MED ORDER — GLYCOPYRROLATE 0.2 MG/ML IJ SOLN
INTRAMUSCULAR | Status: AC
  Filled 2016-02-11: qty 2

## 2016-02-11 MED ORDER — NEOSTIGMINE METHYLSULFATE 10 MG/10ML IV SOLN
INTRAVENOUS | Status: DC | PRN
  Administered 2016-02-11: 3 mg via INTRAVENOUS

## 2016-02-11 MED ORDER — BUPIVACAINE-EPINEPHRINE (PF) 0.25% -1:200000 IJ SOLN
INTRAMUSCULAR | Status: AC
  Filled 2016-02-11: qty 30

## 2016-02-11 MED ORDER — BUPIVACAINE-EPINEPHRINE (PF) 0.5% -1:200000 IJ SOLN
INTRAMUSCULAR | Status: DC | PRN
  Administered 2016-02-11: 30 mL via PERINEURAL

## 2016-02-11 MED ORDER — PROPOFOL 10 MG/ML IV BOLUS
INTRAVENOUS | Status: AC
  Filled 2016-02-11: qty 20

## 2016-02-11 MED ORDER — ONDANSETRON HCL 4 MG/2ML IJ SOLN
INTRAMUSCULAR | Status: DC | PRN
  Administered 2016-02-11: 4 mg via INTRAVENOUS

## 2016-02-11 MED ORDER — ARTIFICIAL TEARS OP OINT
TOPICAL_OINTMENT | OPHTHALMIC | Status: AC
  Filled 2016-02-11: qty 3.5

## 2016-02-11 MED ORDER — ONDANSETRON HCL 4 MG/2ML IJ SOLN
INTRAMUSCULAR | Status: AC
  Filled 2016-02-11: qty 2

## 2016-02-11 MED ORDER — ROCURONIUM BROMIDE 50 MG/5ML IV SOLN
INTRAVENOUS | Status: AC
  Filled 2016-02-11: qty 1

## 2016-02-11 MED ORDER — KETOROLAC TROMETHAMINE 30 MG/ML IJ SOLN
INTRAMUSCULAR | Status: AC
  Filled 2016-02-11: qty 1

## 2016-02-11 MED ORDER — EPHEDRINE SULFATE 50 MG/ML IJ SOLN
INTRAMUSCULAR | Status: DC | PRN
  Administered 2016-02-11: 10 mg via INTRAVENOUS

## 2016-02-11 MED ORDER — FENTANYL CITRATE (PF) 250 MCG/5ML IJ SOLN
INTRAMUSCULAR | Status: AC
Start: 2016-02-11 — End: 2016-02-11
  Filled 2016-02-11: qty 5

## 2016-02-11 MED ORDER — OXYCODONE-ACETAMINOPHEN 5-325 MG PO TABS: 1.0000 | ORAL_TABLET | ORAL | Status: DC | PRN

## 2016-02-11 MED ORDER — MIDAZOLAM HCL 2 MG/2ML IJ SOLN
INTRAMUSCULAR | Status: AC
  Filled 2016-02-11: qty 2

## 2016-02-11 MED ORDER — LIDOCAINE HCL (CARDIAC) 20 MG/ML IV SOLN
INTRAVENOUS | Status: AC
  Filled 2016-02-11: qty 5

## 2016-02-11 MED ORDER — KETOROLAC TROMETHAMINE 30 MG/ML IJ SOLN
30.0000 mg | Freq: Once | INTRAMUSCULAR | Status: AC
  Administered 2016-02-11: 30 mg via INTRAVENOUS
  Filled 2016-02-11: qty 1

## 2016-02-11 MED ORDER — ROCURONIUM BROMIDE 100 MG/10ML IV SOLN
INTRAVENOUS | Status: DC | PRN
  Administered 2016-02-11: 50 mg via INTRAVENOUS

## 2016-02-11 MED ORDER — ONDANSETRON HCL 4 MG/2ML IJ SOLN: 4.0000 mg | Freq: Four times a day (QID) | INTRAMUSCULAR | Status: DC | PRN

## 2016-02-11 MED ORDER — BUPIVACAINE-EPINEPHRINE 0.25% -1:200000 IJ SOLN
INTRAMUSCULAR | Status: DC | PRN
  Administered 2016-02-11: 6 mL

## 2016-02-11 MED ORDER — LIDOCAINE HCL (CARDIAC) 20 MG/ML IV SOLN
INTRAVENOUS | Status: DC | PRN
  Administered 2016-02-11: 70 mg via INTRAVENOUS
  Administered 2016-02-11: 30 mg via INTRAVENOUS

## 2016-02-11 MED ORDER — MIDAZOLAM HCL 5 MG/5ML IJ SOLN
INTRAMUSCULAR | Status: DC | PRN
  Administered 2016-02-11: 2 mg via INTRAVENOUS

## 2016-02-11 MED ORDER — METHOCARBAMOL 500 MG PO TABS: 500.0000 mg | ORAL_TABLET | Freq: Three times a day (TID) | ORAL | Status: DC | PRN

## 2016-02-11 MED ORDER — HYDROMORPHONE HCL 1 MG/ML IJ SOLN: 0.2500 mg | INTRAMUSCULAR | Status: DC | PRN

## 2016-02-11 MED ORDER — OXYCODONE HCL 5 MG/5ML PO SOLN: 5.0000 mg | Freq: Once | ORAL | Status: DC | PRN

## 2016-02-11 MED ORDER — GLYCOPYRROLATE 0.2 MG/ML IJ SOLN
INTRAMUSCULAR | Status: DC | PRN
  Administered 2016-02-11: 0.4 mg via INTRAVENOUS

## 2016-02-11 MED ORDER — LACTATED RINGERS IV SOLN
INTRAVENOUS | Status: DC | PRN
  Administered 2016-02-11: 07:00:00 via INTRAVENOUS

## 2016-02-11 MED ORDER — KETOROLAC TROMETHAMINE 10 MG PO TABS: 10.0000 mg | ORAL_TABLET | Freq: Three times a day (TID) | ORAL | Status: DC

## 2016-02-11 MED ORDER — SODIUM CHLORIDE 0.9 % IV SOLN
10.0000 mg | INTRAVENOUS | Status: DC | PRN
  Administered 2016-02-11: 25 ug/min via INTRAVENOUS

## 2016-02-11 MED ORDER — SODIUM CHLORIDE 0.9 % IR SOLN
Status: DC | PRN
  Administered 2016-02-11: 3000 mL

## 2016-02-11 MED ORDER — FENTANYL CITRATE (PF) 100 MCG/2ML IJ SOLN
INTRAMUSCULAR | Status: DC | PRN
  Administered 2016-02-11 (×2): 50 ug via INTRAVENOUS

## 2016-02-11 MED ORDER — ONDANSETRON HCL 4 MG PO TABS: 4.0000 mg | ORAL_TABLET | Freq: Three times a day (TID) | ORAL | Status: DC | PRN

## 2016-02-11 MED FILL — ONDANSETRON HCL 4 MG TABLET: 4 | 6 days supply | Qty: 20 | Fill #0

## 2016-02-11 MED FILL — OXYCODONE/APAP 5/325MG: 5-325 | 5 days supply | Qty: 60 | Fill #0

## 2016-02-11 MED FILL — KETOROLAC 10 MG TABLET: 10 | 3 days supply | Qty: 9 | Fill #0

## 2016-02-11 MED FILL — METHOCARBAMOL 500 MG TABLET: 500 | 20 days supply | Qty: 60 | Fill #0

## 2016-02-11 SURGICAL SUPPLY — 77 items
ANCH SUT 360D 2 2 LD 2 STRN (Anchor) ×2 IMPLANT
ANCHOR ALL- SUT RC 2 SUT Y-K (Anchor) ×4 IMPLANT
ANCHOR ALL-SUT RC 2 SUT Y-K (Anchor) IMPLANT
BLADE LONG MED 31MMX9MM (MISCELLANEOUS)
BLADE LONG MED 31X9 (MISCELLANEOUS) IMPLANT
BLADE SURG 11 STRL SS (BLADE) ×3 IMPLANT
BUR OVAL 4.0 (BURR) ×3 IMPLANT
CLOSURE STERI-STRIP 1/2X4 (GAUZE/BANDAGES/DRESSINGS) ×2
CLOSURE WOUND 1/2 X4 (GAUZE/BANDAGES/DRESSINGS)
CLSR STERI-STRIP ANTIMIC 1/2X4 (GAUZE/BANDAGES/DRESSINGS) ×4 IMPLANT
COVER SURGICAL LIGHT HANDLE (MISCELLANEOUS) ×3 IMPLANT
DRAPE INCISE IOBAN 66X45 STRL (DRAPES) ×3 IMPLANT
DRAPE STERI 35X30 U-POUCH (DRAPES) ×3 IMPLANT
DRAPE U-SHAPE 47X51 STRL (DRAPES) ×3 IMPLANT
DRSG EMULSION OIL 3X3 NADH (GAUZE/BANDAGES/DRESSINGS) ×4 IMPLANT
DRSG PAD ABDOMINAL 8X10 ST (GAUZE/BANDAGES/DRESSINGS) ×3 IMPLANT
DURAPREP 26ML APPLICATOR (WOUND CARE) ×3 IMPLANT
ELECT NDL TIP 2.8 STRL (NEEDLE) ×1 IMPLANT
ELECT NEEDLE TIP 2.8 STRL (NEEDLE) ×3 IMPLANT
ELECT REM PT RETURN 9FT ADLT (ELECTROSURGICAL) ×3
ELECTRODE REM PT RTRN 9FT ADLT (ELECTROSURGICAL) IMPLANT
GAUZE SPONGE 4X4 12PLY STRL (GAUZE/BANDAGES/DRESSINGS) ×1 IMPLANT
GLOVE BIO SURGEON STRL SZ 6 (GLOVE) ×2 IMPLANT
GLOVE BIO SURGEON STRL SZ7 (GLOVE) ×6 IMPLANT
GLOVE BIOGEL PI IND STRL 6.5 (GLOVE) ×1 IMPLANT
GLOVE BIOGEL PI IND STRL 7.5 (GLOVE) ×1 IMPLANT
GLOVE BIOGEL PI INDICATOR 6.5 (GLOVE) ×2
GLOVE BIOGEL PI INDICATOR 7.5 (GLOVE) ×2
GLOVE BIOGEL PI ORTHO PRO 7.5 (GLOVE) ×2
GLOVE BIOGEL PI ORTHO PRO SZ8 (GLOVE) ×2
GLOVE ORTHO TXT STRL SZ7.5 (GLOVE) ×3 IMPLANT
GLOVE PI ORTHO PRO STRL 7.5 (GLOVE) ×1 IMPLANT
GLOVE PI ORTHO PRO STRL SZ8 (GLOVE) ×1 IMPLANT
GLOVE SURG ORTHO 8.5 STRL (GLOVE) ×3 IMPLANT
GOWN STRL REUS W/ TWL LRG LVL3 (GOWN DISPOSABLE) IMPLANT
GOWN STRL REUS W/ TWL XL LVL3 (GOWN DISPOSABLE) ×2 IMPLANT
GOWN STRL REUS W/TWL LRG LVL3 (GOWN DISPOSABLE) ×6
GOWN STRL REUS W/TWL XL LVL3 (GOWN DISPOSABLE) ×6
KIT BASIN OR (CUSTOM PROCEDURE TRAY) ×3 IMPLANT
KIT ROOM TURNOVER OR (KITS) ×3 IMPLANT
MANIFOLD NEPTUNE II (INSTRUMENTS) ×3 IMPLANT
NDL HYPO 25GX1X1/2 BEV (NEEDLE) ×1 IMPLANT
NDL SPNL 18GX3.5 QUINCKE PK (NEEDLE) ×1 IMPLANT
NDL SUT 6 .5 CRC .975X.05 MAYO (NEEDLE) IMPLANT
NEEDLE HYPO 25GX1X1/2 BEV (NEEDLE) ×3 IMPLANT
NEEDLE MAYO TAPER (NEEDLE) ×9
NEEDLE SPNL 18GX3.5 QUINCKE PK (NEEDLE) ×3 IMPLANT
NS IRRIG 1000ML POUR BTL (IV SOLUTION) ×3 IMPLANT
PACK SHOULDER (CUSTOM PROCEDURE TRAY) ×3 IMPLANT
PAD ARMBOARD 7.5X6 YLW CONV (MISCELLANEOUS) ×6 IMPLANT
RESECTOR FULL RADIUS 4.2MM (BLADE) ×3 IMPLANT
SET ARTHROSCOPY TUBING (MISCELLANEOUS) ×6
SET ARTHROSCOPY TUBING LN (MISCELLANEOUS) ×2 IMPLANT
SLING ARM LRG ADULT FOAM STRAP (SOFTGOODS) ×3 IMPLANT
SLING ARM MED ADULT FOAM STRAP (SOFTGOODS) IMPLANT
SPONGE GAUZE 4X4 12PLY STER LF (GAUZE/BANDAGES/DRESSINGS) ×2 IMPLANT
SPONGE LAP 4X18 X RAY DECT (DISPOSABLE) ×3 IMPLANT
STRIP CLOSURE SKIN 1/2X4 (GAUZE/BANDAGES/DRESSINGS) ×1 IMPLANT
SUCTION FRAZIER HANDLE 10FR (MISCELLANEOUS) ×2
SUCTION TUBE FRAZIER 10FR DISP (MISCELLANEOUS) ×1 IMPLANT
SUT BONE WAX W31G (SUTURE) ×2 IMPLANT
SUT FIBERWIRE #2 38 T-5 BLUE (SUTURE) ×6
SUT HI-FI 2 STRAND C-2 40 (SUTURE) ×2 IMPLANT
SUT MNCRL AB 4-0 PS2 18 (SUTURE) ×3 IMPLANT
SUT VIC AB 0 CT1 27 (SUTURE) ×3
SUT VIC AB 0 CT1 27XBRD ANBCTR (SUTURE) ×1 IMPLANT
SUT VIC AB 0 CT2 27 (SUTURE) ×3 IMPLANT
SUT VIC AB 2-0 CT1 27 (SUTURE) ×3
SUT VIC AB 2-0 CT1 TAPERPNT 27 (SUTURE) ×1 IMPLANT
SUTURE FIBERWR #2 38 T-5 BLUE (SUTURE) IMPLANT
SYR CONTROL 10ML LL (SYRINGE) ×3 IMPLANT
TOWEL OR 17X24 6PK STRL BLUE (TOWEL DISPOSABLE) ×3 IMPLANT
TOWEL OR 17X26 10 PK STRL BLUE (TOWEL DISPOSABLE) ×3 IMPLANT
TUBE CONNECTING 12'X1/4 (SUCTIONS) ×1
TUBE CONNECTING 12X1/4 (SUCTIONS) ×2 IMPLANT
WAND HAND CNTRL MULTIVAC 90 (MISCELLANEOUS) ×3 IMPLANT
WATER STERILE IRR 1000ML POUR (IV SOLUTION) ×3 IMPLANT

## 2016-02-11 NOTE — Interval H&P Note (Signed)
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 After consideration of risks, benefits and other options for treatment, the patient has consented to  Procedure(s): RIGHT SHOULDER ARTHROSCOPY WITH SUBACROMIAL DECOMPRESSION, OPEN DCR POSS BICEP TENODESIS AND POSSIBLE ROTATOR CUFF REPAIR  (Right) as a surgical intervention .  The patient's history has been reviewed, patient examined, no change in status, stable for surgery.  I have reviewed the patient's chart and labs.  Questions were answered to the patient's satisfaction.     Licet Dunphy,STEVEN R

## 2016-02-11 NOTE — Anesthesia Procedure Notes (Addendum)
Anesthesia Regional Block:  Interscalene brachial plexus block  Pre-Anesthetic Checklist: ,, timeout performed, Correct Patient, Correct Site, Correct Laterality, Correct Procedure, Correct Position, site marked, Risks and benefits discussed,  Surgical consent,  Pre-op evaluation,  At surgeon's request and post-op pain management  Laterality: Right  Prep: chloraprep       Needles:  Injection technique: Single-shot  Needle Type: Echogenic Stimulator Needle     Needle Length: 5cm 5 cm Needle Gauge: 22 and 22 G    Additional Needles:  Procedures: ultrasound guided (picture in chart) and nerve stimulator Interscalene brachial plexus block  Nerve Stimulator or Paresthesia:  Response: biceps flexion, 0.45 mA,   Additional Responses:   Narrative:  Start time: 02/11/2016 7:03 AM End time: 02/11/2016 7:09 AM Injection made incrementally with aspirations every 5 mL.  Performed by: Personally  Anesthesiologist: HODIERNE, ADAM  Additional Notes: Functioning IV was confirmed and monitors were applied.  A 42m 22ga Arrow echogenic stimulator needle was used. Sterile prep and drape,hand hygiene and sterile gloves were used.  Negative aspiration and negative test dose prior to incremental administration of local anesthetic. The patient tolerated the procedure well.  Ultrasound guidance: relevent anatomy identified, needle position confirmed, local anesthetic spread visualized around nerve(s), vascular puncture avoided.  Image printed for medical record.    Procedure Name: Intubation Date/Time: 02/11/2016 7:42 AM Performed by: WKyung RuddPre-anesthesia Checklist: Patient identified, Emergency Drugs available, Suction available, Patient being monitored and Timeout performed Patient Re-evaluated:Patient Re-evaluated prior to inductionOxygen Delivery Method: Circle system utilized Preoxygenation: Pre-oxygenation with 100% oxygen Intubation Type: IV induction Ventilation: Mask ventilation  without difficulty Laryngoscope Size: Mac and 3 Grade View: Grade I Tube type: Oral Tube size: 7.0 mm Number of attempts: 1 Airway Equipment and Method: Stylet and LTA kit utilized Placement Confirmation: ETT inserted through vocal cords under direct vision,  positive ETCO2 and breath sounds checked- equal and bilateral Secured at: 21 cm Tube secured with: Tape Dental Injury: Teeth and Oropharynx as per pre-operative assessment

## 2016-02-11 NOTE — Anesthesia Preprocedure Evaluation (Addendum)
Anesthesia Evaluation  Patient identified by MRN, date of birth, ID band Patient awake    Reviewed: Allergy & Precautions, NPO status , Patient's Chart, lab work & pertinent test results  Airway Mallampati: II   Neck ROM: full    Dental  (+) Teeth Intact   Pulmonary former smoker,    breath sounds clear to auscultation       Cardiovascular negative cardio ROS   Rhythm:regular Rate:Normal     Neuro/Psych  Headaches, Depression    GI/Hepatic GERD  ,  Endo/Other  obese  Renal/GU      Musculoskeletal   Abdominal   Peds  Hematology   Anesthesia Other Findings   Reproductive/Obstetrics                            Anesthesia Physical Anesthesia Plan  ASA: II  Anesthesia Plan: General and Regional   Post-op Pain Management: MAC Combined w/ Regional for Post-op pain   Induction: Intravenous  Airway Management Planned: Oral ETT  Additional Equipment:   Intra-op Plan:   Post-operative Plan: Extubation in OR  Informed Consent: I have reviewed the patients History and Physical, chart, labs and discussed the procedure including the risks, benefits and alternatives for the proposed anesthesia with the patient or authorized representative who has indicated his/her understanding and acceptance.     Plan Discussed with: CRNA, Anesthesiologist and Surgeon  Anesthesia Plan Comments:         Anesthesia Quick Evaluation

## 2016-02-11 NOTE — Op Note (Signed)
NAMEKELIN, BOZEK              ACCOUNT NO.:  [id number]  MEDICAL RECORD NO.:  [id number]  LOCATION:  [facility]                         FACILITY:  [facility]  PHYSICIAN:  Doran Heater. Veverly Fells, M.D. DATE OF BIRTH:  01-16-1971  DATE OF PROCEDURE:  02/11/2016 DATE OF DISCHARGE:                              OPERATIVE REPORT   PREOPERATIVE DIAGNOSES:  Right shoulder pain secondary to rotator cuff tear, possible SLAP tear and AC joint arthritis.  POSTOPERATIVE DIAGNOSES: 1. Right shoulder rotator cuff tear. 2. No evidence of SLAP tear. 3. Anterior labral tear. 4. Impingement syndrome. 5. Symptomatic AC joint arthritis.  PROCEDURE PERFORMED:  Right shoulder arthroscopy with limited debridement of anterior labral tear, arthroscopic subacromial decompression followed by mini-open rotator cuff repair and open distal clavicle resection.  ATTENDING SURGEON:  Doran Heater. Veverly Fells, MD.  ASSISTANT:  Abbott Pao. Dixon, PA-C, who has scrubbed the entire procedure and necessary for satisfactory completion of surgery.  ANESTHESIA:  General anesthesia was used plus interscalene block.  ESTIMATED BLOOD LOSS:  Minimal.  FLUID REPLACEMENT:  1200 mL crystalloid.  INSTRUMENT COUNTS:  Correct.  COMPLICATIONS:  There were no complications.  ANTIBIOTICS:  Perioperative antibiotics were given.  INDICATIONS:  The patient is a 45 year old female with worsening right shoulder pain secondary to an MRI documented rotator cuff tear and advanced AC arthritis, there is possibly a SLAP tear, based on degenerative labral findings, patient has failed conservative management over extended period of time including injections, modification, activity, anti-inflammatories, presents for operative treatment to restore function and eliminate pain and informed consent obtained.  DESCRIPTION OF PROCEDURE:  After an adequate level of anesthesia achieved, the patient was positioned in a modified beach-chair position. Right shoulder  correctly identified and examined under anesthesia.  We did a time-out, correct patient and correct site verified.  We examined the patient's shoulder, which revealed full passive range of motion with no undue stiffness, no instability.  After sterile prep and drape of the shoulder and arm, we entered the shoulder used standard portals including anterior, posterior, and lateral portals.  We identified significant anterior labral tearing.  There were 2 separate flap fragments of the anterior labrum, we debrided those using suction shaver.  Fortunately, superior labrum biceps anchor was completely stable.  We visualized the labrum from the anterior and posterior portals.  Subscapularis told as normal.  Rotator cuff visualized from the intraarticular side appeared normal including supraspinatus, infraspinatus, and teres minor.  Articular cartilage normal.  No loose bodies encountered.  We placed a scope in subacromial space.  Thorough bursectomy was performed.  There was quite a bit of hypertrophic bursa noted, hyperemic bursa.  We also used the ArthroCare wand to remove __________ acromion and did very minimal acromioplasty just anteriorly and laterally to create type 1 acromion shape with a butcher block technique utilizing a high-speed bur.  Rotator cuff what appeared to be a near full-thickness tear from the bursal surface was spared at the posterior, supraspinatus, and infraspinatus junction.  At this point having identified the rotator cuff tearing completed the bursectomy and acromioplasty, we concluded the arthroscopic portion of procedure.  We then made a small saber incision overlying the AC joint.  Dissection was  down through subcutaneous tissues.  We identified the deltotrapezial fascia and divided in line with distal clavicle.  Subperiosteal dissection of distal clavicle performed followed by excision of distal 2 to 3 mm of bone using an oscillating saw, we thoroughly irrigated  the AC interval and applied bone wax to the cut end of the clavicle.  I verified anterior, posterior, AC ligaments intact.  We removed dorsal osteophytes off the acromion at the [facility] joint margin and also hypertrophied capsule.  We had nice decompression at joint.  We irrigated thoroughly again and then closed the deltotrapezial fascia with interrupted 0 Vicryl suture followed by 2-0 Vicryl subcutaneous closure and 4-0 Monocryl for skin.  We then addressed the rotator cuff tear through a mini-open incision starting at the anterolateral border of the acromion extending just about 3 to 4 cm.  Dissection was down through subcutaneous tissues, identified the deltoid per se between the anterior and lateral hubs of the deltoid, divided that using needle-tip Bovie, placed Arthrex retractor, and identified the rotator cuff tear. This was a bursal surface tear, Z-shape tear with minimal retraction. We 1st completed the tear using a 15-blade scalpel releasing the remaining tendon, which might have been just 10% to 15% of the tendon on the joint side of the rotator cuff footprint.  We then prepared the greater tuberosity with bur and curette and rongeur.  We mobilized the tendon with a cuff grasper and Cobb elevator.  We were able to fully reduce the tendon back into its anatomic position.  I placed a total of four #2 Hi-Fi sutures in the free edges of tendon, full-thickness suture passes.  We then placed 2 Y-Knot RC anchors at the articular margin of the greater tuberosity and brought those sutures up in a double mattress fashion with a #2 Hi-Fi at the medial portion of the footprint.  We then took the four sutures out laterally down through drill holes in the bone and a bone bridge.  We tied this over the lateral humerus.  We had a nice full double row repair very strong, we ranged the shoulder, we had a watertight closure.  No dog ears to prominent areas.  No impingement with full motion.   Thoroughly irrigated the subdeltoid interval.  We then repaired the deltoid anatomically with 0 Vicryl suture followed by 2-0 Vicryl subcutaneous closure and 4-0 Monocryl for skin and portals. Steri-Strips applied followed by sterile dressing.  The patient tolerated the surgery well.     Doran Heater. Veverly Fells, M.D.     SRN/MEDQ  D:  02/11/2016  T:  02/11/2016  Job:  [id number]

## 2016-02-11 NOTE — Transfer of Care (Signed)
Immediate Anesthesia Transfer of Care Note  Patient: [name]  Procedure(s) Performed: Procedure(s): RIGHT SHOULDER ARTHROSCOPY WITH SUBACROMIAL DECOMPRESSION, OPEN DCR,  OPEN MINI ROTATOR CUFF REPAIR, LABRAL REPAIR (Right)  Patient Location: PACU  Anesthesia Type:GA combined with regional for post-op pain  Level of Consciousness: awake, alert  and oriented  Airway & Oxygen Therapy: Patient Spontanous Breathing and Patient connected to nasal cannula oxygen  Post-op Assessment: Report given to RN, Post -op Vital signs reviewed and stable and Patient moving all extremities  Post vital signs: Reviewed and stable  Last Vitals:  Filed Vitals:   02/11/16 0621  BP: 106/66  Pulse: 68  Resp: 18    Complications: No apparent anesthesia complications

## 2016-02-11 NOTE — Brief Op Note (Signed)
02/11/2016  10:06 AM  PATIENT:  [name]  45 y.o. female  PRE-OPERATIVE DIAGNOSIS:  RIGHT SHOULDER AC ARTHRITIS AND PARTIAL CUFF TEAR   POST-OPERATIVE DIAGNOSIS:  RIGHT SHOULDER AC ARTHRITIS AND PARTIAL CUFF TEAR, LABRAL TEAR  PROCEDURE:  Procedure(s): RIGHT SHOULDER ARTHROSCOPY WITH SUBACROMIAL DECOMPRESSION, OPEN DCR,  OPEN MINI ROTATOR CUFF REPAIR, LABRAL REPAIR (Right)  SURGEON:  Surgeon(s) and Role:    * Netta Cedars, MD - Primary  PHYSICIAN ASSISTANT:   ASSISTANTS: Ventura Bruns, PA-C   ANESTHESIA:   regional and general  EBL:  Total I/O In: -  Out: 75 [Blood:75]  BLOOD ADMINISTERED:none  DRAINS: none   LOCAL MEDICATIONS USED:  MARCAINE     SPECIMEN:  No Specimen  DISPOSITION OF SPECIMEN:  N/A  COUNTS:  YES  TOURNIQUET:  * No tourniquets in log *  DICTATION: .Other Dictation: Dictation Number [id number]  PLAN OF CARE: Discharge to home after PACU  PATIENT DISPOSITION:  PACU - hemodynamically stable.   Delay start of Pharmacological VTE agent (>24hrs) due to surgical blood loss or risk of bleeding: not applicable

## 2016-02-11 NOTE — Discharge Instructions (Signed)
Ice to the shoulder constantly.  Keep the arm in the sling while out of the house.  In the house lean to the right side to allow the arm to drift away from the body with the pendulum effect. Remove the sling carefully. Then slide a pillow up under the right arm and "hug a pillow."  It is best to sleep propped up in a recliner or in the corner of a couch.  Do the exercises every hour while awake, start today.  Do them for 5 minutes (see below)  Pendulums, Lap or pillow slides, Door hinge rotation exercises (hug your stomach, then rotate the thumb out and hitch hike out to the side)  Hug then HitchHike,  Also do assisted forward elevation - use the left arm to lift the right arm up in front of you. This one is easiest laying back in the recliner or in bed.  Keep the incision clean and dry and covered for 5 days then ok to get wet in the shower. Change to Band Aids on Monday from the big bulky bandage.  Follow up in two weeks in the office.  [phone]

## 2016-02-11 NOTE — Progress Notes (Signed)
Orthopedic Tech Progress Note Patient Details:  [name] 1971/09/28 [id number]  Ortho Devices Type of Ortho Device: Shoulder abduction pillow Ortho Device/Splint Interventions: Application   [place] Pink 02/11/2016, 12:33 PM

## 2016-02-14 ENCOUNTER — Encounter (HOSPITAL_COMMUNITY): Payer: Self-pay | Admitting: Orthopedic Surgery

## 2016-02-14 DIAGNOSIS — M25511 Pain in right shoulder: Secondary | ICD-10-CM | POA: Diagnosis not present

## 2016-02-15 NOTE — Anesthesia Postprocedure Evaluation (Signed)
Anesthesia Post Note  Patient: [name]  Procedure(s) Performed: Procedure(s) (LRB): RIGHT SHOULDER ARTHROSCOPY WITH SUBACROMIAL DECOMPRESSION, OPEN DCR,  OPEN MINI ROTATOR CUFF REPAIR, LABRAL REPAIR (Right)  Patient location during evaluation: PACU Anesthesia Type: General Level of consciousness: awake and alert and patient cooperative Pain management: pain level controlled Vital Signs Assessment: post-procedure vital signs reviewed and stable Respiratory status: spontaneous breathing and respiratory function stable Cardiovascular status: stable Anesthetic complications: no    Last Vitals:  Filed Vitals:   02/11/16 1100 02/11/16 1115  BP: 101/65 96/68  Pulse: 64 63  Temp:  36.8 C  Resp: 21 17    Last Pain: There were no vitals filed for this visit.               [place]

## 2016-02-25 ENCOUNTER — Ambulatory Visit: Payer: 59 | Attending: Orthopedic Surgery | Admitting: Physical Therapy

## 2016-02-25 DIAGNOSIS — M25511 Pain in right shoulder: Secondary | ICD-10-CM | POA: Diagnosis not present

## 2016-02-25 DIAGNOSIS — R600 Localized edema: Secondary | ICD-10-CM | POA: Insufficient documentation

## 2016-02-25 DIAGNOSIS — M25611 Stiffness of right shoulder, not elsewhere classified: Secondary | ICD-10-CM | POA: Insufficient documentation

## 2016-02-25 DIAGNOSIS — R29898 Other symptoms and signs involving the musculoskeletal system: Secondary | ICD-10-CM | POA: Insufficient documentation

## 2016-02-25 NOTE — Patient Instructions (Signed)
    Shoulder blade retractions:  Think down and back  10x every few hours  Stretch right upper trap by side bending left, hold 20 sec 3x   Gripping with biceps tension 10x   Keep icing.      Ruben Im PT [facility] [address] [place], [place] [address] Phone # [phone] Fax [phone]

## 2016-02-25 NOTE — Therapy (Signed)
[facility] [address], [place], [place], [address] Phone: [phone]   Fax:  [phone]  Physical Therapy Evaluation  Patient Details  Name: [name] MRN: [id number] Date of Birth: 1970/12/20 Referring Provider: [name]  Encounter Date: 02/25/2016      PT End of Session - 02/25/16 1100    Visit Number 1   Number of Visits 16   Date for PT Re-Evaluation 04/21/16   Authorization Type [facility]   PT Start Time 1015   PT Stop Time 1110   PT Time Calculation (min) 55 min   Activity Tolerance Patient tolerated treatment well      Past Medical History  Diagnosis Date  . GERD (gastroesophageal reflux disease)   . Migraines     [facility]  . History of kidney problems     1975, eval by 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 Depression     followed by [name]    Past Surgical History  Procedure Laterality Date  . Cesarean section  2007 & 2009  . Tubal ligation    . Cholecystectomy  2009  . Uterine ablation    . Cystoscopy  age 45  . Shoulder arthroscopy with subacromial decompression, rotator cuff repair and bicep tendon repair Right 02/11/2016    Procedure: RIGHT SHOULDER ARTHROSCOPY WITH SUBACROMIAL DECOMPRESSION, OPEN DCR,  OPEN MINI ROTATOR CUFF REPAIR, LABRAL REPAIR;  Surgeon: Netta Cedars, MD;  Location: [facility];  Service: Orthopedics;  Laterality: Right;    There were no vitals filed for this visit.  Visit Diagnosis:  Pain in right shoulder - Plan: PT plan of care cert/re-cert  Localized edema - Plan: PT plan of care cert/re-cert  Stiffness of right shoulder, not elsewhere classified - Plan: PT plan of care cert/re-cert  Shoulder weakness - Plan: PT plan of care cert/re-cert       Subjective Assessment - 02/25/16 1017    Subjective 3 year history of right shoulder pain.  Right rotator cuff surgery 02/11/16.  Pain controlled with OTC and muscle relaxers.  Difficulty sleeping (propped in bed).  OK to sleep out of sling.  OK to be without sling at home.  Sling when out of the house.  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  The kids helping with exercise PROM.     Patient Stated Goals Back to work; [employer] and [employer]   Currently in Pain? Yes   Pain Score 0-No pain  3-4/10 with movement   Pain Location Shoulder   Pain Orientation Right   Pain Type Surgical pain   Pain Onset 1 to 4 weeks ago   Pain Frequency Intermittent   Aggravating Factors  any movement             [facility] PT Assessment - 02/25/16 0001    Assessment   Medical Diagnosis right rotator surgery 02/11/16   Referring Provider [name]   Onset Date/Surgical Date 02/11/16   Hand Dominance Right   Next MD Visit 03/17/16   Prior Therapy pre-op   Precautions   Precautions Shoulder   Type of Shoulder Precautions large rotator cuff tear   Required Braces or Orthoses Sling  when out of the house   Restrictions   Weight Bearing Restrictions No  Balance Screen   Has the patient fallen in the past 6 months No   Has the patient had a decrease in activity level because of a fear of falling?  No   Is the patient reluctant to leave their home because of a fear of falling?  No   Home Ecologist residence   Living Arrangements Spouse/significant other;Children   Prior Function   Level of Independence Independent   Vocation Part time employment   Engineer, mining   Leisure play with kids, [place] 4-5 x/week   Observation/Other Assessments   Focus on Therapeutic Outcomes (FOTO)  66% limitation   AROM   AROM Assessment Site Shoulder  decreased left side bending   Right/Left Shoulder Right;Left   Right Shoulder Extension --  Right NT secondary to surgical precautions    Left Shoulder Flexion --  WNLs   PROM   PROM Assessment Site Shoulder   Right/Left Shoulder Right   Right Shoulder Extension 0 Degrees   Right Shoulder Flexion 90 Degrees   Right Shoulder Internal Rotation 0 Degrees   Right Shoulder External Rotation 20 Degrees   Strength   Strength Assessment Site --  N/T secondary to rotator precautions                   [facility] Adult PT Treatment/Exercise - 02/25/16 0001    Electrical Stimulation   Electrical Stimulation Location right deltoids   Electrical Stimulation Action Russian 10/20   Electrical Stimulation Parameters 15 min   Electrical Stimulation Goals Pain   Manual Therapy   Manual therapy comments PROM elevation in scaption 20x; ER to 20 degrees                PT Education - 02/25/16 1155    Education provided Yes   Education Details low level "sling" ex's   Person(s) Educated Patient   Methods Explanation;Demonstration;Handout   Comprehension Verbalized understanding;Returned demonstration          PT Short Term Goals - 02/25/16 1206    PT SHORT TERM GOAL #1   Title The patient will have passive  right shoulder elevation to 125 degrees needed for preparation for AROM  when appropriate with surgical healing 03/24/16   Time 4   Period Weeks   Status New   PT SHORT TERM GOAL #2   Title Active or active-assisted right shoulder elevation to 110 degrees needed for dressing and low level reaching   Time 4   Period Weeks   Status New   PT SHORT TERM GOAL #3   Title The patient will have ER to 45 degrees needed for driving   Time 4   Period Weeks   Status New   PT SHORT TERM GOAL #4   Title The patient will report decreased pain by 25% for improved sleep   Time 4   Period Weeks   Status New           PT Long Term Goals - 02/25/16 1210    PT LONG TERM GOAL #1   Title The patient will be independent in safe, self progression of HEP for further strengthening of right shoulder   04/21/16   Time 8    Period Weeks   Status New   PT LONG TERM GOAL #2   Title The patient will have 150 degrees of elevation overhead for reaching higher shelves at work and home   Time 8   Period Weeks  Status New   PT LONG TERM GOAL #3   Title The patient will have shoulder and scapular muscle strength to grossly 4/5 needed for lifting light to medium objects at home and work.     Time 8   Period Weeks   Status New   PT LONG TERM GOAL #4   Title The patient will have improved ER to 70 degrees needed for taking care of her children and work duties as a Marine scientist   Time 8   Period Weeks   Status New   PT [place] #5   Title Overall pain level < or = to 3/10 with home and work ADLS   Time 8   Period Weeks   Status New   Additional Long Term Goals   Additional Long Term Goals Yes   PT LONG TERM GOAL #6   Title FOTO functional outcome score improved from 66% limitation to 33% indicating improved function with less pain   Time 8   Period Weeks   Status New               Plan - 02/25/16 1157    Clinical Impression Statement The patient has a 3 year history of right shoulder pain.  She is s/p right rotator cuff tear (almost full thickness) repair, SAD and labral debridement  on 02/11/16 (low complexity evaluation).   Glenohumeral region edema.  Decreased upper trap muscle length.  PROM right shoulder 90, ER 30 degrees.  Full elbow flexion and extension.  AROM and MMT not assesed secondary to surgical precautions.     Pt will benefit from skilled therapeutic intervention in order to improve on the following deficits Decreased range of motion;Decreased strength;Hypomobility;Increased edema;Pain;Increased muscle spasms;Impaired UE functional use   Rehab Potential Good   PT Frequency 2x / week   PT Duration 8 weeks   PT Treatment/Interventions ADLs/Self Care Home Management;Cryotherapy;Electrical Stimulation;Therapeutic exercise;Patient/family education;Taping;Vasopneumatic Device;Manual  techniques;Ultrasound;Neuromuscular re-education   PT Next Visit Plan PROM right shoulder;  NMES to decrease muscle atrophy form immobilization; gripping; elbow ROM;  deltoid isometrics in 1 week         Problem List There are no active problems to display for this patient.   [name] 02/25/2016, 12:20 PM  [facility] [address] [place], [place], [address] Phone: [phone]   Fax:  [phone]  Name: GLENDA FALLS MRN: [id number] Date of Birth: [birth year]   Ruben Im, PT 02/25/2016 12:20 PM Phone: [phone] Fax: [phone]

## 2016-02-28 DIAGNOSIS — H52223 Regular astigmatism, bilateral: Secondary | ICD-10-CM | POA: Diagnosis not present

## 2016-02-28 DIAGNOSIS — H5203 Hypermetropia, bilateral: Secondary | ICD-10-CM | POA: Diagnosis not present

## 2016-02-29 ENCOUNTER — Ambulatory Visit: Payer: 59 | Attending: Orthopedic Surgery | Admitting: Physical Therapy

## 2016-02-29 DIAGNOSIS — M6281 Muscle weakness (generalized): Secondary | ICD-10-CM | POA: Insufficient documentation

## 2016-02-29 DIAGNOSIS — R600 Localized edema: Secondary | ICD-10-CM | POA: Diagnosis not present

## 2016-02-29 DIAGNOSIS — M25511 Pain in right shoulder: Secondary | ICD-10-CM | POA: Diagnosis not present

## 2016-02-29 DIAGNOSIS — M25611 Stiffness of right shoulder, not elsewhere classified: Secondary | ICD-10-CM | POA: Diagnosis not present

## 2016-02-29 NOTE — Therapy (Signed)
[facility] [address] [place], [place], [address] Phone: [phone]   Fax:  [phone]  Physical Therapy Treatment  Patient Details  Name: [name] MRN: [id number] Date of Birth: 11-11-1971 Referring Provider: [name]  Encounter Date: 02/29/2016      PT End of Session - 02/29/16 1137    Visit Number 2   Number of Visits 16   Date for PT Re-Evaluation 04/21/16   Authorization Type [facility] UMR   PT Start Time 1100   PT Stop Time 1150   PT Time Calculation (min) 50 min   Activity Tolerance Patient tolerated treatment well      Past Medical History  Diagnosis Date  . GERD (gastroesophageal reflux disease)   . Migraines     [facility]  . History of kidney problems     1975, eval by 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 Depression     followed by [name]    Past Surgical History  Procedure Laterality Date  . Cesarean section  2007 & 2009  . Tubal ligation    . Cholecystectomy  2009  . Uterine ablation    . Cystoscopy  age 83  . Shoulder arthroscopy with subacromial decompression, rotator cuff repair and bicep tendon repair Right 02/11/2016    Procedure: RIGHT SHOULDER ARTHROSCOPY WITH SUBACROMIAL DECOMPRESSION, OPEN DCR,  OPEN MINI ROTATOR CUFF REPAIR, LABRAL REPAIR;  Surgeon: Netta Cedars, MD;  Location: [place];  Service: Orthopedics;  Laterality: Right;    There were no vitals filed for this visit.  Visit Diagnosis:  Pain in right shoulder  Stiffness of right shoulder, not elsewhere classified  Muscle weakness (generalized)  Localized edema      Subjective Assessment - 02/29/16 1101    Subjective No pain just tightness across pain and neck.  A little spasms.     Currently in Pain?  No/denies   Pain Score 0-No pain   Pain Location Shoulder   Pain Orientation Right                         [facility] Adult PT Treatment/Exercise - 02/29/16 0001    Shoulder Exercises: Seated   Extension --  seated thoracic extension with ball (arm supported) 10x   Retraction AROM;Right;5 reps   Other Seated Exercises scapular protraction and retraction 10x   Other Seated Exercises levator scap stretch with ischemic compression on muscle , self overpressure and exhale   Electrical Stimulation   Electrical Stimulation Location right deltoids   Electrical Stimulation Action Russian 10/20 with gripping and biceps isometrci   Electrical Stimulation Parameters 15 min   Electrical Stimulation Goals Neuromuscular facilitation   Manual Therapy   Manual Therapy Soft tissue mobilization   Manual therapy comments PROM elevation in scaption 20x; ER to 20 degrees   Soft tissue mobilization right upper trap and levator, rhomboids                  PT Short Term Goals - 02/29/16 1723    PT SHORT TERM GOAL #1   Title The patient will have passive  right shoulder elevation to 125 degrees needed for preparation for AROM  when appropriate with surgical healing 03/24/16   Time 4  Period Weeks   Status On-going   PT SHORT TERM GOAL #2   Title Active or active-assisted right shoulder elevation to 110 degrees needed for dressing and low level reaching   Time 4   Period Weeks   Status On-going   PT SHORT TERM GOAL #3   Title The patient will have ER to 45 degrees needed for driving   Time 4   Period Weeks   Status On-going   PT SHORT TERM GOAL #4   Title The patient will report decreased pain by 25% for improved sleep   Time 4   Period Weeks   Status On-going           PT Long Term Goals - 02/29/16 1723    PT LONG TERM GOAL #1   Title The patient will be independent in safe, self progression of HEP for further strengthening of right shoulder   04/21/16   Time 8    Period Weeks   Status On-going   PT LONG TERM GOAL #2   Title The patient will have 150 degrees of elevation overhead for reaching higher shelves at work and home   Time 8   Period Weeks   Status On-going   PT LONG TERM GOAL #3   Title The patient will have shoulder and scapular muscle strength to grossly 4/5 needed for lifting light to medium objects at home and work.     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  Incisions appear to be healing well.  Therapist closely monitoring for adherence to rotator cuff precautions.  Some stiffness passively at 90 degrees, improved following treatment interventions.     PT Next Visit Plan PROM right shoulder;  NMES to decrease muscle atrophy form immobilization; gripping; elbow ROM;  deltoid isometrics next visit;  soft tissue mob to levator scap         Problem List There are no active problems to display for this patient.   [name] 02/29/2016, 5:26 PM  [facility] [address], [place], [place], [address] Phone: [phone]   Fax:  [phone]  Name: [name] MRN: [id number] Date of Birth: [birth year]    Ruben Im, PT 02/29/2016 5:26 PM Phone: [phone] Fax: [phone]

## 2016-03-03 ENCOUNTER — Ambulatory Visit: Payer: 59 | Admitting: Physical Therapy

## 2016-03-03 DIAGNOSIS — M25611 Stiffness of right shoulder, not elsewhere classified: Secondary | ICD-10-CM | POA: Diagnosis not present

## 2016-03-03 DIAGNOSIS — M6281 Muscle weakness (generalized): Secondary | ICD-10-CM | POA: Diagnosis not present

## 2016-03-03 DIAGNOSIS — M25511 Pain in right shoulder: Secondary | ICD-10-CM

## 2016-03-03 DIAGNOSIS — R600 Localized edema: Secondary | ICD-10-CM | POA: Diagnosis not present

## 2016-03-03 NOTE — Patient Instructions (Signed)
Lying down passive ROM overhead--take deep breaths at the top   Deltoid isometrics :  Using hand to resist submax effort 30%  Strengthening: Isometric Flexion  Using wall for resistance, press right fist into ball using light pressure. Hold __5__ seconds. Repeat __5__ times per set. Do ___1_ sets per session. Do __3-4__ sessions per day.  SHOULDER: Abduction (Isometric)  Use wall as resistance. Press arm against pillow. Keep elbow straight. Hold __5_ seconds. __5 reps per set, _3-4__ sets per day, __7_ days per week    Also try putting the back of the hand to right hip/buttock hold and take deep breaths.       Ruben Im PT [facility] [address] [place], [place] [address] Phone # [phone] Fax [phone]

## 2016-03-03 NOTE — Therapy (Signed)
[facility] [address] [place], [place], [address] Phone: [phone]   Fax:  [phone]  Physical Therapy Treatment  Patient Details  Name: [name] MRN: [id number] Date of Birth: 1971-06-02 Referring Provider: [name]  Encounter Date: 03/03/2016      PT End of Session - 03/03/16 1154    Visit Number 3   Number of Visits 16   Date for PT Re-Evaluation 04/21/16   Authorization Type [facility] UMR   PT Start Time 1103   PT Stop Time 1145   PT Time Calculation (min) 42 min   Activity Tolerance Patient tolerated treatment well      Past Medical History  Diagnosis Date  . GERD (gastroesophageal reflux disease)   . Migraines     [facility]  . History of kidney problems     1975, eval by 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 Depression     followed by [name]    Past Surgical History  Procedure Laterality Date  . Cesarean section  2007 & 2009  . Tubal ligation    . Cholecystectomy  2009  . Uterine ablation    . Cystoscopy  age 44  . Shoulder arthroscopy with subacromial decompression, rotator cuff repair and bicep tendon repair Right 02/11/2016    Procedure: RIGHT SHOULDER ARTHROSCOPY WITH SUBACROMIAL DECOMPRESSION, OPEN DCR,  OPEN MINI ROTATOR CUFF REPAIR, LABRAL REPAIR;  Surgeon: Netta Cedars, MD;  Location: [name];  Service: Orthopedics;  Laterality: Right;    There were no vitals filed for this visit.      Subjective Assessment - 03/03/16 1105    Subjective (p) Fewer spasms, only 1x muscle relaxers now.    Currently in Pain? (p) No/denies   Pain Score (p) 0-No pain   Pain Location (p) Shoulder   Pain Orientation (p) Right                         [facility] Adult PT  Treatment/Exercise - 03/03/16 0001    Elbow Exercises   Elbow Flexion Strengthening;10 reps   Theraband Level (Elbow Flexion) Level 1 (Yellow)   Elbow Extension Strengthening;Right;10 reps   Theraband Level (Elbow Extension) Level 1 (Yellow)   Shoulder Exercises: Standing   Other Standing Exercises red ball roll on very low table 20x   Other Standing Exercises positional IR 3x with deep breath   Shoulder Exercises: Isometric Strengthening   Flexion 5X5"   Extension 5X5"   ABduction 5X5"   Manual Therapy   Manual Therapy Joint mobilization;Other (comment)   Manual therapy comments PROM elevation in scaption 20x; ER to 20 degrees   Joint Mobilization GH distraction and inferior grade 1/2   Soft tissue mobilization right upper trap and levator, rhomboids   Other Manual Therapy place and holds supine at 90degrees 6x 5 sec holds                PT Education - 03/03/16 1152    Education provided Yes   Education Details deltoid isometrics, biceps light strengthening;  positional IR in standing    Person(s) Educated Patient   Methods Explanation;Demonstration;Handout   Comprehension Verbalized understanding          PT Short Term Goals - 03/03/16  [place] #1   Title The patient will have passive  right shoulder elevation to 125 degrees needed for preparation for AROM  when appropriate with surgical healing 03/24/16   Time 4   Period Weeks   Status On-going   PT SHORT TERM GOAL #2   Title Active or active-assisted right shoulder elevation to 110 degrees needed for dressing and low level reaching   Time 4   Period Weeks   Status On-going   PT SHORT TERM GOAL #3   Title The patient will have ER to 45 degrees needed for driving   Time 4   Period Weeks   Status On-going   PT SHORT TERM GOAL #4   Title The patient will report decreased pain by 25% for improved sleep   Time 4   Period Weeks   Status On-going           PT Long Term Goals - 03/03/16  1205    PT LONG TERM GOAL #1   Title The patient will be independent in safe, self progression of HEP for further strengthening of right shoulder   04/21/16   Time 8   Period Weeks   Status On-going   PT LONG TERM GOAL #2   Title The patient will have 150 degrees of elevation overhead for reaching higher shelves at work and home   Time 8   Period Weeks   Status On-going   PT LONG TERM GOAL #3   Title The patient will have shoulder and scapular muscle strength to grossly 4/5 needed for lifting light to medium objects at home and work.     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   Decreasing glenohumeral swelling.  Verbal and tactile cues to decrease compensatory shoulder hike.  Therapist closely monitoring for pain and compensation.  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     PT Next Visit Plan PROM;  add scapular retract with yellow band, rhythmic stab short lever, positional IR, isometrics      Patient will benefit from skilled therapeutic intervention in order to improve the following deficits and impairments:     Visit Diagnosis: Pain in right shoulder  Stiffness of right shoulder, not elsewhere classified  Muscle  weakness (generalized)     Problem List There are no active problems to display for this patient.   [name] 03/03/2016, 12:06 PM  [facility] [address] [place], [place], [address] Phone: [phone]   Fax:  [phone]  Name: [name] MRN: [id number] Date of Birth: 01-16-1971    Ruben Im, PT 03/03/2016 12:07 PM Phone: [phone] Fax: [phone]

## 2016-03-07 ENCOUNTER — Ambulatory Visit: Payer: 59 | Admitting: Physical Therapy

## 2016-03-07 DIAGNOSIS — M25511 Pain in right shoulder: Secondary | ICD-10-CM | POA: Diagnosis not present

## 2016-03-07 DIAGNOSIS — M25611 Stiffness of right shoulder, not elsewhere classified: Secondary | ICD-10-CM | POA: Diagnosis not present

## 2016-03-07 DIAGNOSIS — R600 Localized edema: Secondary | ICD-10-CM | POA: Diagnosis not present

## 2016-03-07 DIAGNOSIS — M6281 Muscle weakness (generalized): Secondary | ICD-10-CM

## 2016-03-07 NOTE — Patient Instructions (Signed)
SHOULDER: External Rotation - Supine (Cane)   Hold cane with both hands. Rotate arm away from body. Keep elbow on floor and next to body. __10_ reps per set, 3_ sets per day, __7_ days per week Add towel to keep elbow at side.  Copyright  VHI. All rights reserved.  Cane flexion 10x 3x/day    Ruben Im PT [facility] [address], [place], [place] [address] Phone # [phone] Fax [phone]

## 2016-03-07 NOTE — Therapy (Signed)
[facility] [address] [place], [place], [address] Phone: [phone]   Fax:  [phone]  Physical Therapy Treatment  Patient Details  Name: [name] MRN: [id number] Date of Birth: [birth year] Referring Provider: [name]  Encounter Date: 03/07/2016      PT End of Session - 03/07/16 1750    Visit Number 4   Number of Visits 16   Date for PT Re-Evaluation 04/21/16   Authorization Type [facility] UMR   PT Start Time 1230   PT Stop Time 1315   PT Time Calculation (min) 45 min   Activity Tolerance Patient tolerated treatment well      Past Medical History  Diagnosis Date  . GERD (gastroesophageal reflux disease)   . Migraines     [facility]  . History of kidney problems     1975, eval by 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 Depression     followed by [name]    Past Surgical History  Procedure Laterality Date  . Cesarean section  2007 & 2009  . Tubal ligation    . Cholecystectomy  2009  . Uterine ablation    . Cystoscopy  age 35  . Shoulder arthroscopy with subacromial decompression, rotator cuff repair and bicep tendon repair Right 02/11/2016    Procedure: RIGHT SHOULDER ARTHROSCOPY WITH SUBACROMIAL DECOMPRESSION, OPEN DCR,  OPEN MINI ROTATOR CUFF REPAIR, LABRAL REPAIR;  Surgeon: Netta Cedars, MD;  Location: [place];  Service: Orthopedics;  Laterality: Right;    There were no vitals filed for this visit.      Subjective Assessment - 03/07/16 1233    Subjective I've been working on it-  passive flexion.  Sees MD next Thursday.     Currently in Pain? No/denies   Pain Score 0-No pain   Pain Orientation Right   Pain Type Surgical pain   Pain Frequency Intermittent                          [facility] Adult PT Treatment/Exercise - 03/07/16 0001    Shoulder Exercises: Supine   External Rotation AAROM;Right;10 reps  cane in 20 degrees abd   Flexion AAROM;Both;10 reps  cane   Shoulder Exercises: Seated   Other Seated Exercises ball roll on floor 15x   Shoulder Exercises: Standing   Other Standing Exercises scapular retraction 10x   Shoulder Exercises: Pulleys   Flexion 3 minutes   Shoulder Exercises: Therapy Ball   Other Therapy Ball Exercises blue ball roll on mat table 15x   Shoulder Exercises: Isometric Strengthening   Flexion 5X5"   Extension 5X5"   ABduction 5X5"   Manual Therapy   Manual therapy comments PROM elevation in scaption 20x; ER to 20 degrees   Joint Mobilization GH distraction and inferior grade 1/2   Other Manual Therapy place and holds supine at 90degrees 6x 5 sec holds                PT Education - 03/07/16 1749    Education provided Yes   Education Details supine cane flexion and ER   Person(s) Educated Patient   Methods Explanation;Demonstration   Comprehension Verbalized understanding;Returned demonstration          PT Short Term  Goals - 03/07/16 1752    PT SHORT TERM GOAL #1   Title The patient will have passive  right shoulder elevation to 125 degrees needed for preparation for AROM  when appropriate with surgical healing 03/24/16   Time 4   Period Weeks   Status On-going   PT SHORT TERM GOAL #2   Title Active or active-assisted right shoulder elevation to 110 degrees needed for dressing and low level reaching   Time 4   Period Weeks   Status On-going   PT SHORT TERM GOAL #3   Title The patient will have ER to 45 degrees needed for driving   Time 4   Period Weeks   Status On-going   PT SHORT TERM GOAL #4   Title The patient will report decreased pain by 25% for improved sleep   Time 4   Period Weeks   Status On-going           PT Long Term Goals - 03/07/16 1753    PT LONG  TERM GOAL #1   Title The patient will be independent in safe, self progression of HEP for further strengthening of right shoulder   04/21/16   Time 8   Period Weeks   Status On-going   PT LONG TERM GOAL #2   Title The patient will have 150 degrees of elevation overhead for reaching higher shelves at work and home   Time 8   Period Weeks   Status On-going   PT LONG TERM GOAL #3   Title The patient will have shoulder and scapular muscle strength to grossly 4/5 needed for lifting light to medium objects at home and work.     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  Progressing toward STGS.   Some shoulder stiffness > 90 degrees improves by end of treatment session.  Decreasing edema and muscle spasms. Therapist monitoring closely for adherence to surgical precautions and for pain.     PT Next Visit Plan PROM;  add scapular retract with yellow band, rhythmic stab short lever, positional IR, isometrics;  pulleys flexion;  add prone row      Patient will benefit from skilled therapeutic intervention in order to improve the following deficits and impairments:     Visit Diagnosis: Muscle weakness (generalized)  Pain in right shoulder  Stiffness of right shoulder, not elsewhere classified  Localized  edema     Problem List There are no active problems to display for this patient.   [name] 03/07/2016, 5:54 PM  [facility] [address], [place], [place], [address] Phone: [phone]   Fax:  [phone]  Name: [name] MRN: [id number] Date of Birth: 06-30-1971    Ruben Im, PT 03/07/2016 5:55 PM Phone: [phone] Fax: [phone]

## 2016-03-09 ENCOUNTER — Ambulatory Visit: Payer: 59 | Admitting: Physical Therapy

## 2016-03-09 DIAGNOSIS — M25511 Pain in right shoulder: Secondary | ICD-10-CM | POA: Diagnosis not present

## 2016-03-09 DIAGNOSIS — M6281 Muscle weakness (generalized): Secondary | ICD-10-CM | POA: Diagnosis not present

## 2016-03-09 DIAGNOSIS — R600 Localized edema: Secondary | ICD-10-CM | POA: Diagnosis not present

## 2016-03-09 DIAGNOSIS — M25611 Stiffness of right shoulder, not elsewhere classified: Secondary | ICD-10-CM | POA: Diagnosis not present

## 2016-03-09 NOTE — Therapy (Signed)
[facility] [address], [place], [place], [address] Phone: [phone]   Fax:  [phone]  Physical Therapy Treatment  Patient Details  Name: [name] MRN: [id number] Date of Birth: 1971-02-06 Referring Provider: [name]  Encounter Date: 03/09/2016      PT End of Session - 03/09/16 1252    Visit Number 5   Number of Visits 16   Date for PT Re-Evaluation 04/21/16   Authorization Type [facility] UMR   PT Start Time 1100   PT Stop Time 1145   PT Time Calculation (min) 45 min   Activity Tolerance Patient tolerated treatment well      Past Medical History  Diagnosis Date  . GERD (gastroesophageal reflux disease)   . Migraines     [facility]  . History of kidney problems     1975, eval by 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 Depression     followed by [name]    Past Surgical History  Procedure Laterality Date  . Cesarean section  2007 & 2009  . Tubal ligation    . Cholecystectomy  2009  . Uterine ablation    . Cystoscopy  age 45  . Shoulder arthroscopy with subacromial decompression, rotator cuff repair and bicep tendon repair Right 02/11/2016    Procedure: RIGHT SHOULDER ARTHROSCOPY WITH SUBACROMIAL DECOMPRESSION, OPEN DCR,  OPEN MINI ROTATOR CUFF REPAIR, LABRAL REPAIR;  Surgeon: Netta Cedars, MD;  Location: [facility];  Service: Orthopedics;  Laterality: Right;    There were no vitals filed for this visit.      Subjective Assessment - 03/09/16 1104    Subjective Intermittent tightness and spasm.  Didn't take any muscle relaxers yesterday and decreasing OTC medicine use.  I did 6 loads of laundry and made dinner last night.     Currently in Pain? No/denies   Pain Score 0-No pain   Pain Location  Shoulder   Pain Orientation Right                         [facility] Adult PT Treatment/Exercise - 03/09/16 0001    Exercises   Exercises Shoulder   Shoulder Exercises: Prone   Extension AROM;Right;10 reps   Shoulder Exercises: Standing   Protraction 5 reps  place and hold ball on wall 5x   Other Standing Exercises scapular retraction yellow band 10x   Other Standing Exercises ball roll on low table 15x; slide up railing 10x   Shoulder Exercises: Pulleys   Flexion 3 minutes   Shoulder Exercises: Isometric Strengthening   External Rotation 5X5"   Internal Rotation 5X5"   Manual Therapy   Manual therapy comments PROM elevation in scaption 20x; ER to 20 degrees   Joint Mobilization GH distraction and inferior grade 1/2   Other Manual Therapy place and holds supine at 90degrees 6x 5 sec holds                PT Education - 03/09/16 1252    Education provided Yes   Education Details IR and ER gentle isometrics; patient declines handout   Person(s) Educated Patient   Methods Explanation;Demonstration   Comprehension Verbalized understanding;Returned demonstration          PT Short Term Goals -  03/09/16 1258    PT SHORT TERM GOAL #1   Title The patient will have passive  right shoulder elevation to 125 degrees needed for preparation for AROM  when appropriate with surgical healing 03/24/16   Status Achieved   PT SHORT TERM GOAL #2   Title Active or active-assisted right shoulder elevation to 110 degrees needed for dressing and low level reaching   Time 4   Period Weeks   Status On-going   PT SHORT TERM GOAL #3   Title The patient will have ER to 45 degrees needed for driving   Time 4   Period Weeks   Status On-going   PT SHORT TERM GOAL #4   Title The patient will report decreased pain by 25% for improved sleep   Time 4   Period Weeks   Status On-going           PT Long Term Goals - 03/09/16 1300    PT LONG TERM GOAL #1   Title The patient  will be independent in safe, self progression of HEP for further strengthening of right shoulder   04/21/16   Time 8   Period Weeks   Status On-going   PT LONG TERM GOAL #2   Title The patient will have 150 degrees of elevation overhead for reaching higher shelves at work and home   Time 8   Period Weeks   Status On-going   PT LONG TERM GOAL #3   Title The patient will have shoulder and scapular muscle strength to grossly 4/5 needed for lifting light to medium objects at home and work.     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  She continues to progress with PROM and AAROM within surgical precautions.  Decreased stiffness noted with PROM.  Passive elevation 150 degrees.  Patient demonstrates good compliance with HEP.  Therapist closely monitoring response throughout session.     PT Next Visit Plan send MD note next visit;  PROM; AAROM right shoulder;  add prone row      Patient will benefit from skilled therapeutic intervention in order to improve the following deficits and impairments:     Visit Diagnosis: Muscle weakness (generalized)  Pain in right shoulder  Stiffness of right shoulder, not elsewhere classified  Localized edema     Problem List There are no active problems to display for this patient.   [name] 03/09/2016, 1:02 PM  [facility] [address], [place], [place], [address] Phone: [phone]   Fax:  [phone]  Name: [name]  MRN: [id number] Date of Birth: [birth year]    Ruben Im, PT 03/09/2016 1:03 PM Phone: [phone] Fax: [phone]

## 2016-03-14 ENCOUNTER — Ambulatory Visit: Payer: 59 | Admitting: Physical Therapy

## 2016-03-14 DIAGNOSIS — M6281 Muscle weakness (generalized): Secondary | ICD-10-CM

## 2016-03-14 DIAGNOSIS — M25511 Pain in right shoulder: Secondary | ICD-10-CM | POA: Diagnosis not present

## 2016-03-14 DIAGNOSIS — R600 Localized edema: Secondary | ICD-10-CM | POA: Diagnosis not present

## 2016-03-14 DIAGNOSIS — M25611 Stiffness of right shoulder, not elsewhere classified: Secondary | ICD-10-CM | POA: Diagnosis not present

## 2016-03-14 NOTE — Therapy (Signed)
[facility] [address] [place], [place], [address] Phone: [phone]   Fax:  [phone]  Physical Therapy Treatment  Patient Details  Name: [name] MRN: [id number] Date of Birth: [birth year] Referring Provider: [name]  Encounter Date: 03/14/2016      PT End of Session - 03/14/16 1143    Visit Number 6   Number of Visits 16   Date for PT Re-Evaluation 04/21/16   Authorization Type [facility] UMR   PT Start Time 1100   PT Stop Time 1145   PT Time Calculation (min) 45 min   Activity Tolerance Patient tolerated treatment well      Past Medical History  Diagnosis Date  . GERD (gastroesophageal reflux disease)   . Migraines     [facility]  . History of kidney problems     1975, eval by 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 Depression     followed by [name]    Past Surgical History  Procedure Laterality Date  . Cesarean section  2007 & 2009  . Tubal ligation    . Cholecystectomy  2009  . Uterine ablation    . Cystoscopy  age 44  . Shoulder arthroscopy with subacromial decompression, rotator cuff repair and bicep tendon repair Right 02/11/2016    Procedure: RIGHT SHOULDER ARTHROSCOPY WITH SUBACROMIAL DECOMPRESSION, OPEN DCR,  OPEN MINI ROTATOR CUFF REPAIR, LABRAL REPAIR;  Surgeon: Netta Cedars, MD;  Location: [facility];  Service: Orthopedics;  Laterality: Right;    There were no vitals filed for this visit.      Subjective Assessment - 03/14/16 1102    Subjective I'm doing more each day.  Tightness wraps around right shoulder.  MD on Thursday.  I've decided not to work this weekend.  Taking Ibuprofen but no more muscle relaxers or pain pills.     Currently in Pain? No/denies   Pain Score 0-No pain   Pain Location Shoulder   Pain Orientation Right   Pain Type Surgical pain   Pain Onset 1 to 4 weeks ago   Pain Frequency Intermittent            [facility] PT Assessment - 03/14/16 0001    AROM   AROM Assessment Site Shoulder   Right/Left Shoulder Right   Right Shoulder Flexion 145 Degrees  active assisted   Right Shoulder External Rotation 30 Degrees   PROM   Right Shoulder Flexion 150 Degrees   Right Shoulder External Rotation 40 Degrees                     [facility] Adult PT Treatment/Exercise - 03/14/16 0001    Shoulder Exercises: Supine   Protraction AAROM;Right;5 reps   Flexion AAROM;Both;10 reps  cane   Shoulder Exercises: Prone   Retraction AROM;Right;20 reps   Extension AROM;Right;20 reps   Shoulder Exercises: Standing   Protraction --  ball on wall 3x   Other Standing Exercises scapular retraction yellow band 10x   Other Standing Exercises pillow case slide on railing 12x   Shoulder Exercises: Pulleys   Flexion 3 minutes   Manual Therapy   Manual therapy comments PROM elevation in scaption 20x; ER to 20 degrees   Joint Mobilization GH distraction and inferior grade 1/2  Other Manual Therapy place and holds supine at 90degrees 6x 5 sec holds                  PT Short Term Goals - 03/14/16 1555    PT SHORT TERM GOAL #1   Title The patient will have passive  right shoulder elevation to 125 degrees needed for preparation for AROM  when appropriate with surgical healing 03/24/16   Status Achieved   PT SHORT TERM GOAL #2   Title Active or active-assisted right shoulder elevation to 110 degrees needed for dressing and low level reaching   Status Achieved   PT SHORT TERM GOAL #3   Title The patient will have ER to 45 degrees needed for driving   Time 4   Period Weeks   Status On-going   PT SHORT TERM GOAL #4   Title The patient will report decreased pain by 25% for improved sleep   Status Achieved           PT Long Term Goals -  03/14/16 1555    PT LONG TERM GOAL #1   Title The patient will be independent in safe, self progression of HEP for further strengthening of right shoulder   04/21/16   Time 8   Period Weeks   Status On-going   PT LONG TERM GOAL #2   Title The patient will have 150 degrees of elevation overhead for reaching higher shelves at work and home   Time 8   Period Weeks   Status On-going   PT LONG TERM GOAL #3   Title The patient will have shoulder and scapular muscle strength to grossly 4/5 needed for lifting light to medium objects at home and work.     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  Her passive shoulder elevation is 150 degrees, AAROM in supine 145 degrees.  Good scapular mobility.  She is highly compliant with AAROM at home and gentle isometrics.  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  Sees [name] Thursday.  Will continue to progress with AAROM, AROM and strengthening.     PT Next Visit Plan AAROM, AROM, low level strengthening      Patient will benefit from skilled therapeutic intervention in order to improve the following deficits and impairments:     Visit Diagnosis: Muscle  weakness (generalized)  Pain in right shoulder  Stiffness of right shoulder, not elsewhere classified  Problem List There are no active problems to display for this patient.   [name] 03/14/2016, 3:59 PM  [facility] [address] [place], [place], [address] Phone: [phone]   Fax:  [phone]  Name: [name] MRN: [id number] Date of Birth: 08/21/1971    Ruben Im, PT 03/14/2016 3:59 PM Phone: [phone] Fax: [phone]

## 2016-03-15 ENCOUNTER — Encounter: Payer: 59 | Attending: Obstetrics and Gynecology | Admitting: Dietician

## 2016-03-15 ENCOUNTER — Encounter: Payer: Self-pay | Admitting: Dietician

## 2016-03-15 VITALS — Wt 190.3 lb

## 2016-03-15 DIAGNOSIS — Z713 Dietary counseling and surveillance: Secondary | ICD-10-CM | POA: Diagnosis not present

## 2016-03-15 DIAGNOSIS — E669 Obesity, unspecified: Secondary | ICD-10-CM

## 2016-03-15 NOTE — Progress Notes (Signed)
  Medical Nutrition Therapy:  Appt start time: 1030 end time: 1055  Assessment:  Primary concerns today: [name] is here today for help with weight loss with a 8 lb weight loss. Had rotator cuff surgery recently so she hasn't been able to do her boot camp, though has maintained weight loss. [facility] [employer] and hoping she'll be able to ramp up activity soon. Will need to wait 3 months to go back to boot camp.   Still using MyFitnessPal. Trying to do 45% carbohydrates, 25% fat, and 30% protein in MyFitnessPal. Feels like she does better when she logs her food. Hasn't been as consistent lately with her surgery. Eating out more and and having more sweets but trying to keep it in check. Gets back on track after indulging on weekends.   Husband is still following Eat to Live (mostly vegetarian) diet.   TANITA  BODY COMP RESULTS  03/15/16   BMI (kg/m^2) 32.7   Fat Mass (lbs) 83.0   Fat Free Mass (lbs) 107.5   Total Body Water (lbs) 78.5    Preferred Learning Style:   No preference indicated   Learning Readiness:   Ready   MEDICATIONS: see list   DIETARY INTAKE:  Avoided foods include brussels spouts, red meat at home, white rice, walnuts, pecans  24-hr recall:  B ( AM): Protein shake Control and instrumentation engineer) with almond milk and banana and workout days, on weekend oatmeal with fruit and coffee with low fat creamer and artificial sweetener Snk ( 1030 AM):  L ( 12:30 PM): boiled eggs and with [place] sausage or bacon or chicken salad with greek yogurt with whole wheat toast Snk (1 PM): none D ( PM): vegetables/vegetarian dishes such as spaghetti squash with sauce or soup with crackers or bread or fish with vegetables and sweet potatoes or veggie chili or eating out  Snk ( PM): none or fruit/popcorn  Beverages: coffee with 1/2 pack of stevia and low fat creamer, water, unsweet tea  Usual physical [facility] [employer]  Estimated energy needs: 1800 calories 200 g  carbohydrates 135 g protein 50 g fat  Progress Towards Goal(s):  In progress.   Nutritional Diagnosis:  Iron Post-3.3 Overweight/obesity As related to hx of excess snacking, larger portions, and inadequate physical activity.  As evidenced by BMI of 36.6.    Intervention:  Nutrition counseling provided.   Plan: Continue being consistent with healthy diet choices and meal pattern during the week. Continue to limit night time snacking (eating after 7 PM).  Continue exercising as much as you can.  Get back to tracking on MyFitnessPal.  Teaching Method Utilized:  Visual Auditory  Barriers to learning/adherence to lifestyle change: none  Demonstrated degree of understanding via:  Teach Back   Monitoring/Evaluation:  Dietary intake, exercise, mindful eating, and body weight in 4 month(s).

## 2016-03-15 NOTE — Patient Instructions (Addendum)
Continue being consistent with healthy diet choices and meal pattern during the week. Continue to limit night time snacking (eating after 7 PM).  Continue exercising as much as you can.  Get back to tracking on MyFitnessPal.

## 2016-03-17 ENCOUNTER — Ambulatory Visit: Payer: 59 | Admitting: Physical Therapy

## 2016-03-17 DIAGNOSIS — M25611 Stiffness of right shoulder, not elsewhere classified: Secondary | ICD-10-CM

## 2016-03-17 DIAGNOSIS — M25511 Pain in right shoulder: Secondary | ICD-10-CM | POA: Diagnosis not present

## 2016-03-17 DIAGNOSIS — M6281 Muscle weakness (generalized): Secondary | ICD-10-CM

## 2016-03-17 DIAGNOSIS — R600 Localized edema: Secondary | ICD-10-CM | POA: Diagnosis not present

## 2016-03-17 NOTE — Patient Instructions (Signed)
Strengthening: Resisted Internal Rotation   Hold tubing in left hand, elbow at side and forearm out. Rotate forearm in across body. Repeat __10__ times per set. Do __1__ sets per session. Do _1___ sessions per day.  http://orth.exer.us/830   Copyright  VHI. All rights reserved.  Strengthening: Resisted External Rotation   Hold tubing in right hand, elbow at side and forearm across body. Rotate forearm out. Repeat ___10_ times per set. Do ___1_ sets per session. Do __1__ sessions per day.  http://orth.exer.us/828   Copyright  VHI. All rights reserved.  Strengthening: Resisted Flexion   Hold tubing with left arm at side. Pull forward and up. Move shoulder through pain-free range of motion. Repeat __10__ times per set. Do _1___ sets per session. Do __1__ sessions per day.  http://orth.exer.us/824   Copyright  VHI. All rights reserved.  Strengthening: Resisted Extension   Hold tubing in right hand, arm forward. Pull arm back, elbow straight. Repeat __10__ times per set. Do __1__ sets per session. Do __1__ sessions per day.  http://orth.exer.us/832   Copyright  VHI. All rights reserved.     Ruben Im PT [facility] [address], [name], [place] [address] Phone # [phone] Fax [phone]

## 2016-03-17 NOTE — Therapy (Signed)
[facility] [address], [place], [place], [address] Phone: [phone]   Fax:  [phone]  Physical Therapy Treatment  Patient Details  Name: [name] MRN: [id number] Date of Birth: May 14, 1971 Referring Provider: [name]  Encounter Date: 03/17/2016      PT End of Session - 03/17/16 1140    Visit Number 7   Number of Visits 16   Date for PT Re-Evaluation 04/21/16   Authorization Type [facility] UMR   PT Start Time 1100   PT Stop Time 1145   PT Time Calculation (min) 45 min   Activity Tolerance Patient tolerated treatment well      Past Medical History  Diagnosis Date  . GERD (gastroesophageal reflux disease)   . Migraines     [facility]  . History of kidney problems     1975, eval by 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 Depression     followed by [name]    Past Surgical History  Procedure Laterality Date  . Cesarean section  2007 & 2009  . Tubal ligation    . Cholecystectomy  2009  . Uterine ablation    . Cystoscopy  age 25  . Shoulder arthroscopy with subacromial decompression, rotator cuff repair and bicep tendon repair Right 02/11/2016    Procedure: RIGHT SHOULDER ARTHROSCOPY WITH SUBACROMIAL DECOMPRESSION, OPEN DCR,  OPEN MINI ROTATOR CUFF REPAIR, LABRAL REPAIR;  Surgeon: Netta Cedars, MD;  Location: [facility];  Service: Orthopedics;  Laterality: Right;    There were no vitals filed for this visit.      Subjective Assessment - 03/17/16 1101    Subjective Got a good report from the doctor yesterday.  Going back to work next weekend.  States she got a new PT order but left it at home.     Currently in Pain? No/denies   Pain Score 0-No pain   Pain Location Shoulder   Pain Orientation Right    Pain Type Surgical pain                         [facility] Adult PT Treatment/Exercise - 03/17/16 0001    Shoulder Exercises: Supine   Flexion AROM;Right;10 reps   Shoulder Exercises: Prone   Retraction AROM;Right;20 reps   Extension AROM;Right;20 reps   Shoulder Exercises: Standing   External Rotation Strengthening;Right;15 reps;Theraband   Theraband Level (Shoulder External Rotation) Level 1 (Yellow)   Internal Rotation Strengthening;Right;15 reps;Theraband   Theraband Level (Shoulder Internal Rotation) Level 1 (Yellow)   Flexion Strengthening;Right;Theraband   Theraband Level (Shoulder Flexion) Level 1 (Yellow)   Extension Strengthening;Right;15 reps;Theraband   Theraband Level (Shoulder Extension) Level 1 (Yellow)   Row Strengthening;Both;15 reps;Theraband   Theraband Level (Shoulder Row) Level 1 (Yellow)   Other Standing Exercises red ball roll on table incline 15x   Other Standing Exercises pillow case slide on railing 12x   Shoulder Exercises: Pulleys   Flexion 3 minutes   Manual Therapy   Manual therapy comments PROM elevation in scaption 20x; ER to 20 degrees   Joint Mobilization GH distraction and inferior grade 1/2                PT Education - 03/17/16 1134    Education provided  Yes   Education Details yellow band modified rockwoods   Person(s) Educated Patient   Methods Explanation;Demonstration;Handout   Comprehension Verbalized understanding;Returned demonstration          PT Short Term Goals - 03/17/16 1157    PT SHORT TERM GOAL #1   Title The patient will have passive  right shoulder elevation to 125 degrees needed for preparation for AROM  when appropriate with surgical healing 03/24/16   Status Achieved   PT SHORT TERM GOAL #2   Title Active or active-assisted right shoulder elevation to 110 degrees needed for dressing and low level reaching   Status Achieved   PT SHORT TERM GOAL #3   Title The patient will have ER to 45 degrees  needed for driving   Time 4   Period Weeks   Status On-going   PT SHORT TERM GOAL #4   Title The patient will report decreased pain by 25% for improved sleep   Status Achieved           PT Long Term Goals - 03/17/16 1157    PT LONG TERM GOAL #1   Title The patient will be independent in safe, self progression of HEP for further strengthening of right shoulder   04/21/16   Time 8   Period Weeks   Status On-going   PT LONG TERM GOAL #2   Title The patient will have 150 degrees of elevation overhead for reaching higher shelves at work and home   Time 8   Period Weeks   Status On-going   PT LONG TERM GOAL #3   Title The patient will have shoulder and scapular muscle strength to grossly 4/5 needed for lifting light to medium objects at home and work.     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  MD appt went well per patient report.  AROM and very light strengthening initiated with short arc movements only.  MInimal discomfort reported.  Therapist  closely monitoring response throughout treatment session.     PT Next Visit Plan AAROM, AROM, low level strengthening progression      Patient will benefit from skilled therapeutic intervention in order to improve the following deficits and impairments:      Visit Diagnosis: Muscle weakness (generalized)  Pain in right shoulder  Stiffness of right shoulder, not elsewhere classified     Problem List There are no active problems to display for this patient.   [name] 03/17/2016, 11:58 AM  [facility] [address]  [place], [place], [address] Phone: [phone]   Fax:  [phone]  Name: [name] MRN: [id number] Date of Birth: 1971-11-20    Ruben Im, PT 03/17/2016 11:59 AM Phone: [phone] Fax: [phone]

## 2016-03-21 ENCOUNTER — Ambulatory Visit: Payer: 59 | Admitting: Physical Therapy

## 2016-03-21 DIAGNOSIS — M25611 Stiffness of right shoulder, not elsewhere classified: Secondary | ICD-10-CM | POA: Diagnosis not present

## 2016-03-21 DIAGNOSIS — M25511 Pain in right shoulder: Secondary | ICD-10-CM

## 2016-03-21 DIAGNOSIS — M6281 Muscle weakness (generalized): Secondary | ICD-10-CM | POA: Diagnosis not present

## 2016-03-21 DIAGNOSIS — R600 Localized edema: Secondary | ICD-10-CM | POA: Diagnosis not present

## 2016-03-21 NOTE — Therapy (Signed)
[facility] [address], [place], [place], [address] Phone: [phone]   Fax:  [phone]  Physical Therapy Treatment  Patient Details  Name: [name] MRN: [id number] Date of Birth: [birth year] Referring Provider: [name]  Encounter Date: 03/21/2016      PT End of Session - 03/21/16 1200    Visit Number 8   Number of Visits 16   Date for PT Re-Evaluation 04/21/16   Authorization Type [facility] UMR   PT Start Time 1100   PT Stop Time 1145   PT Time Calculation (min) 45 min   Activity Tolerance Patient tolerated treatment well      Past Medical History  Diagnosis Date  . GERD (gastroesophageal reflux disease)   . Migraines     [facility]  . History of kidney problems     1975, eval by 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 Depression     followed by [name]    Past Surgical History  Procedure Laterality Date  . Cesarean section  2007 & 2009  . Tubal ligation    . Cholecystectomy  2009  . Uterine ablation    . Cystoscopy  age 89  . Shoulder arthroscopy with subacromial decompression, rotator cuff repair and bicep tendon repair Right 02/11/2016    Procedure: RIGHT SHOULDER ARTHROSCOPY WITH SUBACROMIAL DECOMPRESSION, OPEN DCR,  OPEN MINI ROTATOR CUFF REPAIR, LABRAL REPAIR;  Surgeon: Netta Cedars, MD;  Location: [place];  Service: Orthopedics;  Laterality: Right;    There were no vitals filed for this visit.      Subjective Assessment - 03/21/16 1106    Subjective Patient brought in her MD order:  AAROM to full and progress as tolerated, PREs;  OK for IR behind back.  Been more sore since activity increasing.    Currently in Pain? Yes   Pain Score 4    Pain Location Shoulder   Pain Orientation Right    Pain Type Surgical pain   Pain Onset More than a month ago   Pain Frequency Intermittent                         [facility] Adult PT Treatment/Exercise - 03/21/16 0001    Shoulder Exercises: Prone   Retraction AROM;Right;20 reps   Extension AROM;Right;20 reps   Horizontal ABduction 1 10 reps;AROM;Right  bent elbow   Shoulder Exercises: Standing   External Rotation Strengthening;Right;15 reps;Theraband   Theraband Level (Shoulder External Rotation) Level 1 (Yellow)   Internal Rotation Strengthening;Right;15 reps;Theraband   Theraband Level (Shoulder Internal Rotation) Level 1 (Yellow)   Flexion Strengthening;Right;Theraband   Theraband Level (Shoulder Flexion) Level 1 (Yellow)   Extension Strengthening;Right;15 reps;Theraband   Theraband Level (Shoulder Extension) Level 1 (Yellow)   Row Strengthening;Both;15 reps;Theraband   Theraband Level (Shoulder Row) Level 1 (Yellow)   Other Standing Exercises red ball on table incline and up wall 10x each   Other Standing Exercises pillow case slide on railing 12x   Shoulder Exercises: Therapy Ball   Flexion 10 reps  on railing   Manual Therapy   Manual therapy comments PROM elevation in scaption 20x; ER to 20 degrees   Joint Mobilization GH distraction and inferior grade 1/2   Other Manual Therapy  place and holds supine at 90degrees 6x 5 sec holds  rhythmic stab at 60, 90 120 degrees elevation                PT Education - 03/21/16 1201    Education provided Yes   Education Details sidelying ER, prone Horizontal abd with bent elbow   Person(s) Educated Patient   Methods Explanation;Demonstration   Comprehension Verbalized understanding;Returned demonstration          PT Short Term Goals - 03/21/16 1209    PT SHORT TERM GOAL #1   Title The patient will have passive  right shoulder elevation to 125 degrees needed for preparation for AROM  when appropriate with surgical healing 03/24/16   Status Achieved   PT  SHORT TERM GOAL #2   Title Active or active-assisted right shoulder elevation to 110 degrees needed for dressing and low level reaching   Status Achieved   PT SHORT TERM GOAL #3   Title The patient will have ER to 45 degrees needed for driving   Period Weeks   Status Achieved   PT SHORT TERM GOAL #4   Title The patient will report decreased pain by 25% for improved sleep   Status Achieved           PT Long Term Goals - 03/21/16 1209    PT LONG TERM GOAL #1   Title The patient will be independent in safe, self progression of HEP for further strengthening of right shoulder   04/21/16   Time 8   Period Weeks   Status On-going   PT LONG TERM GOAL #2   Title The patient will have 150 degrees of elevation overhead for reaching higher shelves at work and home   Time 8   Period Weeks   Status On-going   PT LONG TERM GOAL #3   Title The patient will have shoulder and scapular muscle strength to grossly 4/5 needed for lifting light to medium objects at home and work.     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  All STGs met.    She lacks motor control to lift her UE against gravity without substitution patterns.  In supine she is able to lift and lower her arm 3-4 times before complete muscular fatigue.  Therapist  closely monitoring response with all treatment interventions.  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     PT Next Visit Plan AAROM, AROM, low level strengthening progression      Patient will benefit from skilled therapeutic intervention in order  to improve the following deficits and impairments:     Visit Diagnosis: Muscle weakness (generalized)  Pain in right shoulder  Stiffness of right shoulder, not elsewhere classified     Problem List There are no active problems to display for this patient.  Ruben Im, PT 03/21/2016 12:11 PM Phone: [phone] Fax: [phone]  [name] 03/21/2016, 12:11 PM  [facility] [address], [place], [place], [address] Phone: [phone]   Fax:  [phone]  Name: [name] MRN: [id number] Date of Birth: 05-Oct-1971

## 2016-03-24 ENCOUNTER — Ambulatory Visit: Payer: 59 | Admitting: Physical Therapy

## 2016-03-24 DIAGNOSIS — M25511 Pain in right shoulder: Secondary | ICD-10-CM | POA: Diagnosis not present

## 2016-03-24 DIAGNOSIS — M6281 Muscle weakness (generalized): Secondary | ICD-10-CM | POA: Diagnosis not present

## 2016-03-24 DIAGNOSIS — M25611 Stiffness of right shoulder, not elsewhere classified: Secondary | ICD-10-CM

## 2016-03-24 DIAGNOSIS — R600 Localized edema: Secondary | ICD-10-CM | POA: Diagnosis not present

## 2016-03-24 NOTE — Therapy (Signed)
[facility] [address] [place], [place], [address] Phone: [phone]   Fax:  [phone]  Physical Therapy Treatment  Patient Details  Name: [name] MRN: [id number] Date of Birth: 1971/05/04 Referring Provider: [name]  Encounter Date: 03/24/2016      PT End of Session - 03/24/16 1151    Visit Number 9   Number of Visits 16   Date for PT Re-Evaluation 04/21/16   Authorization Type [facility] UMR   PT Start Time 1100   PT Stop Time 1149   PT Time Calculation (min) 49 min   Activity Tolerance Patient tolerated treatment well      Past Medical History  Diagnosis Date  . GERD (gastroesophageal reflux disease)   . Migraines     [facility]  . History of kidney problems     1975, eval by 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 Depression     followed by [name]    Past Surgical History  Procedure Laterality Date  . Cesarean section  2007 & 2009  . Tubal ligation    . Cholecystectomy  2009  . Uterine ablation    . Cystoscopy  age 43  . Shoulder arthroscopy with subacromial decompression, rotator cuff repair and bicep tendon repair Right 02/11/2016    Procedure: RIGHT SHOULDER ARTHROSCOPY WITH SUBACROMIAL DECOMPRESSION, OPEN DCR,  OPEN MINI ROTATOR CUFF REPAIR, LABRAL REPAIR;  Surgeon: Netta Cedars, MD;  Location: [place];  Service: Orthopedics;  Laterality: Right;    There were no vitals filed for this visit.      Subjective Assessment - 03/24/16 1101    Subjective I was busy yesterday using arm a lot, took ibuprofen;  Goes back to work tomorrow and Sunday.     Currently in Pain? Yes   Pain Score 2    Pain Location Shoulder   Pain Orientation Right                         [facility]  Adult PT Treatment/Exercise - 03/24/16 0001    Shoulder Exercises: Prone   Retraction Strengthening;Right;15 reps;Weights   Retraction Weight (lbs) 1   Extension Strengthening;Right;15 reps;Weights   Extension Weight (lbs) 1   Horizontal ABduction 1 Strengthening;Right;15 reps;Weights   Horizontal ABduction 1 Weight (lbs) 1   Shoulder Exercises: Sidelying   External Rotation Strengthening;Right;15 reps;Weights   External Rotation Weight (lbs) 1   Shoulder Exercises: Standing   Protraction --  1# on wrist, ball roll on table 12x   Other Standing Exercises red ball on table incline and up wall 10x each   Other Standing Exercises pillow case slide on railing 12x with 1# weight on wrist   Shoulder Exercises: ROM/Strengthening   UBE (Upper Arm Bike) 5 min Forward only   Manual Therapy   Manual therapy comments PROM elevation in scaption; ER to 45 degrees   Joint Mobilization GH distraction and inferior grade 1/2                PT Education - 03/24/16 1150    Education provided Yes   Education Details Discussed precautions for this stage 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) Educated Patient   Methods Explanation   Comprehension  Verbalized understanding          PT Short Term Goals - 03/24/16 1154    PT SHORT TERM GOAL #1   Title The patient will have passive  right shoulder elevation to 125 degrees needed for preparation for AROM  when appropriate with surgical healing 03/24/16   Status Achieved   PT SHORT TERM GOAL #2   Title Active or active-assisted right shoulder elevation to 110 degrees needed for dressing and low level reaching   Status Achieved   PT SHORT TERM GOAL #3   Title The patient will have ER to 45 degrees needed for driving   Status Achieved   PT SHORT TERM GOAL #4   Title The patient will report decreased pain by 25% for improved sleep   Status Achieved           PT Long Term Goals - 03/24/16 1155    PT LONG TERM GOAL #1   Title  The patient will be independent in safe, self progression of HEP for further strengthening of right shoulder   04/21/16   Time 8   Period Weeks   Status On-going   PT LONG TERM GOAL #2   Title The patient will have 150 degrees of elevation overhead for reaching higher shelves at work and home   Time 8   Period Weeks   Status On-going   PT LONG TERM GOAL #3   Title The patient will have shoulder and scapular muscle strength to grossly 4/5 needed for lifting light to medium objects at home and work.     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  Initiated very light strengthening and AROM progression.  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  Discussed current precautions and how to apply with work situations.     PT Next Visit Plan AAROM, AROM, low level strengthening progression;  AROM in supine incline;  Measure AROM;  Do FOTO      Patient will benefit from skilled therapeutic intervention in order to improve the following deficits and impairments:     Visit Diagnosis: Muscle weakness (generalized)  Pain in right shoulder  Stiffness of right shoulder, not elsewhere classified     Problem List There are no active problems to display for this patient.    Ruben Im, PT 03/24/2016 11:57 AM Phone: [phone] Fax: [phone]   [name] 03/24/2016, 11:56 AM  [facility] [address] [place], [place], [address] Phone: [phone]  Fax:  [phone]  Name: [name] MRN: [id number] Date of Birth: [birth year]

## 2016-03-28 ENCOUNTER — Ambulatory Visit: Payer: 59 | Attending: Orthopedic Surgery | Admitting: Physical Therapy

## 2016-03-28 DIAGNOSIS — M25511 Pain in right shoulder: Secondary | ICD-10-CM | POA: Diagnosis not present

## 2016-03-28 DIAGNOSIS — M25522 Pain in left elbow: Secondary | ICD-10-CM | POA: Insufficient documentation

## 2016-03-28 DIAGNOSIS — M6281 Muscle weakness (generalized): Secondary | ICD-10-CM | POA: Diagnosis not present

## 2016-03-28 DIAGNOSIS — M25611 Stiffness of right shoulder, not elsewhere classified: Secondary | ICD-10-CM | POA: Diagnosis not present

## 2016-03-28 NOTE — Therapy (Signed)
[facility] [address], [name], [place], [address] Phone: [phone]   Fax:  [phone]  Physical Therapy Treatment  Patient Details  Name: [name] MRN: [id number] Date of Birth: 03-23-1971 Referring Provider: [name]  Encounter Date: 03/28/2016      PT End of Session - 03/28/16 2002    Visit Number 10   Number of Visits 16   Date for PT Re-Evaluation 04/21/16   Authorization Type [facility] UMR   PT Start Time 1100   PT Stop Time 1140   PT Time Calculation (min) 40 min   Activity Tolerance Patient tolerated treatment well      Past Medical History  Diagnosis Date  . GERD (gastroesophageal reflux disease)   . Migraines     [facility]  . History of kidney problems     1975, eval by 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 Depression     followed by [name]    Past Surgical History  Procedure Laterality Date  . Cesarean section  2007 & 2009  . Tubal ligation    . Cholecystectomy  2009  . Uterine ablation    . Cystoscopy  age 8  . Shoulder arthroscopy with subacromial decompression, rotator cuff repair and bicep tendon repair Right 02/11/2016    Procedure: RIGHT SHOULDER ARTHROSCOPY WITH SUBACROMIAL DECOMPRESSION, OPEN DCR,  OPEN MINI ROTATOR CUFF REPAIR, LABRAL REPAIR;  Surgeon: Netta Cedars, MD;  Location: [place];  Service: Orthopedics;  Laterality: Right;    There were no vitals filed for this visit.      Subjective Assessment - 03/28/16 1056    Subjective Went back to work Saturday and it was busy, I was sore.  Sunday was slower and I was able to rest it.  I've got this weekend off.  I did forget and try to lift the O2 tank while at work.    Currently in Pain? Yes   Pain Score 0-No pain   Pain Location Shoulder   Pain Orientation Right   Pain Type Surgical pain   Pain Onset More than a month ago   Pain Frequency Intermittent            [facility] PT Assessment - 03/28/16 0001    Observation/Other Assessments   Focus on Therapeutic Outcomes (FOTO)  50%                     [facility] Adult PT Treatment/Exercise - 03/28/16 0001    Shoulder Exercises: Supine   Protraction Strengthening;Right;15 reps;Weights   Protraction Weight (lbs) 1   Flexion AROM;Right;10 reps   Other Supine Exercises incline 30 degrees 5x   Shoulder Exercises: Prone   Retraction Strengthening;Right;20 reps;Weights   Retraction Weight (lbs) 1   Extension Strengthening;Right;20 reps;Weights   Extension Weight (lbs) 1   Horizontal ABduction 1 Strengthening;Right;20 reps   Horizontal ABduction 1 Weight (lbs) 0   Shoulder Exercises: Sidelying   External Rotation Strengthening;Right;20 reps;Weights   External Rotation Weight (lbs) 1   Shoulder Exercises: Standing   Protraction --  wall push ups 15x   Other Standing Exercises 3# biceps 25x   Other Standing Exercises red band triceps 25x   Shoulder Exercises: Therapy Ball   Other Therapy Ball Exercises red ball  on wall Circles both directions 10x each   Other Therapy Ball Exercises red ball up wall 12x   Shoulder Exercises: ROM/Strengthening   UBE (Upper Arm Bike) 5 min Forward only   Other ROM/Strengthening Exercises doorway pillow case slides with opposite hip extension 15x   Other ROM/Strengthening Exercises seated place/holds 6x 5 sec holds 60,90.100 degrees   Manual Therapy   Manual therapy comments PROM elevation in scaption; ER to 45 degrees   Joint Mobilization GH distraction and inferior grade 1/2                  PT Short Term Goals - 03/28/16 2006    PT SHORT TERM GOAL #1   Title The patient will have passive  right shoulder elevation to 125 degrees needed for preparation for AROM  when appropriate with surgical  healing 03/24/16   Status Achieved   PT SHORT TERM GOAL #2   Title Active or active-assisted right shoulder elevation to 110 degrees needed for dressing and low level reaching   Status Achieved   PT SHORT TERM GOAL #3   Title The patient will have ER to 45 degrees needed for driving   Status Achieved   PT SHORT TERM GOAL #4   Title The patient will report decreased pain by 25% for improved sleep   Status Achieved           PT Long Term Goals - 03/28/16 2006    PT LONG TERM GOAL #1   Title The patient will be independent in safe, self progression of HEP for further strengthening of right shoulder   04/21/16   Time 8   Period Weeks   Status On-going   PT LONG TERM GOAL #2   Title The patient will have 150 degrees of elevation overhead for reaching higher shelves at work and home   Time 8   Period Weeks   Status On-going   PT LONG TERM GOAL #3   Title The patient will have shoulder and scapular muscle strength to grossly 4/5 needed for lifting light to medium objects at home and work.     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  FOTO functional outcome score improved from 66% to 50% limitation.  Minimal verbal and tactile cues to avoid compensatory shoulder shrug and to facilitate scapular mobilty.  Lacks motor control for elevation against  gravity without these compensations.  She will need further rehab to progress ther ex as appropriate wiithin her rotator cuff surgical precautions.     PT Next Visit Plan AAROM, AROM, low level strengthening progression to  red band;  AROM in supine incline; supine stabilization with light weight      Patient will benefit from skilled therapeutic intervention in order to improve the following deficits and impairments:     Visit Diagnosis: Muscle weakness (generalized)  Pain in right shoulder  Stiffness of right shoulder, not elsewhere classified     Problem List There are no active problems to display for this patient. Ruben Im, PT 03/28/2016 8:08 PM Phone: [phone] Fax: [phone] [name] 03/28/2016, 8:08 PM  [facility] [address], [place], [place], [address] Phone: [phone]   Fax:  [phone]  Name: [name] MRN: [id number] Date of Birth: [birth year]

## 2016-03-30 ENCOUNTER — Ambulatory Visit: Payer: 59 | Admitting: Physical Therapy

## 2016-03-30 DIAGNOSIS — M25511 Pain in right shoulder: Secondary | ICD-10-CM

## 2016-03-30 DIAGNOSIS — M25522 Pain in left elbow: Secondary | ICD-10-CM | POA: Diagnosis not present

## 2016-03-30 DIAGNOSIS — M6281 Muscle weakness (generalized): Secondary | ICD-10-CM

## 2016-03-30 DIAGNOSIS — M25611 Stiffness of right shoulder, not elsewhere classified: Secondary | ICD-10-CM | POA: Diagnosis not present

## 2016-03-30 NOTE — Therapy (Signed)
[facility] [address], [place], [place], [address] Phone: [phone]   Fax:  [phone]  Physical Therapy Treatment  Patient Details  Name: [name] MRN: [id number] Date of Birth: 1971-09-01 Referring Provider: [name]  Encounter Date: 03/30/2016      PT End of Session - 03/30/16 1147    Visit Number 11   Number of Visits 16   Date for PT Re-Evaluation 04/21/16   Authorization Type [facility] U[facility]   PT Start Time 1100   PT Stop Time 1145   PT Time Calculation (min) 45 min   Activity Tolerance Patient tolerated treatment well      Past Medical History  Diagnosis Date  . GERD (gastroesophageal reflux disease)   . Migraines     [facility]  . History of kidney problems     1975, eval by 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 Depression     followed by [name]    Past Surgical History  Procedure Laterality Date  . Cesarean section  2007 & 2009  . Tubal ligation    . Cholecystectomy  2009  . Uterine ablation    . Cystoscopy  age 65  . Shoulder arthroscopy with subacromial decompression, rotator cuff repair and bicep tendon repair Right 02/11/2016    Procedure: RIGHT SHOULDER ARTHROSCOPY WITH SUBACROMIAL DECOMPRESSION, OPEN DCR,  OPEN MINI ROTATOR CUFF REPAIR, LABRAL REPAIR;  Surgeon: Netta Cedars, MD;  Location: [facility];  Service: Orthopedics;  Laterality: Right;    There were no vitals filed for this visit.      Subjective Assessment - 03/30/16 1101    Subjective No new complaints.  Wearing left elbow brace.     Currently in Pain? No/denies   Pain Score 0-No pain   Pain Location Shoulder   Pain Orientation Right   Pain Type Surgical pain   Pain Frequency Intermittent                          [facility] Adult PT Treatment/Exercise - 03/30/16 0001    Shoulder Exercises: Supine   Protraction Strengthening;Right;15 reps;Weights   Protraction Weight (lbs) 2   Flexion Strengthening;Right;10 reps   Shoulder Flexion Weight (lbs) 1   Other Supine Exercises incline 30 degrees 5x   Other Supine Exercises 1# 12:00/6 and 9:00/3 10x each   Shoulder Exercises: Seated   Elevation Strengthening;Right;Weights   Shoulder Exercises: Prone   Retraction Strengthening;Right;20 reps;Weights   Retraction Weight (lbs) 1   Extension Strengthening;Right;20 reps;Weights   Extension Weight (lbs) 1   Horizontal ABduction 1 Strengthening;Right;20 reps   Horizontal ABduction 1 Weight (lbs) 1   Shoulder Exercises: Sidelying   External Rotation Strengthening;Right;20 reps;Weights   External Rotation Weight (lbs) 1   Shoulder Exercises: Standing   External Rotation Strengthening;Right;15 reps;Theraband   Theraband Level (Shoulder External Rotation) Level 2 (Red)   Internal Rotation Strengthening;Right;15 reps;Theraband   Theraband Level (Shoulder Internal Rotation) Level 2 (Red)   Flexion Strengthening;Right;Theraband   Theraband Level (Shoulder Flexion) Level 2 (Red)   Extension Strengthening;Right;15 reps;Theraband   Theraband Level (Shoulder Extension) Level 2 (Red)   Row Strengthening;Right;Theraband   Theraband Level (Shoulder Row) Level 2 (Red)   Other Standing Exercises ladder walk 10x  Shoulder Exercises: ROM/Strengthening   UBE (Upper Arm Bike) 7 min Forward only   Other ROM/Strengthening Exercises seated place/holds 6x 5 sec holds 60,90.100 degrees   Manual Therapy   Manual therapy comments PROM elevation in scaption; ER to 45 degrees   Joint Mobilization GH distraction and inferior grade 1/2                  PT Short Term Goals - 03/30/16 1312    PT SHORT TERM GOAL #1   Title The patient will have passive  right shoulder elevation to 125  degrees needed for preparation for AROM  when appropriate with surgical healing 03/24/16   Status Achieved   PT SHORT TERM GOAL #2   Title Active or active-assisted right shoulder elevation to 110 degrees needed for dressing and low level reaching   Status Achieved   PT SHORT TERM GOAL #3   Status Achieved   PT SHORT TERM GOAL #4   Title The patient will report decreased pain by 25% for improved sleep   Status Achieved           PT Long Term Goals - 03/30/16 1312    PT LONG TERM GOAL #1   Title The patient will be independent in safe, self progression of HEP for further strengthening of right shoulder   04/21/16   Time 8   Period Weeks   Status On-going   PT LONG TERM GOAL #2   Title The patient will have 150 degrees of elevation overhead for reaching higher shelves at work and home   Time 8   Period Weeks   Status On-going   PT LONG TERM GOAL #3   Title The patient will have shoulder and scapular muscle strength to grossly 4/5 needed for lifting light to medium objects at home and work.     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  Decreased motor control against gravity with min tactile cues to decrease compensatory shoulder hike.  On target with meeting goals.  Therapist closely monitoring movement patterns, pain and adherence to surgical precautions.      PT Next Visit Plan AAROM, AROM, low level strengthening progression to red band;  AROM in supine incline; supine stabilization with light weight      Patient will benefit from skilled therapeutic intervention in order to improve the following deficits and impairments:     Visit Diagnosis: Muscle weakness (generalized)  Pain in right shoulder  Stiffness of right shoulder, not elsewhere classified     Problem List There are no active problems to display for this patient.   Ruben Im, PT  03/30/2016 1:14 PM Phone: [phone] Fax: [phone]  [name] 03/30/2016, 1:14 PM  [facility] [address] [name], [place], [address] Phone: [phone]   Fax:  [phone]  Name: [name] MRN: [id number] Date of Birth: Jan 16, 1971

## 2016-04-04 ENCOUNTER — Ambulatory Visit: Payer: 59 | Admitting: Physical Therapy

## 2016-04-04 DIAGNOSIS — M25611 Stiffness of right shoulder, not elsewhere classified: Secondary | ICD-10-CM | POA: Diagnosis not present

## 2016-04-04 DIAGNOSIS — M6281 Muscle weakness (generalized): Secondary | ICD-10-CM

## 2016-04-04 DIAGNOSIS — M25522 Pain in left elbow: Secondary | ICD-10-CM | POA: Diagnosis not present

## 2016-04-04 DIAGNOSIS — M25511 Pain in right shoulder: Secondary | ICD-10-CM | POA: Diagnosis not present

## 2016-04-04 NOTE — Therapy (Signed)
[facility] [address], [place], [place], [address] Phone: [phone]   Fax:  [phone]  Physical Therapy Treatment  Patient Details  Name: [name] MRN: [id number] Date of Birth: 1971/09/20 Referring Provider: [name]  Encounter Date: 04/04/2016      PT End of Session - 04/04/16 1224    Visit Number 12   Number of Visits 16   Date for PT Re-Evaluation 04/21/16   PT Start Time 1100   PT Stop Time 1143   PT Time Calculation (min) 43 min   Activity Tolerance Patient tolerated treatment well      Past Medical History  Diagnosis Date  . GERD (gastroesophageal reflux disease)   . Migraines     [facility]  . History of kidney problems     1975, eval by 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 Depression     followed by [name]    Past Surgical History  Procedure Laterality Date  . Cesarean section  2007 & 2009  . Tubal ligation    . Cholecystectomy  2009  . Uterine ablation    . Cystoscopy  age 6  . Shoulder arthroscopy with subacromial decompression, rotator cuff repair and bicep tendon repair Right 02/11/2016    Procedure: RIGHT SHOULDER ARTHROSCOPY WITH SUBACROMIAL DECOMPRESSION, OPEN DCR,  OPEN MINI ROTATOR CUFF REPAIR, LABRAL REPAIR;  Surgeon: Netta Cedars, MD;  Location: [facility];  Service: Orthopedics;  Laterality: Right;    There were no vitals filed for this visit.      Subjective Assessment - 04/04/16 1104    Subjective Reports lack of motion and strength needed for reaching in the kids closet or putting sheets up on the shelf   Currently in Pain? No/denies   Pain Score 0-No pain   Pain Location Shoulder   Pain Orientation Right   Pain Type Surgical pain   Pain Onset More than a  month ago   Pain Frequency Intermittent            [facility] PT Assessment - 04/04/16 0001    AROM   Right/Left Shoulder Right   Right Shoulder Flexion 110 Degrees   Right Shoulder ABduction 90 Degrees   Right Shoulder Internal Rotation --  Sacrum   Right Shoulder External Rotation 38 Degrees                     [facility] Adult PT Treatment/Exercise - 04/04/16 0001    Shoulder Exercises: Standing   Protraction Strengthening;Right;20 reps;Weights   Protraction Weight (lbs) 1   Extension Strengthening;Right;15 reps;Theraband   Theraband Level (Shoulder Extension) Level 2 (Red)   Row Right;Theraband  triceps   Theraband Level (Shoulder Row) Level 2 (Red)   Other Standing Exercises ball on wall circles 45 sec clockwise/counter   Other Standing Exercises wall push ups 2x 10   Shoulder Exercises: Therapy Ball   Other Therapy Ball Exercises red ball up wall 12x   Shoulder Exercises: ROM/Strengthening   UBE (Upper Arm Bike) 8 min Forward only   Rhythmic Stabilization, Supine ABC 2# weight   Other ROM/Strengthening Exercises UE wall slides scaption 10x   Other ROM/Strengthening Exercises standing place/holds 6x 5 sec holds 90, 100.120 degrees   Manual Therapy   Manual  therapy comments PROM elevation in scaption; ER to 45 degrees   Joint Mobilization GH distraction and inferior grade 1/2                  PT Short Term Goals - 04/04/16 1700    PT SHORT TERM GOAL #1   Title The patient will have passive  right shoulder elevation to 125 degrees needed for preparation for AROM  when appropriate with surgical healing 03/24/16   Status Achieved   PT SHORT TERM GOAL #2   Title Active or active-assisted right shoulder elevation to 110 degrees needed for dressing and low level reaching   Status Achieved   PT SHORT TERM GOAL #3   Title The patient will have ER to 45 degrees needed for driving   Status Achieved   PT SHORT TERM GOAL #4   Title The patient will report  decreased pain by 25% for improved sleep   Status Achieved           PT Long Term Goals - 04/04/16 1700    PT LONG TERM GOAL #1   Title The patient will be independent in safe, self progression of HEP for further strengthening of right shoulder   04/21/16   Time 8   Period Weeks   Status On-going   PT LONG TERM GOAL #2   Title The patient will have 150 degrees of elevation overhead for reaching higher shelves at work and home   Time 8   Period Weeks   Status On-going   PT LONG TERM GOAL #3   Title The patient will have shoulder and scapular muscle strength to grossly 4/5 needed for lifting light to medium objects at home and work.     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  Min verbal and tactile cues to decrease compensatory shoulder hike.  Motor control against gravity is difficult and will need continued PT for progressive ROM and strengthening in standing.  Therapist closely monitoring response.  Minimal muscle soreness post session, patient prefers to ice at home.     PT Next Visit Plan Progressive AROM and strengthening against gravity as appropriate s/p surgery      Patient will benefit from skilled therapeutic intervention in order to improve the  following deficits and impairments:     Visit Diagnosis: Muscle weakness (generalized)  Pain in right shoulder  Stiffness of right shoulder, not elsewhere classified     Problem List There are no active problems to display for this patient.  Ruben Im, PT 04/04/2016 5:02 PM Phone: [phone] Fax: [phone]  [name] 04/04/2016, 5:02 PM  [facility] [address], [place], [place], [address] Phone: [phone]   Fax:  [phone]  Name: [name] MRN: [id number] Date of Birth: 07-12-1971

## 2016-04-06 ENCOUNTER — Encounter: Payer: 59 | Admitting: Physical Therapy

## 2016-04-07 ENCOUNTER — Ambulatory Visit: Payer: 59 | Admitting: Physical Therapy

## 2016-04-11 ENCOUNTER — Ambulatory Visit: Payer: 59 | Admitting: Physical Therapy

## 2016-04-11 DIAGNOSIS — M25511 Pain in right shoulder: Secondary | ICD-10-CM

## 2016-04-11 DIAGNOSIS — M6281 Muscle weakness (generalized): Secondary | ICD-10-CM

## 2016-04-11 DIAGNOSIS — M25611 Stiffness of right shoulder, not elsewhere classified: Secondary | ICD-10-CM

## 2016-04-11 DIAGNOSIS — M25522 Pain in left elbow: Secondary | ICD-10-CM | POA: Diagnosis not present

## 2016-04-11 NOTE — Therapy (Signed)
[facility] [address] [place], [place], [address] Phone: [phone]   Fax:  [phone]  Physical Therapy Treatment  Patient Details  Name: [name] MRN: [id number] Date of Birth: Aug 21, 1971 Referring Provider: [name]  Encounter Date: 04/11/2016      PT End of Session - 04/11/16 1050    Visit Number 13   Number of Visits 16   Date for PT Re-Evaluation 04/21/16   Authorization Type [facility] UMR   PT Start Time 1015   PT Stop Time 1055   PT Time Calculation (min) 40 min   Activity Tolerance Patient tolerated treatment well      Past Medical History  Diagnosis Date  . GERD (gastroesophageal reflux disease)   . Migraines     [facility]  . History of kidney problems     1975, eval by 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 Depression     followed by [name]    Past Surgical History  Procedure Laterality Date  . Cesarean section  2007 & 2009  . Tubal ligation    . Cholecystectomy  2009  . Uterine ablation    . Cystoscopy  age 27  . Shoulder arthroscopy with subacromial decompression, rotator cuff repair and bicep tendon repair Right 02/11/2016    Procedure: RIGHT SHOULDER ARTHROSCOPY WITH SUBACROMIAL DECOMPRESSION, OPEN DCR,  OPEN MINI ROTATOR CUFF REPAIR, LABRAL REPAIR;  Surgeon: Netta Cedars, MD;  Location: [facility];  Service: Orthopedics;  Laterality: Right;    There were no vitals filed for this visit.      Subjective Assessment - 04/11/16 1020    Subjective Achy right shoulder.  Worked this past weekend.     Currently in Pain? No/denies   Pain Score 0-No pain   Pain Location Shoulder   Pain Orientation Right            [facility] PT Assessment - 04/11/16 0001    AROM   Right Shoulder  Flexion 144 Degrees   Right Shoulder ABduction 132 Degrees   Right Shoulder Internal Rotation --  L5   Right Shoulder External Rotation 54 Degrees                     [facility] Adult PT Treatment/Exercise - 04/11/16 0001    Shoulder Exercises: Supine   Protraction Strengthening;Right;15 reps;Weights   Protraction Weight (lbs) 3   Flexion Strengthening;Right;10 reps   Shoulder Flexion Weight (lbs) 1   Other Supine Exercises incline 30 degrees 5x   Other Supine Exercises on incline 1# 12:00/6 and 9:00/3 15x each   Shoulder Exercises: Prone   Retraction Strengthening;Right;20 reps;Weights   Retraction Weight (lbs) 2   Extension Strengthening;Right;20 reps;Weights   Extension Weight (lbs) 2   Horizontal ABduction 1 Strengthening;Right;20 reps   Horizontal ABduction 1 Weight (lbs) 0   Horizontal ABduction 2 Strengthening;Right;20 reps   Horizontal ABduction 2 Weight (lbs) 0   Shoulder Exercises: Sidelying   External Rotation Strengthening;Right;20 reps;Weights   External Rotation Weight (lbs) 2   Shoulder Exercises: Standing   External Rotation Strengthening;Right;15 reps;Theraband   Theraband Level (Shoulder External Rotation) Level 2 (Red)   Internal Rotation Strengthening;Right;15 reps;Theraband   Theraband Level (Shoulder Internal Rotation) Level 2 (Red)   Flexion Strengthening;Right;Theraband  Theraband Level (Shoulder Flexion) Level 2 (Red)   Extension Strengthening;Right;15 reps;Theraband   Theraband Level (Shoulder Extension) Level 2 (Red)   Row Right;Theraband  triceps   Theraband Level (Shoulder Row) Level 2 (Red)   Other Standing Exercises wall push ups 2x 10   Shoulder Exercises: Therapy Ball   Other Therapy Ball Exercises ball on stair railing 25x   Shoulder Exercises: ROM/Strengthening   UBE (Upper Arm Bike) 6 min forward and backward   Other ROM/Strengthening Exercises standing place/holds 6x 5 sec holds 90, 100.120 degrees   Manual Therapy   Manual  therapy comments PROM elevation in scaption; ER to 45 degrees   Joint Mobilization GH distraction and inferior grade 1/2                  PT Short Term Goals - 04/11/16 1102    PT SHORT TERM GOAL #1   Title The patient will have passive  right shoulder elevation to 125 degrees needed for preparation for AROM  when appropriate with surgical healing 03/24/16   Status Achieved   PT SHORT TERM GOAL #2   Title Active or active-assisted right shoulder elevation to 110 degrees needed for dressing and low level reaching   Status Achieved   PT SHORT TERM GOAL #3   Title The patient will have ER to 45 degrees needed for driving   Status Achieved   PT SHORT TERM GOAL #4   Title The patient will report decreased pain by 25% for improved sleep   Status Achieved           PT Long Term Goals - 04/11/16 1117    PT LONG TERM GOAL #1   Title The patient will be independent in safe, self progression of HEP for further strengthening of right shoulder   04/21/16   Time 8   Period Weeks   Status On-going   PT LONG TERM GOAL #2   Title The patient will have 150 degrees of elevation overhead for reaching higher shelves at work and home   Time 8   Period Weeks   Status On-going   PT LONG TERM GOAL #3   Title The patient will have shoulder and scapular muscle strength to grossly 4/5 needed for lifting light to medium objects at home and work.     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  Motion against gravity is still difficult.  Minimal verbal and tactile cues to limit compensatory shoulder hike.  Reports minimal discomfort post session, will apply ice pack at home.  PT Next Visit Plan Progressive AROM and strengthening against gravity as appropriate s/p surgery      Patient will benefit from skilled therapeutic intervention in order to improve the following deficits and impairments:     Visit Diagnosis: Muscle weakness (generalized)  Pain in right shoulder  Stiffness of right shoulder, not elsewhere classified     Problem List There are no active problems to display for this patient.  Ruben Im, PT 04/11/2016 11:18 AM Phone: [phone] Fax: [phone]  [name] 04/11/2016, 11:18 AM  [facility] [address] [place], [place], [address] Phone: [phone]   Fax:  [phone]  Name: [name] MRN: [id number] Date of Birth: 1971/10/28

## 2016-04-13 ENCOUNTER — Encounter: Payer: Self-pay | Admitting: Physical Therapy

## 2016-04-13 ENCOUNTER — Ambulatory Visit: Payer: 59 | Admitting: Physical Therapy

## 2016-04-13 DIAGNOSIS — M25611 Stiffness of right shoulder, not elsewhere classified: Secondary | ICD-10-CM | POA: Diagnosis not present

## 2016-04-13 DIAGNOSIS — M25511 Pain in right shoulder: Secondary | ICD-10-CM | POA: Diagnosis not present

## 2016-04-13 DIAGNOSIS — M25522 Pain in left elbow: Secondary | ICD-10-CM | POA: Diagnosis not present

## 2016-04-13 DIAGNOSIS — M6281 Muscle weakness (generalized): Secondary | ICD-10-CM

## 2016-04-13 NOTE — Therapy (Signed)
[facility] [address] [place], [place], [address] Phone: [phone]   Fax:  [phone]  Physical Therapy Treatment  Patient Details  Name: [name] MRN: [id number] Date of Birth: 1970/12/06 Referring Provider: [name]  Encounter Date: 04/13/2016      PT End of Session - 04/13/16 1155    Visit Number 14   Number of Visits 16   Date for PT Re-Evaluation 04/21/16   Authorization Type [facility] UMR   PT Start Time [id number]   PT Stop Time 1230   PT Time Calculation (min) 45 min   Activity Tolerance Patient tolerated treatment well      Past Medical History  Diagnosis Date  . GERD (gastroesophageal reflux disease)   . Migraines     [facility]  . History of kidney problems     1975, eval by 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 Depression     followed by [name]    Past Surgical History  Procedure Laterality Date  . Cesarean section  2007 & 2009  . Tubal ligation    . Cholecystectomy  2009  . Uterine ablation    . Cystoscopy  age 59  . Shoulder arthroscopy with subacromial decompression, rotator cuff repair and bicep tendon repair Right 02/11/2016    Procedure: RIGHT SHOULDER ARTHROSCOPY WITH SUBACROMIAL DECOMPRESSION, OPEN DCR,  OPEN MINI ROTATOR CUFF REPAIR, LABRAL REPAIR;  Surgeon: Netta Cedars, MD;  Location: [place];  Service: Orthopedics;  Laterality: Right;    There were no vitals filed for this visit.      Subjective Assessment - 04/13/16 1152    Subjective Pt saw MD and he is very pleased with progression   Patient Stated Goals Back to work; [employer] and [employer]   Currently in Pain? No/denies                         [facility] Adult PT Treatment/Exercise - 04/13/16 0001    Shoulder Exercises: Supine   Protraction Strengthening;Right;15 reps;Weights   Protraction Weight (lbs) 3   Flexion Strengthening;Right;10 reps   Shoulder Flexion Weight (lbs) 1   Other Supine Exercises incline 30 degrees 5x   Other Supine Exercises on incline 1# 12:00/6 and 9:00/3 15x each   Shoulder Exercises: Prone   Retraction Strengthening;Right;20 reps;Weights   Retraction Weight (lbs) 2   Extension Strengthening;Right;20 reps;Weights   Extension Weight (lbs) 2   Horizontal ABduction 1 Strengthening;Right;20 reps   Horizontal ABduction 1 Weight (lbs) 0   Horizontal ABduction 2 Strengthening;Right;20 reps   Horizontal ABduction 2 Weight (lbs) 0   Shoulder Exercises: Sidelying   External Rotation Strengthening;Right;20 reps;Weights   External Rotation Weight (lbs) 2   ABduction AROM;20 reps  long lever   Shoulder Exercises: Standing   External Rotation Strengthening;Right;15 reps;Theraband   Theraband Level (Shoulder External Rotation) Level 2 (Red)   Internal Rotation Strengthening;Right;15 reps;Theraband   Theraband Level (Shoulder Internal Rotation) Level 2 (Red)   Flexion Strengthening;Right;Theraband   Theraband Level (Shoulder Flexion) Level 2 (Red)   Extension Strengthening;Right;15 reps;Theraband   Theraband Level (Shoulder Extension) Level 2 (Red)   Row Right;Theraband  triceps   Theraband Level (Shoulder Row) Level 2 (Red)   Other Standing Exercises wall push ups  2x 10   Shoulder Exercises: Therapy Ball   Other Therapy Ball Exercises ball on stair railing 25x   Shoulder Exercises: ROM/Strengthening   UBE (Upper Arm Bike) 6 min (3/3)   Manual Therapy   Manual therapy comments PROM elevation in scaption; ER to 45 degrees   Joint Mobilization GH distraction and inferior grade 1/2   Soft tissue mobilization gentle release of rhomboids, pectoralis major and minor                  PT Short Term Goals - 04/11/16 1102    PT SHORT TERM GOAL #1   Title The  patient will have passive  right shoulder elevation to 125 degrees needed for preparation for AROM  when appropriate with surgical healing 03/24/16   Status Achieved   PT SHORT TERM GOAL #2   Title Active or active-assisted right shoulder elevation to 110 degrees needed for dressing and low level reaching   Status Achieved   PT SHORT TERM GOAL #3   Title The patient will have ER to 45 degrees needed for driving   Status Achieved   PT SHORT TERM GOAL #4   Title The patient will report decreased pain by 25% for improved sleep   Status Achieved           PT Long Term Goals - 04/11/16 1117    PT LONG TERM GOAL #1   Title The patient will be independent in safe, self progression of HEP for further strengthening of right shoulder   04/21/16   Time 8   Period Weeks   Status On-going   PT LONG TERM GOAL #2   Title The patient will have 150 degrees of elevation overhead for reaching higher shelves at work and home   Time 8   Period Weeks   Status On-going   PT LONG TERM GOAL #3   Title The patient will have shoulder and scapular muscle strength to grossly 4/5 needed for lifting light to medium objects at home and work.     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 Pt was able for Rt shoulder AROM  into abduction in left sidelying. 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      Rehab Potential Good   PT Frequency 2x / week   PT Duration 8 weeks   PT Treatment/Interventions ADLs/Self Care Home Management;Cryotherapy;Electrical Stimulation;Therapeutic exercise;Patient/family education;Taping;Vasopneumatic Device;Manual techniques;Ultrasound;Neuromuscular re-education   PT Next Visit Plan  Progressive AROM and strengthening against gravity as appropriate s/p surgery   Consulted and Agree with Plan of Care Patient      Patient will benefit from skilled therapeutic intervention in order to improve the following deficits and impairments:  Decreased range of motion, Decreased strength, Hypomobility, Increased edema, Pain, Increased muscle spasms, Impaired UE functional use  Visit Diagnosis: Muscle weakness (generalized)  Pain in right shoulder  Stiffness of right shoulder, not elsewhere classified     Problem List There are no active problems to display for this patient.   NAUMANN-HOUEGNIFIO,Kael Keetch PTA 04/13/2016, 12:48 PM  [facility] [address], [place], [place], [address] Phone: [phone]   Fax:  [phone]  Name: [name] MRN: [id number] Date of Birth: 07/13/1971

## 2016-04-18 ENCOUNTER — Ambulatory Visit: Payer: 59 | Admitting: Physical Therapy

## 2016-04-18 DIAGNOSIS — M6281 Muscle weakness (generalized): Secondary | ICD-10-CM | POA: Diagnosis not present

## 2016-04-18 DIAGNOSIS — M25511 Pain in right shoulder: Secondary | ICD-10-CM

## 2016-04-18 DIAGNOSIS — M25611 Stiffness of right shoulder, not elsewhere classified: Secondary | ICD-10-CM

## 2016-04-18 DIAGNOSIS — M25522 Pain in left elbow: Secondary | ICD-10-CM

## 2016-04-18 NOTE — Therapy (Signed)
[facility] [address], [place], [place], [address] Phone: [phone]   Fax:  [phone]  Physical Therapy Treatment/Recertification  Patient Details  Name: [name] MRN: [id number] Date of Birth: 1971-02-27 Referring Provider: [name]  Encounter Date: 04/18/2016      PT End of Session - 04/18/16 1902    Visit Number 15   Number of Visits 30   Date for PT Re-Evaluation 06/13/16   Authorization Type [facility] UMR   PT Start Time 1100   PT Stop Time 1145   PT Time Calculation (min) 45 min   Activity Tolerance Patient tolerated treatment well      Past Medical History  Diagnosis Date  . GERD (gastroesophageal reflux disease)   . Migraines     [facility]  . History of kidney problems     1975, eval by 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 Depression     followed by [name]    Past Surgical History  Procedure Laterality Date  . Cesarean section  2007 & 2009  . Tubal ligation    . Cholecystectomy  2009  . Uterine ablation    . Cystoscopy  age 71  . Shoulder arthroscopy with subacromial decompression, rotator cuff repair and bicep tendon repair Right 02/11/2016    Procedure: RIGHT SHOULDER ARTHROSCOPY WITH SUBACROMIAL DECOMPRESSION, OPEN DCR,  OPEN MINI ROTATOR CUFF REPAIR, LABRAL REPAIR;  Surgeon: Netta Cedars, MD;  Location: [place];  Service: Orthopedics;  Laterality: Right;    There were no vitals filed for this visit.      Subjective Assessment - 04/18/16 1059    Subjective fPatient has new PT order for left elbow pain.  Right shoulder is more tender lately with increased use.  Left elbow pain with lifting the coffee pot or pitcher.  Flared up with increased use.     Currently in Pain? Yes    Pain Score 3    Pain Location Shoulder   Pain Orientation Right   Pain Type Surgical pain   Multiple Pain Sites Yes   Pain Score 2   Pain Location Elbow   Pain Orientation Left   Pain Type Chronic pain   Pain Onset More than a month ago   Pain Frequency Intermittent   Aggravating Factors  gripping, squeezing             [facility] PT Assessment - 04/18/16 0001    Observation/Other Assessments   Focus on Therapeutic Outcomes (FOTO)  37% limitation in elbow   AROM   AROM Assessment Site Elbow   Right Shoulder Flexion 130 Degrees   Right Shoulder ABduction 137 Degrees   Right Shoulder Internal Rotation --  L4-5   Right Shoulder External Rotation 53 Degrees   Strength   Strength Assessment Site Shoulder;Elbow;Hand;Wrist   Right/Left Shoulder Right   Right Shoulder Flexion 3/5   Right Shoulder Extension 3/5   Right Shoulder ABduction 3/5   Right Shoulder Internal Rotation 3/5   Right Shoulder External Rotation 3/5   Right/Left Elbow Left   Right/Left Wrist --  straight elbow 30# more painful   Right/Left hand --  right bent elbow 40#' straight 40#;  left 30# bent elbow5/10   Palpation   Palpation comment tender points in extensor  carpi radialis brevis                     [facility] Adult PT Treatment/Exercise - 04/18/16 0001    Shoulder Exercises: Supine   Other Supine Exercises incline 45 degrees 5x   Other Supine Exercises on incline 1# 12:00/6 and 9:00/3 15x each   Shoulder Exercises: Standing   Other Standing Exercises red ball on wall flexion 10x   Manual Therapy   Manual therapy comments PROM elevation in scaption; ER to 45 degrees   Joint Mobilization GH distraction and inferior grade 1/2   Soft tissue mobilization left extensor carpi radialis brevis   Other Manual Therapy mob with movement left elbow with wrist extension and gripping 3x 10          Trigger Point Dry Needling - 04/18/16 1900    Consent Given? Yes   Muscles Treated Upper Body --   left extensor carpi radialis, brachioradialis, supinator                PT Short Term Goals - 04/18/16 1917    PT SHORT TERM GOAL #1   Title The patient will have passive  right shoulder elevation to 125 degrees needed for preparation for AROM  when appropriate with surgical healing 03/24/16   Status Achieved   PT SHORT TERM GOAL #2   Title Active or active-assisted right shoulder elevation to 110 degrees needed for dressing and low level reaching   Status Achieved   PT SHORT TERM GOAL #3   Title The patient will have ER to 45 degrees needed for driving   Status Achieved   PT SHORT TERM GOAL #4   Title The patient will report decreased pain by 25% for improved sleep   Status Achieved           PT Long Term Goals - 04/18/16 1917    PT LONG TERM GOAL #1   Title The patient will be independent in safe, self progression of HEP for further strengthening of right shoulder   04/21/16   Time 8   Period Weeks   Status On-going   PT LONG TERM GOAL #2   Title The patient will have 150 degrees of elevation overhead for reaching higher shelves at work and home   Time 8   Period Weeks   Status On-going   PT LONG TERM GOAL #3   Title The patient will have shoulder and scapular muscle strength to grossly 4/5 needed for lifting light to medium objects at home and work.     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  Time 8   Period Weeks   Status New               Plan - 04/18/16 1902    Clinical Impression Statement The patient has a new order for left elbow pain in addition to continued to PT for rotator cuff repair 9 1/2 weeks s/p.  Good improvement in shoulder AROM but lacks full range of motion against gravity.  Glenohumeral and scapular strength grossly 4/5.  Signs and symptoms in left elbow consistent with lateral epicondylagia from overuse during shoulder surgery recovery.  Full AROM but lateral elbow pain with wrist extension and gripping especially with a straight elbow.  Strength is good but painful with resisted wrist extension and middle finger extension.  Grip strength is within 10# of right.  Tender points in extensor carpi radialis brevis, supinator and brachioradialis.  She would benefit from PT to address new diagnosis of elbow pain in addition to continued shoulder rehab.   Rehab Potential Good   PT Frequency 2x / week   PT Duration 8 weeks   PT Treatment/Interventions ADLs/Self Care Home Management;Cryotherapy;Electrical Stimulation;Therapeutic exercise;Patient/family education;Taping;Vasopneumatic Device;Manual techniques;Ultrasound;Neuromuscular re-education;Iontophoresis 4mg /ml Dexamethasone   PT Next Visit Plan Progressive AROM and strengthening against gravity as appropriate s/p surgery;  assess response to dry needling left elbow; mobs with movement, manual therapy; ionto patch if recert signed by MD      Patient will benefit from skilled therapeutic intervention in order to improve the following deficits and impairments:  Decreased range of motion, Decreased strength, Hypomobility, Increased edema, Pain, Increased muscle spasms, Impaired UE functional use  Visit Diagnosis: Muscle weakness (generalized) - Plan: PT plan of care cert/re-cert  Pain in right shoulder - Plan: PT plan of care  cert/re-cert  Stiffness of right shoulder, not elsewhere classified - Plan: PT plan of care cert/re-cert  Pain in left elbow - Plan: PT plan of care cert/re-cert     Problem List There are no active problems to display for this patient.   Ruben Im, PT 04/18/2016 7:24 PM Phone: [phone] Fax: [phone]  [name] 04/18/2016, 7:24 PM   [facility] [address] [place], [place], [address] Phone: [phone]   Fax:  [phone]  Name: [name] MRN: [id number] Date of Birth: 12-28-1970

## 2016-04-20 ENCOUNTER — Ambulatory Visit: Payer: 59 | Admitting: Physical Therapy

## 2016-04-20 DIAGNOSIS — M25611 Stiffness of right shoulder, not elsewhere classified: Secondary | ICD-10-CM | POA: Diagnosis not present

## 2016-04-20 DIAGNOSIS — M6281 Muscle weakness (generalized): Secondary | ICD-10-CM | POA: Diagnosis not present

## 2016-04-20 DIAGNOSIS — M25511 Pain in right shoulder: Secondary | ICD-10-CM

## 2016-04-20 DIAGNOSIS — M25522 Pain in left elbow: Secondary | ICD-10-CM | POA: Diagnosis not present

## 2016-04-20 NOTE — Therapy (Signed)
[facility] [address] [place], [place], [address] Phone: [phone]   Fax:  [phone]  Physical Therapy Treatment  Patient Details  Name: [name] MRN: [id number] Date of Birth: 1971-06-22 Referring Provider: [name]  Encounter Date: 04/20/2016      PT End of Session - 04/20/16 1202    Visit Number 16   Number of Visits 30   Date for PT Re-Evaluation 06/13/16   Authorization Type [facility] UMR   PT Start Time 1100   PT Stop Time 1147   PT Time Calculation (min) 47 min   Activity Tolerance Patient tolerated treatment well      Past Medical History  Diagnosis Date  . GERD (gastroesophageal reflux disease)   . Migraines     [facility]  . History of kidney problems     1975, eval by 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 Depression     followed by [name]    Past Surgical History  Procedure Laterality Date  . Cesarean section  2007 & 2009  . Tubal ligation    . Cholecystectomy  2009  . Uterine ablation    . Cystoscopy  age 87  . Shoulder arthroscopy with subacromial decompression, rotator cuff repair and bicep tendon repair Right 02/11/2016    Procedure: RIGHT SHOULDER ARTHROSCOPY WITH SUBACROMIAL DECOMPRESSION, OPEN DCR,  OPEN MINI ROTATOR CUFF REPAIR, LABRAL REPAIR;  Surgeon: Netta Cedars, MD;  Location: [facility];  Service: Orthopedics;  Laterality: Right;    There were no vitals filed for this visit.      Subjective Assessment - 04/20/16 1105    Subjective No immediate changes in left elbow pain.  Right shoulder is tender near incisions.   Currently in Pain? Yes   Pain Score 3    Pain Location Shoulder   Pain Orientation Right   Pain Frequency Intermittent   Pain Score 3   Pain Location  Elbow   Pain Orientation Left   Pain Type Chronic pain                         [facility] Adult PT Treatment/Exercise - 04/20/16 0001    Shoulder Exercises: Standing   Extension Strengthening;Right;15 reps;Theraband  Diagonal extension D1 and D2   Row Both;20 reps;Weights   Row Weight (lbs) power tower 15#   Other Standing Exercises body blade 3 positions 2x 15-30 sec each   Modalities   Modalities Iontophoresis   Iontophoresis   Type of Iontophoresis Dexamethasone   Location left elbow   Dose 1 cc 4 mg/ml   Time 4-6 hour patch   Manual Therapy   Joint Mobilization GH distraction and inferior grade 1/2   Soft tissue mobilization left extensor carpi radialis brevis   Other Manual Therapy mob with movement left elbow with wrist extension and gripping 3x 10                PT Education - 04/20/16 1202    Education provided Yes   Education Details ionto instructions   Person(s) Educated Patient   Methods Explanation;Handout   Comprehension Verbalized understanding          PT Short Term Goals - 04/20/16 1207    PT SHORT TERM  GOAL #1   Title The patient will have passive  right shoulder elevation to 125 degrees needed for preparation for AROM  when appropriate with surgical healing 03/24/16   Status Achieved   PT SHORT TERM GOAL #2   Title Active or active-assisted right shoulder elevation to 110 degrees needed for dressing and low level reaching   Status Achieved   PT SHORT TERM GOAL #3   Title The patient will have ER to 45 degrees needed for driving   Status Achieved   PT SHORT TERM GOAL #4   Title The patient will report decreased pain by 25% for improved sleep   Status Achieved           PT Long Term Goals - 04/20/16 1208    PT LONG TERM GOAL #1   Title The patient will be independent in safe, self progression of HEP for further strengthening of right shoulder   04/21/16   Time 8   Period Weeks   Status On-going   PT LONG TERM GOAL #2    Title The patient will have 150 degrees of elevation overhead for reaching higher shelves at work and home   Time 8   Period Weeks   Status On-going   PT LONG TERM GOAL #3   Title The patient will have shoulder and scapular muscle strength to grossly 4/5 needed for lifting light to medium objects at home and work.     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     Time 8   Period Weeks   Status On-going               Plan - 04/20/16 1202    Clinical Impression Statement The patient is improving with right shoulder AROM and motor control.  Minimal verbal and tactile cues to decrease compensatory shoulder shrug.   Decreased myofascial tender points in left forearm extensor muscle wad.  Therapist closely monitoring response to all interventions.     PT Next Visit Plan assess response to ionto patch #1; continue dry needling to left elbow;  manual therapy right shoulder;  progressive strengthening right shoulder      Patient will benefit from skilled therapeutic intervention in order to improve the following deficits and impairments:     Visit Diagnosis: Muscle weakness (generalized)  Pain in right shoulder  Stiffness of right shoulder, not elsewhere classified  Pain in left elbow     Problem List There are no active problems to display for  this patient.   Ruben Im, PT 04/20/2016 12:12 PM Phone: [phone] Fax: [phone]  [name] 04/20/2016, 12:12 PM  [facility] [address], [place], [place], [address] Phone: [phone]   Fax:  [phone]  Name: ELIZABETHROSE JOHANSEN MRN: [id number] Date of Birth: 02-12-1971

## 2016-04-20 NOTE — Patient Instructions (Signed)

## 2016-04-25 ENCOUNTER — Ambulatory Visit: Payer: 59 | Admitting: Physical Therapy

## 2016-04-25 DIAGNOSIS — M25611 Stiffness of right shoulder, not elsewhere classified: Secondary | ICD-10-CM

## 2016-04-25 DIAGNOSIS — M25522 Pain in left elbow: Secondary | ICD-10-CM

## 2016-04-25 DIAGNOSIS — M6281 Muscle weakness (generalized): Secondary | ICD-10-CM | POA: Diagnosis not present

## 2016-04-25 DIAGNOSIS — M25511 Pain in right shoulder: Secondary | ICD-10-CM | POA: Diagnosis not present

## 2016-04-25 NOTE — Therapy (Signed)
[facility] [address], [place], [place], [address] Phone: [phone]   Fax:  [phone]  Physical Therapy Treatment  Patient Details  Name: [name] MRN: [id number] Date of Birth: 04-14-1971 Referring Provider: [name]  Encounter Date: 04/25/2016      PT End of Session - 04/25/16 1858    Visit Number 17   Number of Visits 30   Date for PT Re-Evaluation 06/13/16   Authorization Type [facility] UMR   PT Start Time 1105   PT Stop Time 1200   PT Time Calculation (min) 55 min   Activity Tolerance Patient tolerated treatment well      Past Medical History  Diagnosis Date  . GERD (gastroesophageal reflux disease)   . Migraines     [facility]  . History of kidney problems     1975, eval by 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 Depression     followed by [name]    Past Surgical History  Procedure Laterality Date  . Cesarean section  2007 & 2009  . Tubal ligation    . Cholecystectomy  2009  . Uterine ablation    . Cystoscopy  age 45  . Shoulder arthroscopy with subacromial decompression, rotator cuff repair and bicep tendon repair Right 02/11/2016    Procedure: RIGHT SHOULDER ARTHROSCOPY WITH SUBACROMIAL DECOMPRESSION, OPEN DCR,  OPEN MINI ROTATOR CUFF REPAIR, LABRAL REPAIR;  Surgeon: Netta Cedars, MD;  Location: [place];  Service: Orthopedics;  Laterality: Right;    There were no vitals filed for this visit.      Subjective Assessment - 04/25/16 1125    Subjective I need to stretch my shoulder more.  Elbow did well at work until Monday it was bothering me.  I feel my elbow more after it has been straight a while.        0/10 pain at present in right shoulder and left elbow.                      [place] [facility] [place] Adult PT Treatment/Exercise - 04/25/16 0001    Shoulder Exercises: Standing   Flexion Strengthening;Right;Weights;20 reps   Shoulder Flexion Weight (lbs) 1   ABduction Strengthening;Right;20 reps;Weights   Theraband Level (Shoulder ABduction) --  scaption   Shoulder ABduction Weight (lbs) 1   Shoulder Exercises: Therapy Ball   Other Therapy Ball Exercises ball circles on wall 15x   Shoulder Exercises: Power Warehouse manager Limitations 15# seated Bilateral 2x 10   Other Power Tower Exercises lat bar seated 15# bilateral 2x 10   Iontophoresis   Type of Iontophoresis Dexamethasone   Location left elbow   Dose 1 cc 4 mg/ml   Time 4-6 hour patch   Manual Therapy   Joint Mobilization GH distraction and inferior grade 3 mobs with movement   Soft tissue mobilization left extensor carpi radialis brevis          Trigger Point Dry Needling - 04/25/16 1856    Consent Given? Yes   Muscles Treated Upper Body --  left extensor carpi radialis brevis, supinator, brachioradia                PT Short Term Goals - 04/25/16 1902    PT SHORT TERM  GOAL #1   Title The patient will have passive  right shoulder elevation to 125 degrees needed for preparation for AROM  when appropriate with surgical healing 03/24/16   Status Achieved   PT SHORT TERM GOAL #2   Title Active or active-assisted right shoulder elevation to 110 degrees needed for dressing and low level reaching   Status Achieved   PT SHORT TERM GOAL #3   Title The patient will have ER to 45 degrees needed for driving   Status Achieved   PT SHORT TERM GOAL #4   Title The patient will report decreased pain by 25% for improved sleep   Status Achieved           PT Long Term Goals - 04/25/16 1902    PT LONG TERM GOAL #1   Title The patient will be independent in safe, self progression of HEP for further strengthening of right shoulder   04/21/16   Time 8   Period Weeks   Status On-going    PT LONG TERM GOAL #2   Title The patient will have 150 degrees of elevation overhead for reaching higher shelves at work and home   Time 8   Period Weeks   Status On-going   PT LONG TERM GOAL #3   Title The patient will have shoulder and scapular muscle strength to grossly 4/5 needed for lifting light to medium objects at home and work.     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     Time 8   Period Weeks   Status On-going               Plan - 04/25/16 1858    Clinical Impression Statement Improving with right shoulder AROM using mirror for feedback to decrease compensatory shoulder hike.   Passive ROM is nearly full.  Decreasing tender points in left wrist extensor muscles.  Therapist closely monitoring response to all interventions.     PT Next Visit Plan assess response to ionto patch #2; continue dry needling to left elbow;  manual therapy right shoulder;  progressive strengthening right shoulder;  recheck shoulder AROM, MMT      Patient will benefit from skilled therapeutic intervention in order to improve the following deficits and impairments:     Visit Diagnosis: Muscle weakness (generalized)  Pain in right shoulder  Stiffness of right shoulder, not elsewhere classified  Pain in left elbow     Problem List There  are no active problems to display for this patient.   Ruben Im, PT 04/25/2016 7:05 PM Phone: [phone] Fax: [phone]  [name] 04/25/2016, 7:04  PM  [facility] [address] [place], [place], [address] Phone: [phone]   Fax:  [phone]  Name: [name] MRN: [id number] Date of Birth: 01-12-1971

## 2016-04-27 ENCOUNTER — Ambulatory Visit: Payer: 59 | Attending: Orthopedic Surgery | Admitting: Physical Therapy

## 2016-04-27 DIAGNOSIS — M25522 Pain in left elbow: Secondary | ICD-10-CM | POA: Diagnosis not present

## 2016-04-27 DIAGNOSIS — M25611 Stiffness of right shoulder, not elsewhere classified: Secondary | ICD-10-CM | POA: Diagnosis not present

## 2016-04-27 DIAGNOSIS — M6281 Muscle weakness (generalized): Secondary | ICD-10-CM | POA: Diagnosis not present

## 2016-04-27 DIAGNOSIS — M25511 Pain in right shoulder: Secondary | ICD-10-CM | POA: Insufficient documentation

## 2016-04-27 NOTE — Therapy (Signed)
[facility] [address], [place], [place], [address] Phone: [phone]   Fax:  [phone]  Physical Therapy Treatment  Patient Details  Name: [name] MRN: [id number] Date of Birth: 1971/09/19 Referring Provider: [name]  Encounter Date: 04/27/2016      PT End of Session - 04/27/16 1152    Visit Number 18   Number of Visits 30   Date for PT Re-Evaluation 06/13/16   Authorization Type [facility] UMR   PT Start Time 1100   PT Stop Time 1145   PT Time Calculation (min) 45 min   Activity Tolerance Patient tolerated treatment well      Past Medical History  Diagnosis Date  . GERD (gastroesophageal reflux disease)   . Migraines     [facility]  . History of kidney problems     1975, eval by 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 Depression     followed by [name]    Past Surgical History  Procedure Laterality Date  . Cesarean section  2007 & 2009  . Tubal ligation    . Cholecystectomy  2009  . Uterine ablation    . Cystoscopy  age 45  . Shoulder arthroscopy with subacromial decompression, rotator cuff repair and bicep tendon repair Right 02/11/2016    Procedure: RIGHT SHOULDER ARTHROSCOPY WITH SUBACROMIAL DECOMPRESSION, OPEN DCR,  OPEN MINI ROTATOR CUFF REPAIR, LABRAL REPAIR;  Surgeon: Netta Cedars, MD;  Location: [facility];  Service: Orthopedics;  Laterality: Right;    There were no vitals filed for this visit.      Subjective Assessment - 04/27/16 1103    Subjective I just walked 2 miles.  My elbow is a little today.  I notice it with full elbow extension but I didn't notice it with lifting/gripping the coffee pot.  I worked my arm in the pool  yesterday and it felt good .   Currently in Pain? No/denies    Pain Score 0-No pain   Pain Location Shoulder   Pain Orientation Right   Pain Score 2   Pain Location Elbow   Pain Orientation Left   Pain Onset More than a month ago            [facility] PT Assessment - 04/27/16 0001    AROM   Right Shoulder Flexion 143 Degrees   Right Shoulder ABduction 153 Degrees   Right Shoulder Internal Rotation --  L1   Right Shoulder External Rotation 60 Degrees                     [facility] Adult PT Treatment/Exercise - 04/27/16 0001    Shoulder Exercises: Seated   Other Seated Exercises Nu-Step L2 6 min   Shoulder Exercises: Prone   Other Prone Exercises quadruped weight shifting on UEs in multi planes   Shoulder Exercises: Standing   Row Weight (lbs) power tower 20#   Other Standing Exercises 20# extension 15x   Other Standing Exercises body blade 3 positions 30 sec 3 rounds 30 sec   Shoulder Exercises: Therapy Ball   Other Therapy Ball Exercises red ball toss with bounce and no bounce   Shoulder Exercises: Power Warden/ranger Exercises lat bar seated 15# bilateral 2x 10  Iontophoresis   Type of Iontophoresis Dexamethasone   Location left elbow   Dose 1 cc 4 mg/ml #3   Time 4-6 hour patch   Manual Therapy   Joint Mobilization GH distraction and inferior grade 3 mobs with movement   Soft tissue mobilization left extensor carpi radialis brevis                  PT Short Term Goals - 04/27/16 1203    PT SHORT TERM GOAL #1   Title The patient will have passive  right shoulder elevation to 125 degrees needed for preparation for AROM  when appropriate with surgical healing 03/24/16   Status Achieved   PT SHORT TERM GOAL #2   Title Active or active-assisted right shoulder elevation to 110 degrees needed for dressing and low level reaching   Status Achieved   PT SHORT TERM GOAL #3   Title The patient will have ER to 45 degrees needed for driving   Status Achieved   PT SHORT TERM GOAL #4   Title The patient will  report decreased pain by 25% for improved sleep   Status Achieved           PT Long Term Goals - 04/27/16 1203    PT LONG TERM GOAL #1   Title The patient will be independent in safe, self progression of HEP for further strengthening of right shoulder   04/21/16   Time 8   Period Weeks   Status On-going   PT LONG TERM GOAL #2   Title The patient will have 150 degrees of elevation overhead for reaching higher shelves at work and home   Time 8   Period Weeks   Status On-going   PT LONG TERM GOAL #3   Title The patient will have shoulder and scapular muscle strength to grossly 4/5 needed for lifting light to medium objects at home and work.     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     Time 8   Period Weeks   Status On-going               Plan - 04/27/16 1153    Clinical Impression Statement Good improvement in shoulder AROM in all planes.  Improving with motor control with functional movement.  Minimal cues to decrease compensatory shoulder hike.  Improving glenohumeral muscle lengths.  Therapist closely monitoring response and proper form with all exercise.     PT Next Visit Plan assess response to ionto patch #3; continue dry needling to left elbow;  manual therapy right shoulder;  progressive strengthening right shoulder  Patient will benefit from skilled therapeutic intervention in order to improve the following deficits and impairments:     Visit Diagnosis: Muscle weakness (generalized)  Pain in right shoulder  Stiffness of right shoulder, not elsewhere classified  Pain in left elbow     Problem List There are no active problems to display for this patient.  Ruben Im, PT 04/27/2016 12:05 PM Phone: [phone] Fax: [phone]  [name] 04/27/2016, 12:05 PM  [facility] [address], [place], [place], [address] Phone: [phone]   Fax:  [phone]  Name: [name] MRN: [id number] Date of Birth: 01/21/1971

## 2016-05-02 ENCOUNTER — Encounter: Payer: Self-pay | Admitting: Physical Therapy

## 2016-05-02 ENCOUNTER — Ambulatory Visit: Payer: 59 | Admitting: Physical Therapy

## 2016-05-02 DIAGNOSIS — M25611 Stiffness of right shoulder, not elsewhere classified: Secondary | ICD-10-CM

## 2016-05-02 DIAGNOSIS — M6281 Muscle weakness (generalized): Secondary | ICD-10-CM | POA: Diagnosis not present

## 2016-05-02 DIAGNOSIS — M25522 Pain in left elbow: Secondary | ICD-10-CM

## 2016-05-02 DIAGNOSIS — M25511 Pain in right shoulder: Secondary | ICD-10-CM | POA: Diagnosis not present

## 2016-05-02 NOTE — Therapy (Signed)
[facility] [address] [place], [place], [address] Phone: [phone]   Fax:  [phone]  Physical Therapy Treatment  Patient Details  Name: [name] MRN: [id number] Date of Birth: 1971/11/16 Referring Provider: [name]  Encounter Date: 05/02/2016      PT End of Session - 05/02/16 1111    Visit Number 19   Number of Visits 30   Date for PT Re-Evaluation 06/13/16   Authorization Type [facility] UMR   PT Start Time 1102   PT Stop Time 1145   PT Time Calculation (min) 43 min   Activity Tolerance Patient tolerated treatment well      Past Medical History  Diagnosis Date  . GERD (gastroesophageal reflux disease)   . Migraines     [facility]  . History of kidney problems     1975, eval by 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 Depression     followed by [name]    Past Surgical History  Procedure Laterality Date  . Cesarean section  2007 & 2009  . Tubal ligation    . Cholecystectomy  2009  . Uterine ablation    . Cystoscopy  age 17  . Shoulder arthroscopy with subacromial decompression, rotator cuff repair and bicep tendon repair Right 02/11/2016    Procedure: RIGHT SHOULDER ARTHROSCOPY WITH SUBACROMIAL DECOMPRESSION, OPEN DCR,  OPEN MINI ROTATOR CUFF REPAIR, LABRAL REPAIR;  Surgeon: Netta Cedars, MD;  Location: [facility];  Service: Orthopedics;  Laterality: Right;    There were no vitals filed for this visit.      Subjective Assessment - 05/02/16 1108    Subjective Rt shoulder is feeling pretty good, however left elbow hurts at times with lifting/gripping coffee pot, may be due to overuse to protect Rt shoulder.    Patient Stated Goals Back to work; [employer]   Currently in Pain? No/denies                          [facility] Adult PT Treatment/Exercise - 05/02/16 0001    Shoulder Exercises: Seated   Other Seated Exercises UBE level 2 x44min (3/3)   Shoulder Exercises: Prone   Other Prone Exercises quadruped weight shifting on UEs in multi planes   Shoulder Exercises: Standing   Row Weight (lbs) power tower 25#   Other Standing Exercises 20# extension 15x   Other Standing Exercises body blade 3 positions 30 sec 3 rounds 30 sec   Shoulder Exercises: Power Futures trader lat bar seated 15# bilateral32x 10   Modalities   Modalities Iontophoresis   Iontophoresis   Type of Iontophoresis Dexamethasone   Location left lat elbow   Dose #4 1cc   Time 4-6 hour patch   Manual Therapy   Joint Mobilization GH distraction and inferior grade 3 mobs with movement   Soft tissue mobilization left extensor carpi radialis brevis                  PT Short Term Goals - 04/27/16 1203    PT SHORT TERM GOAL #1   Title The patient will have passive  right shoulder elevation to 125 degrees needed for preparation for AROM  when appropriate with  surgical healing 03/24/16   Status Achieved   PT SHORT TERM GOAL #2   Title Active or active-assisted right shoulder elevation to 110 degrees needed for dressing and low level reaching   Status Achieved   PT SHORT TERM GOAL #3   Title The patient will have ER to 45 degrees needed for driving   Status Achieved   PT SHORT TERM GOAL #4   Title The patient will report decreased pain by 25% for improved sleep   Status Achieved           PT Long Term Goals - 05/02/16 1119    PT LONG TERM GOAL #1   Title The patient will be independent in safe, self progression of HEP for further strengthening of right shoulder   04/21/16   Time 8   Period Weeks   Status On-going   PT LONG TERM GOAL #2   Title The patient will have 150 degrees of elevation overhead for reaching higher shelves at work and home   Time 8    Period Weeks   Status On-going   PT LONG TERM GOAL #3   Title The patient will have shoulder and scapular muscle strength to grossly 4/5 needed for lifting light to medium objects at home and work.     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     Time 8   Period Weeks   Status On-going               Plan - 05/02/16 1112    Clinical Impression Statement Pt tolerates strenthening exercises well and shows good AROM Rt shoulder. Pt will continue to benefit from skillet PT.    Rehab Potential Good   PT Frequency 2x / week   PT Treatment/Interventions ADLs/Self Care Home Management;Cryotherapy;Electrical Stimulation;Therapeutic exercise;Patient/family education;Taping;Vasopneumatic Device;Manual techniques;Ultrasound;Neuromuscular re-education;Iontophoresis 4mg /ml Dexamethasone   PT Next Visit Plan [place]onto patch # 5, Manual therapy Rt shoulder, progressive Right shoulder strengthening    Consulted and Agree with Plan of Care Patient      Patient will benefit from skilled therapeutic intervention in order to improve the following deficits and impairments:  Decreased range of motion, Decreased strength, Hypomobility, Increased edema, Pain, Increased muscle spasms, Impaired UE functional use  Visit Diagnosis: Muscle weakness (generalized)  Pain  in right shoulder  Stiffness of right shoulder, not elsewhere classified  Pain in left elbow     Problem List There are no active problems to display for this patient.   NAUMANN-HOUEGNIFIO,Yasaman Kolek PTA 05/02/2016, 12:01 PM  [facility] [address], [place], [place], [address] Phone: [phone]  Fax:  [phone]  Name: [name] MRN: [id number] Date of Birth: [birth year]

## 2016-05-05 ENCOUNTER — Ambulatory Visit: Payer: 59 | Admitting: Physical Therapy

## 2016-05-09 ENCOUNTER — Ambulatory Visit: Payer: 59 | Admitting: Physical Therapy

## 2016-05-09 DIAGNOSIS — M25522 Pain in left elbow: Secondary | ICD-10-CM

## 2016-05-09 DIAGNOSIS — M25611 Stiffness of right shoulder, not elsewhere classified: Secondary | ICD-10-CM | POA: Diagnosis not present

## 2016-05-09 DIAGNOSIS — M6281 Muscle weakness (generalized): Secondary | ICD-10-CM | POA: Diagnosis not present

## 2016-05-09 DIAGNOSIS — M25511 Pain in right shoulder: Secondary | ICD-10-CM | POA: Diagnosis not present

## 2016-05-09 NOTE — Therapy (Signed)
[facility] [address] [place], [place], [address] Phone: [phone]   Fax:  [phone]  Physical Therapy Treatment  Patient Details  Name: [name] MRN: [id number] Date of Birth: 1971/06/06 Referring Provider: [name]  Encounter Date: 05/09/2016      PT End of Session - 05/09/16 1635    Visit Number 20   Number of Visits 30   Date for PT Re-Evaluation 06/13/16   Authorization Type [facility] U[facility]   PT Start Time 1015   PT Stop Time 1100   PT Time Calculation (min) 45 min   Activity Tolerance Patient tolerated treatment well      Past Medical History  Diagnosis Date  . GERD (gastroesophageal reflux disease)   . Migraines     [facility]  . History of kidney problems     1975, eval by 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 Depression     followed by [name]    Past Surgical History  Procedure Laterality Date  . Cesarean section  2007 & 2009  . Tubal ligation    . Cholecystectomy  2009  . Uterine ablation    . Cystoscopy  age 45  . Shoulder arthroscopy with subacromial decompression, rotator cuff repair and bicep tendon repair Right 02/11/2016    Procedure: RIGHT SHOULDER ARTHROSCOPY WITH SUBACROMIAL DECOMPRESSION, OPEN DCR,  OPEN MINI ROTATOR CUFF REPAIR, LABRAL REPAIR;  Surgeon: Netta Cedars, MD;  Location: [facility];  Service: Orthopedics;  Laterality: Right;    There were no vitals filed for this visit.      Subjective Assessment - 05/09/16 1020    Subjective Worked on Saturday and it was busy.  Pushed the stretchers, using left arm to steer which bothers elbow a little.  The elbow pain comes and goes painwise but intensity is less.  I can sleep on right side now.  I tried to swim but couldn't make  a full arc.     Currently in Pain? Yes   Pain Score 2    Pain Location Elbow   Pain Orientation Left   Pain Score 0   Pain Location Shoulder   Pain Orientation Right                         [facility] Adult PT Treatment/Exercise - 05/09/16 0001    Shoulder Exercises: Seated   Other Seated Exercises seated push up plus 12x   Other Seated Exercises UBE level 2 x7 min    Shoulder Exercises: Standing   Row Weight (lbs) power tower 25#   Other Standing Exercises lat pull down 25# 20x   Other Standing Exercises single arm weight bearing on wobble board with weight shifting4x 10   Shoulder Exercises: Stretch   Corner Stretch 3 reps;20 seconds   Cross Chest Stretch 3 reps;20 seconds   Internal Rotation Stretch 10 seconds  with towel and counter top   Iontophoresis   Type of Iontophoresis Dexamethasone   Location left lat elbow   Dose #5 1cc   Time 4-6 hour patch   Manual Therapy   Manual Therapy Joint mobilization;Other (comment)   Joint Mobilization GH distraction and inferior grade 3 mobs with movement   Soft tissue mobilization left extensor carpi  radialis brevis          Trigger Point Dry Needling - 05/09/16 1634    Consent Given? Yes   Muscles Treated Upper Body --  left supinator                PT Short Term Goals - 05/09/16 1643    PT SHORT TERM GOAL #1   Title The patient will have passive  right shoulder elevation to 125 degrees needed for preparation for AROM  when appropriate with surgical healing 03/24/16   Status Achieved   PT SHORT TERM GOAL #2   Title Active or active-assisted right shoulder elevation to 110 degrees needed for dressing and low level reaching   Status Achieved   PT SHORT TERM GOAL #3   Title The patient will have ER to 45 degrees needed for driving   Status Achieved   PT SHORT TERM GOAL #4   Title The patient will report decreased pain by 25% for improved sleep   Status Achieved           PT Long Term Goals -  05/09/16 1643    PT LONG TERM GOAL #1   Title The patient will be independent in safe, self progression of HEP for further strengthening of right shoulder   04/21/16   Time 8   Period Weeks   Status On-going   PT LONG TERM GOAL #2   Title The patient will have 150 degrees of elevation overhead for reaching higher shelves at work and home   Time 8   Period Weeks   Status On-going   PT LONG TERM GOAL #3   Title The patient will have shoulder and scapular muscle strength to grossly 4/5 needed for lifting light to medium objects at home and work.     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     Time 8   Period Weeks   Status On-going               Plan - 05/09/16 1635    Clinical Impression Statement The patient is improving in left shoulder motor control and ROM as expected 12 1/2 weeks s/p repair of large rotator cuff tear.  Left elbow pain persists but decreased in pain intensity with lifting/gripping.  Decreased tender points in forearm extensor wad overall.  Therapist closely monitoring response with all.     PT Next Visit Plan [place] patch # 6, Manual therapy Rt shoulder, progressive Right shoulder strengthening;  left soft tissue work, dry needling as needed, ? kinesiotaping;  recheck shoulder AROM      Patient will benefit  from  skilled therapeutic intervention in order to improve the following deficits and impairments:     Visit Diagnosis: Muscle weakness (generalized)  Pain in right shoulder  Stiffness of right shoulder, not elsewhere classified  Pain in left elbow     Problem List There are no active problems to display for this patient.  Ruben Im, PT 05/09/2016 4:46 PM Phone: [phone] Fax: [phone]  [name] 05/09/2016, 4:45 PM  [facility] [address] [place], [place], [address] Phone: [phone]   Fax:  [phone]  Name: KALYNE DUNGEE MRN: [id number] Date of Birth: 17-Sep-1971

## 2016-05-11 ENCOUNTER — Ambulatory Visit: Payer: 59 | Admitting: Physical Therapy

## 2016-05-11 DIAGNOSIS — M25611 Stiffness of right shoulder, not elsewhere classified: Secondary | ICD-10-CM

## 2016-05-11 DIAGNOSIS — M25511 Pain in right shoulder: Secondary | ICD-10-CM

## 2016-05-11 DIAGNOSIS — M6281 Muscle weakness (generalized): Secondary | ICD-10-CM | POA: Diagnosis not present

## 2016-05-11 DIAGNOSIS — M25522 Pain in left elbow: Secondary | ICD-10-CM | POA: Diagnosis not present

## 2016-05-11 NOTE — Therapy (Signed)
[facility] [address] [place], [place], [address] Phone: [phone]   Fax:  [phone]  Physical Therapy Treatment  Patient Details  Name: [name] MRN: [id number] Date of Birth: [birth year] Referring Provider: [name]  Encounter Date: 05/11/2016      PT End of Session - 05/11/16 1931    Visit Number 21   Number of Visits 30   Date for PT Re-Evaluation 06/13/16   Authorization Type [facility] U[facility]   PT Start Time 1100   PT Stop Time 1146   PT Time Calculation (min) 46 min      Past Medical History  Diagnosis Date  . GERD (gastroesophageal reflux disease)   . Migraines     [facility]  . History of kidney problems     1975, eval by 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 Depression     followed by [name]    Past Surgical History  Procedure Laterality Date  . Cesarean section  2007 & 2009  . Tubal ligation    . Cholecystectomy  2009  . Uterine ablation    . Cystoscopy  age 66  . Shoulder arthroscopy with subacromial decompression, rotator cuff repair and bicep tendon repair Right 02/11/2016    Procedure: RIGHT SHOULDER ARTHROSCOPY WITH SUBACROMIAL DECOMPRESSION, OPEN DCR,  OPEN MINI ROTATOR CUFF REPAIR, LABRAL REPAIR;  Surgeon: Netta Cedars, MD;  Location: [facility];  Service: Orthopedics;  Laterality: Right;    There were no vitals filed for this visit.      Subjective Assessment - 05/11/16 1101    Subjective No complaints.  A little sore after last time but not too bad.  Soreness superior shoulder.  Elbow feeling pretty good today.  Better with stretching it out.     Currently in Pain? Yes   Pain Score 0-No pain   Pain Location Shoulder   Pain Orientation Right                          [facility] Adult PT Treatment/Exercise - 05/11/16 0001    Shoulder Exercises: Seated   Other Seated Exercises UBE level 2 x7 min    Shoulder Exercises: Prone   Other Prone Exercises over green ball 2x 10 I Y T   Other Prone Exercises prone ball walkouts 5x   Shoulder Exercises: Standing   Horizontal ABduction Strengthening;Right;10 reps  2# plyo ball overhead 10x, diagonals right/left 10x each   Flexion Right;5 reps  hold 2# plyo ball at shoulder level scaption 3x 20 sec   Other Standing Exercises shoulder extension 30# 20x   Other Standing Exercises dynamic stepping with row with IR and ERotation   Manual Therapy   Joint Mobilization GH distraction and inferior grade 3 mobs with movement   Soft tissue mobilization left extensor carpi radialis brevis   Other Manual Therapy instrument assisted G4 and G2 left extensor carpis radialis and supinator                   PT Short Term Goals - 05/11/16 1939    PT SHORT TERM GOAL #1   Title The patient will have passive  right shoulder elevation to 125 degrees needed for preparation for AROM  when appropriate  with surgical healing 03/24/16   Status Achieved   PT SHORT TERM GOAL #2   Title Active or active-assisted right shoulder elevation to 110 degrees needed for dressing and low level reaching   Status Achieved   PT SHORT TERM GOAL #3   Title The patient will have ER to 45 degrees needed for driving   Status Achieved   PT SHORT TERM GOAL #4   Title The patient will report decreased pain by 25% for improved sleep   Status Achieved           PT Long Term Goals - 05/11/16 1939    PT LONG TERM GOAL #1   Title The patient will be independent in safe, self progression of HEP for further strengthening of right shoulder   06/13/16   Time 8   Period Weeks   Status On-going   PT LONG TERM GOAL #2   Title The patient will have 150 degrees of elevation overhead for reaching higher shelves at work  and home   Time 8   Period Weeks   Status On-going   PT LONG TERM GOAL #3   Title The patient will have shoulder and scapular muscle strength to grossly 4/5 needed for lifting light to medium objects at home and work.     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     Time 8   Period Weeks   Status On-going               Plan - 05/11/16 1931    Clinical Impression Statement The patient is improving with shoulder AROM, strength and overall pain reduction.  Decreasing tender points in left supinator and extensor carpi radialis brevis.  Patient reports fearfulness to use right UE and feels that is why she has overused tendons in left elbow.  Minimal upper trap compensatory shrug.  Muscle fatigue but no reports of shoulder pain.     PT Next Visit Plan 1 more ionto if needed;  DN left extensor wad as needed;  recheck shoulder AROM; continue strengthening      Patient will benefit from skilled therapeutic intervention in order to improve the following deficits and impairments:     Visit Diagnosis: Muscle weakness (generalized)  Pain in right shoulder  Stiffness of right shoulder, not elsewhere classified  Pain in left elbow     Problem List There are no active problems to display for this patient.  Ruben Im, PT 05/11/2016  7:44 PM Phone: [phone] Fax: [phone]  [name] 05/11/2016, 7:44 PM  [facility] [address]  [place], [place], [address] Phone: [phone]   Fax:  [phone]  Name: AKYRA BEAGAN MRN: [id number] Date of Birth: 1971-07-09

## 2016-05-16 ENCOUNTER — Ambulatory Visit: Payer: 59 | Admitting: Physical Therapy

## 2016-05-16 DIAGNOSIS — M25511 Pain in right shoulder: Secondary | ICD-10-CM

## 2016-05-16 DIAGNOSIS — M25522 Pain in left elbow: Secondary | ICD-10-CM | POA: Diagnosis not present

## 2016-05-16 DIAGNOSIS — M25611 Stiffness of right shoulder, not elsewhere classified: Secondary | ICD-10-CM | POA: Diagnosis not present

## 2016-05-16 DIAGNOSIS — M6281 Muscle weakness (generalized): Secondary | ICD-10-CM

## 2016-05-16 NOTE — Therapy (Signed)
[facility] [address], [place], [place], [address] Phone: [phone]   Fax:  [phone]  Physical Therapy Treatment  Patient Details  Name: [name] MRN: [id number] Date of Birth: 1970-12-30 Referring Provider: [name]  Encounter Date: 05/16/2016      PT End of Session - 05/16/16 1159    Visit Number 22   Number of Visits 30   Date for PT Re-Evaluation 06/13/16   Authorization Type [facility] UMR   PT Start Time 1015   PT Stop Time 1100   PT Time Calculation (min) 45 min   Activity Tolerance Patient tolerated treatment well      Past Medical History  Diagnosis Date  . GERD (gastroesophageal reflux disease)   . Migraines     [facility]  . History of kidney problems     1975, eval by 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 Depression     followed by [name]    Past Surgical History  Procedure Laterality Date  . Cesarean section  2007 & 2009  . Tubal ligation    . Cholecystectomy  2009  . Uterine ablation    . Cystoscopy  age 45  . Shoulder arthroscopy with subacromial decompression, rotator cuff repair and bicep tendon repair Right 02/11/2016    Procedure: RIGHT SHOULDER ARTHROSCOPY WITH SUBACROMIAL DECOMPRESSION, OPEN DCR,  OPEN MINI ROTATOR CUFF REPAIR, LABRAL REPAIR;  Surgeon: Netta Cedars, MD;  Location: [facility];  Service: Orthopedics;  Laterality: Right;    There were no vitals filed for this visit.      Subjective Assessment - 05/16/16 1016    Subjective Off work this past weekend.   Working next weekend.  Elbow feeling better.  Right shoulder still has some tightness.  Upper arm gets achy sometimes end of the day.  "It's work out sore."     Currently in Pain? No/denies   Pain Score 0-No  pain            [facility] PT Assessment - 05/16/16 0001    AROM   Right Shoulder Flexion 157 Degrees   Right Shoulder ABduction 165 Degrees   Right Shoulder Internal Rotation --  L1   Right Shoulder External Rotation 63 Degrees   Strength   Right Shoulder Flexion 4-/5   Right Shoulder Extension 4-/5   Right Shoulder ABduction 4-/5   Right Shoulder Internal Rotation 4-/5   Right Shoulder External Rotation 4-/5                     [facility] Adult PT Treatment/Exercise - 05/16/16 0001    Shoulder Exercises: Standing   Horizontal ABduction Strengthening;Right;10 reps  10# pulley D1 D2 Flexion 10x each   Internal Rotation Strengthening;Right;10 reps;Theraband  at 90 degrees   ABduction 10 reps  lateral raises 2# 10x   Other Standing Exercises dynamic hugs red band 10x   Other Standing Exercises Ws and Ts red band 10x each   Manual Therapy   Joint Mobilization GH distraction and inferior grade 3 mobs with movement   Soft tissue mobilization left extensor carpi radialis brevis          Trigger Point Dry Needling - 05/16/16 1053    Consent Given? Yes   Muscles Treated  Upper Body --  left extensor carpi radialis brevis   Upper Trapezius Response Twitch reponse elicited   Infraspinatus Response Palpable increased muscle length;Twitch response elicited                PT Short Term Goals - 05/16/16 1204    PT SHORT TERM GOAL #1   Title The patient will have passive  right shoulder elevation to 125 degrees needed for preparation for AROM  when appropriate with surgical healing 03/24/16   Status Achieved   PT SHORT TERM GOAL #2   Title Active or active-assisted right shoulder elevation to 110 degrees needed for dressing and low level reaching   Status Achieved   PT SHORT TERM GOAL #3   Title The patient will have ER to 45 degrees needed for driving   Status Achieved   PT SHORT TERM GOAL #4   Title The patient will report decreased pain by 25% for improved  sleep   Status Achieved           PT Long Term Goals - 05/16/16 1205    PT LONG TERM GOAL #1   Title The patient will be independent in safe, self progression of HEP for further strengthening of right shoulder   06/13/16   Time 8   Period Weeks   Status On-going   PT LONG TERM GOAL #2   Title The patient will have 150 degrees of elevation overhead for reaching higher shelves at work and home   Time 8   Period Weeks   Status On-going   PT LONG TERM GOAL #3   Title The patient will have shoulder and scapular muscle strength to grossly 4/5 needed for lifting light to medium objects at home and work.     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     Time 8   Period Weeks   Status On-going               Plan - 05/16/16 1159    Clinical Impression Statement The patient reports overall tightness and soreness in right shoulder.  She has a tender point in right upper trap decreased following dry needling.  Decreased tender points in left forearm extensor wad.  Excellent improvements in right shoulder AROM and improving strength without upper trap over compensation.   PT Next Visit Plan assess response to DN shoulder muscles, progress reps with fundamental shoulder exercises.        Patient will benefit from skilled therapeutic  intervention in order to improve the following deficits and impairments:     Visit Diagnosis: Muscle weakness (generalized)  Pain  in right shoulder  Stiffness of right shoulder, not elsewhere classified  Pain in left elbow     Problem List There are no active problems to display for this patient.   Ruben Im, PT 05/16/2016 12:07 PM Phone: [phone] Fax: [phone]  [name] 05/16/2016, 12:06 PM  [facility] [address] [place], [place], [address] Phone: [phone]   Fax:  [phone]  Name: [name] MRN: [id number] Date of Birth: 1971-04-19

## 2016-05-18 ENCOUNTER — Ambulatory Visit: Payer: 59 | Admitting: Physical Therapy

## 2016-05-18 DIAGNOSIS — M25511 Pain in right shoulder: Secondary | ICD-10-CM | POA: Diagnosis not present

## 2016-05-18 DIAGNOSIS — M25522 Pain in left elbow: Secondary | ICD-10-CM | POA: Diagnosis not present

## 2016-05-18 DIAGNOSIS — M6281 Muscle weakness (generalized): Secondary | ICD-10-CM | POA: Diagnosis not present

## 2016-05-18 DIAGNOSIS — M25611 Stiffness of right shoulder, not elsewhere classified: Secondary | ICD-10-CM

## 2016-05-18 NOTE — Therapy (Signed)
[facility] [address] [place], [place], [address] Phone: [phone]   Fax:  [phone]  Physical Therapy Treatment  Patient Details  Name: [name] MRN: [id number] Date of Birth: July 24, 1971 Referring Provider: [name]  Encounter Date: 05/18/2016      PT End of Session - 05/18/16 1040    Visit Number 23   Number of Visits 30   Date for PT Re-Evaluation 06/13/16   Authorization Type [facility] U[facility]   PT Start Time 1010   PT Stop Time 1059   PT Time Calculation (min) 49 min   Activity Tolerance Patient tolerated treatment well      Past Medical History  Diagnosis Date  . GERD (gastroesophageal reflux disease)   . Migraines     [facility]  . History of kidney problems     1975, eval by 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 Depression     followed by [name]    Past Surgical History  Procedure Laterality Date  . Cesarean section  2007 & 2009  . Tubal ligation    . Cholecystectomy  2009  . Uterine ablation    . Cystoscopy  age 78  . Shoulder arthroscopy with subacromial decompression, rotator cuff repair and bicep tendon repair Right 02/11/2016    Procedure: RIGHT SHOULDER ARTHROSCOPY WITH SUBACROMIAL DECOMPRESSION, OPEN DCR,  OPEN MINI ROTATOR CUFF REPAIR, LABRAL REPAIR;  Surgeon: Netta Cedars, MD;  Location: [facility];  Service: Orthopedics;  Laterality: Right;    There were no vitals filed for this visit.      Subjective Assessment - 05/18/16 1010    Subjective Just finished walking for exercise.  Reports a little soreness from last visit.  My elbow was really sore last night. I used the heating pad.     Currently in Pain? Yes   Pain Score 3    Pain Location Shoulder   Pain Frequency Intermittent    Pain Score 4   Pain Location Elbow   Pain Orientation Left   Pain Type Chronic pain   Pain Frequency Intermittent                         [facility] Adult PT Treatment/Exercise - 05/18/16 0001    Shoulder Exercises: Supine   Other Supine Exercises foam roll with red band overhead, horizontal abd, sash, ER 10x each   Shoulder Exercises: Seated   Other Seated Exercises UBE level 2 x7 min    Shoulder Exercises: Standing   Horizontal ABduction Right;15 reps;Theraband   Theraband Level (Shoulder Horizontal ABduction) Level 2 (Red)   Internal Rotation Strengthening;Right;Theraband;15 reps  at 90 degrees   Flexion Strengthening;Right;15 reps;Theraband   Theraband Level (Shoulder Flexion) Level 2 (Red)   Extension Both;15 reps;Theraband  horizontal   Other Standing Exercises 3 way raises 1# 2x 10 each'   Other Standing Exercises dynamic hugs 15x red band   Shoulder Exercises: Stretch   Other Shoulder Stretches pectoral stretch on foam roll 3 min   Manual Therapy   Joint Mobilization GH distraction and inferior grade 3 mobs with movement   Soft tissue mobilization left extensor carpi radialis brevis  PT Short Term Goals - 05/18/16 1041    PT SHORT TERM GOAL #1   Title The patient will have passive  right shoulder elevation to 125 degrees needed for preparation for AROM  when appropriate with surgical healing 03/24/16   Status Achieved   PT SHORT TERM GOAL #2   Title Active or active-assisted right shoulder elevation to 110 degrees needed for dressing and low level reaching   Status Achieved   PT SHORT TERM GOAL #3   Title The patient will have ER to 45 degrees needed for driving   Status Achieved   PT SHORT TERM GOAL #4   Title The patient will report decreased pain by 25% for improved sleep   Status Achieved           PT Long Term Goals - 05/18/16 1041    PT LONG TERM GOAL #1   Title The patient will be independent in safe, self  progression of HEP for further strengthening of right shoulder   06/13/16   Time 8   Period Weeks   Status On-going   PT LONG TERM GOAL #2   Title The patient will have 150 degrees of elevation overhead for reaching higher shelves at work and home   Time 8   Period Weeks   Status On-going   PT LONG TERM GOAL #3   Title The patient will have shoulder and scapular muscle strength to grossly 4/5 needed for lifting light to medium objects at home and work.     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     Time 8   Period Weeks   Status On-going               Plan - 05/18/16 1056    Clinical Impression Statement Progressing well with strengthening progression.   Needs verbal cues to decrease compensatory shoulder hike with elevation above 90 degrees.  Muscular fatigue post treatment session.  Therapist closely monitoring response with all interventions.     PT Next Visit Plan progress reps with fundamental shoulder exercises.  treatment as needed for left elbow,  joint mobs as needed.        Patient will benefit from skilled therapeutic intervention in order to improve the following deficits and impairments:     Visit Diagnosis: Muscle weakness (generalized)  Pain in right shoulder  Stiffness of right shoulder, not elsewhere  classified     Problem List There are no active problems to display for this patient.  Ruben Im, PT 05/18/2016 12:10 PM Phone: [phone] Fax: [phone]   [name] 05/18/2016, 12:09 PM  [facility] [address] [place], [place], [address] Phone: [phone]   Fax:  [phone]  Name: [name] MRN: [id number] Date of Birth: 1971-07-20

## 2016-05-23 ENCOUNTER — Ambulatory Visit: Payer: 59 | Admitting: Physical Therapy

## 2016-05-23 DIAGNOSIS — M25522 Pain in left elbow: Secondary | ICD-10-CM

## 2016-05-23 DIAGNOSIS — M25611 Stiffness of right shoulder, not elsewhere classified: Secondary | ICD-10-CM | POA: Diagnosis not present

## 2016-05-23 DIAGNOSIS — M25511 Pain in right shoulder: Secondary | ICD-10-CM

## 2016-05-23 DIAGNOSIS — M6281 Muscle weakness (generalized): Secondary | ICD-10-CM

## 2016-05-23 NOTE — Therapy (Signed)
[facility] [address], [place], [place], [address] Phone: [phone]   Fax:  [phone]  Physical Therapy Treatment  Patient Details  Name: [name] MRN: [id number] Date of Birth: Nov 02, 1971 Referring Provider: [name]  Encounter Date: 05/23/2016      PT End of Session - 05/23/16 1105    Visit Number 24   Number of Visits 30   Date for PT Re-Evaluation 06/13/16   Authorization Type [facility] UMR   PT Start Time 1015   PT Stop Time 1105   PT Time Calculation (min) 50 min   Activity Tolerance Patient tolerated treatment well      Past Medical History  Diagnosis Date  . GERD (gastroesophageal reflux disease)   . Migraines     [facility]  . History of kidney problems     1975, eval by 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 Depression     followed by [name]    Past Surgical History  Procedure Laterality Date  . Cesarean section  2007 & 2009  . Tubal ligation    . Cholecystectomy  2009  . Uterine ablation    . Cystoscopy  age 48  . Shoulder arthroscopy with subacromial decompression, rotator cuff repair and bicep tendon repair Right 02/11/2016    Procedure: RIGHT SHOULDER ARTHROSCOPY WITH SUBACROMIAL DECOMPRESSION, OPEN DCR,  OPEN MINI ROTATOR CUFF REPAIR, LABRAL REPAIR;  Surgeon: Netta Cedars, MD;  Location: [facility];  Service: Orthopedics;  Laterality: Right;    There were no vitals filed for this visit.      Subjective Assessment - 05/23/16 1016    Subjective My arm has been very sore.   A combination of everything.   I'm doing so much more with it.  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  I don't do much overhead but I push/pull the beds.  Wore elbow brace yesterday while  cleaning.     Currently in Pain? Yes   Pain Score 3    Pain Orientation Right   Pain Descriptors / Indicators Aching   Pain Type Surgical pain                         [facility] Adult PT Treatment/Exercise - 05/23/16 0001    Shoulder Exercises: Prone   Extension Strengthening;Right;20 reps;Weights   Extension Weight (lbs) 3   External Rotation Strengthening;Right;Weights;20 reps   External Rotation Weight (lbs) 2   Horizontal ABduction 1 Strengthening;Right;20 reps;Theraband   Horizontal ABduction 2 Strengthening;Right;20 reps   Horizontal ABduction 2 Weight (lbs) 2   Shoulder Exercises: Sidelying   External Rotation Strengthening;Right;20 reps;Weights   External Rotation Weight (lbs) 3   Shoulder Exercises: Standing   Horizontal ABduction Right;15 reps;Theraband   Theraband Level (Shoulder Horizontal ABduction) Level 2 (Red)   External Rotation Strengthening;Right;15 reps;Theraband   Theraband Level (Shoulder External Rotation) Level 2 (Red)   Internal Rotation Strengthening;Right;Theraband;15 reps   Theraband Level (Shoulder Internal Rotation) Level 2 (Red)   Flexion --  ball bouce off wall 20x;  overhead beach ball toss 20x   Other Standing Exercises green band diagonal extensions 20x with single leg standing   Other Standing Exercises red band diagonal flexion 10x   Shoulder  Exercises: ROM/Strengthening   UBE (Upper Arm Bike) 6 min (3/3)   Other ROM/Strengthening Exercises beach ball overhead toss 20x   Manual Therapy   Joint Mobilization GH distraction and inferior grade 3 mobs with movement   Other Manual Therapy PNF manually resisted D1/D2 12x                  PT Short Term Goals - 05/23/16 1907    PT SHORT TERM GOAL #1   Title The patient will have passive  right shoulder elevation to 125 degrees needed for preparation for AROM  when appropriate with surgical healing 03/24/16   Status Achieved   PT SHORT TERM GOAL #2   Title Active or  active-assisted right shoulder elevation to 110 degrees needed for dressing and low level reaching   Status Achieved   PT SHORT TERM GOAL #3   Title The patient will have ER to 45 degrees needed for driving   Status Achieved   PT SHORT TERM GOAL #4   Title The patient will report decreased pain by 25% for improved sleep   Status Achieved           PT Long Term Goals - 05/23/16 1907    PT LONG TERM GOAL #1   Title The patient will be independent in safe, self progression of HEP for further strengthening of right shoulder   06/13/16   Time 8   Period Weeks   Status On-going   PT LONG TERM GOAL #2   Title The patient will have 150 degrees of elevation overhead for reaching higher shelves at work and home   Time 8   Period Weeks   Status On-going   PT LONG TERM GOAL #3   Title The patient will have shoulder and scapular muscle strength to grossly 4/5 needed for lifting light to medium objects at home and work.     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     Time 8   Period Weeks   Status On-going               Plan - 05/23/16 1900    Clinical Impression Statement Patient is progressing well with glenohumeral and scapular strengthening progression.  Minimal verbal cues needed to decrease compensatory shoulder shrug.  No  complaints of left elbow pain during treatment session.  Minimal post ex soreness produced, patient declines the need for modalities.     PT Next Visit Plan progress reps with fundamental shoulder exercises.  treatment as needed for left elbow,  joint mobs as needed;  recheck AROM and MMT  Patient will benefit from skilled therapeutic intervention in order to improve the following deficits and impairments:     Visit Diagnosis: Muscle weakness (generalized)  Pain in right shoulder  Stiffness of right shoulder, not elsewhere classified  Pain in left elbow     Problem List There are no active problems to display for this patient.   Ruben Im, PT 05/23/2016 7:08 PM Phone: [phone] Fax: [phone] [name] 05/23/2016, 7:08 PM  [facility] [address], [place], [place], [address] Phone: [phone]   Fax:  [phone]  Name: TITIA ODAM MRN: [id number] Date of Birth: 01/10/1971

## 2016-05-25 ENCOUNTER — Ambulatory Visit: Payer: 59 | Admitting: Physical Therapy

## 2016-05-25 DIAGNOSIS — M25522 Pain in left elbow: Secondary | ICD-10-CM | POA: Diagnosis not present

## 2016-05-25 DIAGNOSIS — M25611 Stiffness of right shoulder, not elsewhere classified: Secondary | ICD-10-CM | POA: Diagnosis not present

## 2016-05-25 DIAGNOSIS — M6281 Muscle weakness (generalized): Secondary | ICD-10-CM | POA: Diagnosis not present

## 2016-05-25 DIAGNOSIS — M25511 Pain in right shoulder: Secondary | ICD-10-CM

## 2016-05-25 NOTE — Therapy (Signed)
[facility] [address] [place], [place], [address] Phone: [phone]   Fax:  [phone]  Physical Therapy Treatment  Patient Details  Name: [name] MRN: [id number] Date of Birth: 26-Sep-1971 Referring Provider: [name]  Encounter Date: 05/25/2016      PT End of Session - 05/25/16 1100    Visit Number 25   Number of Visits 30   Date for PT Re-Evaluation 06/13/16   Authorization Type [facility] UMR   PT Start Time 1021   PT Stop Time 1102   PT Time Calculation (min) 41 min   Activity Tolerance Patient tolerated treatment well      Past Medical History  Diagnosis Date  . GERD (gastroesophageal reflux disease)   . Migraines     [facility]  . History of kidney problems     1975, eval by 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 Depression     followed by [name]    Past Surgical History  Procedure Laterality Date  . Cesarean section  2007 & 2009  . Tubal ligation    . Cholecystectomy  2009  . Uterine ablation    . Cystoscopy  age 59  . Shoulder arthroscopy with subacromial decompression, rotator cuff repair and bicep tendon repair Right 02/11/2016    Procedure: RIGHT SHOULDER ARTHROSCOPY WITH SUBACROMIAL DECOMPRESSION, OPEN DCR,  OPEN MINI ROTATOR CUFF REPAIR, LABRAL REPAIR;  Surgeon: Netta Cedars, MD;  Location: [facility];  Service: Orthopedics;  Laterality: Right;    There were no vitals filed for this visit.      Subjective Assessment - 05/25/16 1029    Subjective No complaints. I've embraced the soreness.  It's like when I used to go to [place] PT Assessment - 05/25/16 0001    AROM   Right Shoulder Flexion 165 Degrees   Right Shoulder ABduction 172 Degrees   Right  Shoulder Internal Rotation --  behind back to L1   Right Shoulder External Rotation 70 Degrees   Strength   Right Shoulder Flexion 4/5   Right Shoulder Extension 4/5   Right Shoulder ABduction 4-/5   Right Shoulder Internal Rotation 4/5   Right Shoulder External Rotation 4/5                     [facility] Adult PT Treatment/Exercise - 05/25/16 0001    Shoulder Exercises: Standing   Horizontal ABduction Strengthening;Both;20 reps;Theraband   Theraband Level (Shoulder Horizontal ABduction) Level 2 (Red)   Flexion Strengthening;Both;20 reps;Theraband  Ws   ABduction Theraband;20 reps   Theraband Level (Shoulder ABduction) Level 2 (Red)   Other Standing Exercises dynamic hugs red band 20x   Other Standing Exercises red band bicep curls 20x   Shoulder Exercises: ROM/Strengthening   UBE (Upper Arm Bike) 6 min (3/3)   Shoulder Exercises: Stretch   Other Shoulder Stretches sidelying sleeper stretch low load 3 min   Shoulder Exercises: Power Warden/ranger Exercises seated rows 25# 20x   Manual Therapy   Joint Mobilization GH distraction and inferior grade 3 mobs with movement  grade 4 distraction/inferior   Other Manual Therapy PROM with overpressure all planes  PT Short Term Goals - 05/25/16 1438    PT SHORT TERM GOAL #1   Title The patient will have passive  right shoulder elevation to 125 degrees needed for preparation for AROM  when appropriate with surgical healing 03/24/16   Status Achieved   PT SHORT TERM GOAL #2   Title Active or active-assisted right shoulder elevation to 110 degrees needed for dressing and low level reaching   Status Achieved   PT SHORT TERM GOAL #3   Title The patient will have ER to 45 degrees needed for driving   Status Achieved   PT SHORT TERM GOAL #4   Title The patient will report decreased pain by 25% for improved sleep   Status Achieved           PT Long Term Goals - 05/25/16 1438    PT LONG  TERM GOAL #1   Title The patient will be independent in safe, self progression of HEP for further strengthening of right shoulder   06/13/16   Time 8   Period Weeks   Status On-going   PT LONG TERM GOAL #2   Title The patient will have 150 degrees of elevation overhead for reaching higher shelves at work and home   Time 8   Period Weeks   Status On-going   PT LONG TERM GOAL #3   Title The patient will have shoulder and scapular muscle strength to grossly 4/5 needed for lifting light to medium objects at home and work.     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     Time 8   Period Weeks   Status On-going               Plan - 05/25/16 1431    Clinical Impression Statement The patient is progressing well with shoulder AROM and strength.  Most difficult motion is internal rotation.  MInimal compensatory shoulder hike with overhead motions.  Patient is using her UE for work and home ADLS but still avoiding overhead reaching and lifting.  Her left elbow pain is improving with treatment and decreased discomfort now that right UE spontaneous use has improved.  Therapist closely monitoring response with all interventions and cuing on technique and pain.     PT Next Visit Plan progress reps with fundamental shoulder exercises.  treatment as needed  for left elbow,  joint mobs as needed;  work on IR ROM;  patient out of town next week;  patient to see MD before next PT appt      Patient will benefit from skilled therapeutic intervention in order to improve the following  deficits and impairments:     Visit Diagnosis: Muscle weakness (generalized)  Pain in right shoulder  Stiffness of right shoulder, not elsewhere classified  Pain in left elbow     Problem List There are no active problems to display for this patient.   Ruben Im, PT 05/25/2016 2:40 PM Phone: [phone] Fax: [phone]  [name] 05/25/2016, 2:40 PM  [facility] [address], [place], [place], [address] Phone: [phone]   Fax:  [phone]  Name: [name] MRN: [id number] Date of Birth: 03/20/1971

## 2016-06-06 ENCOUNTER — Ambulatory Visit: Payer: 59 | Attending: Orthopedic Surgery | Admitting: Physical Therapy

## 2016-06-06 DIAGNOSIS — M75111 Incomplete rotator cuff tear or rupture of right shoulder, not specified as traumatic: Secondary | ICD-10-CM | POA: Diagnosis not present

## 2016-06-06 DIAGNOSIS — M6281 Muscle weakness (generalized): Secondary | ICD-10-CM | POA: Insufficient documentation

## 2016-06-06 DIAGNOSIS — M25611 Stiffness of right shoulder, not elsewhere classified: Secondary | ICD-10-CM | POA: Insufficient documentation

## 2016-06-06 DIAGNOSIS — M25511 Pain in right shoulder: Secondary | ICD-10-CM | POA: Insufficient documentation

## 2016-06-06 DIAGNOSIS — M19011 Primary osteoarthritis, right shoulder: Secondary | ICD-10-CM | POA: Diagnosis not present

## 2016-06-06 DIAGNOSIS — Z4789 Encounter for other orthopedic aftercare: Secondary | ICD-10-CM | POA: Diagnosis not present

## 2016-06-06 DIAGNOSIS — M25522 Pain in left elbow: Secondary | ICD-10-CM | POA: Diagnosis not present

## 2016-06-06 NOTE — Therapy (Signed)
[facility] [address] [place], [place], [address] Phone: [phone]   Fax:  [phone]  Physical Therapy Treatment/Discharge Summary  Patient Details  Name: RANDY WHITENER MRN: [id number] Date of Birth: [birth year] Referring Provider: [name]  Encounter Date: 06/06/2016      PT End of Session - 06/06/16 1420    Visit Number 26   Number of Visits 30   Date for PT Re-Evaluation 06/13/16   Authorization Type [facility]   PT Start Time 1150   PT Stop Time 1230   PT Time Calculation (min) 40 min   Activity Tolerance Patient tolerated treatment well      Past Medical History  Diagnosis Date  . GERD (gastroesophageal reflux disease)   . Migraines     [facility]  . History of kidney problems     1975, eval by 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 Depression     followed by [name]    Past Surgical History  Procedure Laterality Date  . Cesarean section  2007 & 2009  . Tubal ligation    . Cholecystectomy  2009  . Uterine ablation    . Cystoscopy  age 45  . Shoulder arthroscopy with subacromial decompression, rotator cuff repair and bicep tendon repair Right 02/11/2016    Procedure: RIGHT SHOULDER ARTHROSCOPY WITH SUBACROMIAL DECOMPRESSION, OPEN DCR,  OPEN MINI ROTATOR CUFF REPAIR, LABRAL REPAIR;  Surgeon: Netta Cedars, MD;  Location: [place];  Service: Orthopedics;  Laterality: Right;    There were no vitals filed for this visit.      Subjective Assessment - 06/06/16 1149    Subjective I saw [name] this morning and he thought I was doing really well and he said not to force the behind the back, it may be another 2 months before I get it.  He OK'd to return to [employer] in August.  My elbow comes and  goes.  I haven't noticed it lately.  Wearing elbow brace more.  I did 25 push ups and planks while on vacation at the beach.     Currently in Pain? No/denies   Pain Score 0-No pain   Pain Location Shoulder   Pain Orientation Right            [facility] PT Assessment - 06/06/16 0001    Observation/Other Assessments   Focus on Therapeutic Outcomes (FOTO)  33% limitation shoulder;  33% elbow   AROM   Right Shoulder Flexion 170 Degrees   Right Shoulder ABduction 170 Degrees   Right Shoulder Internal Rotation --  behind back L1   Right Shoulder External Rotation 72 Degrees   Strength   Right Shoulder Flexion 4/5   Right Shoulder Extension 4/5   Right Shoulder ABduction 4/5   Right Shoulder Internal Rotation 4/5   Right Shoulder External Rotation 4/5   Right/Left hand Left   Left Hand Grip (lbs) 30#   2/10 pain with bent arm, 3/10 with straight arm                     [facility] Adult PT Treatment/Exercise - 06/06/16 0001    Shoulder Exercises: Prone   Extension Strengthening;Right;20 reps;Weights   Extension Weight (lbs) 3   External Rotation Strengthening;Right;Weights;20  reps   External Rotation Weight (lbs) 3   Horizontal ABduction 1 Strengthening;Right;20 reps;Theraband   Horizontal ABduction 1 Weight (lbs) 3   Horizontal ABduction 2 Strengthening;Right;20 reps   Horizontal ABduction 2 Weight (lbs) 3   Shoulder Exercises: Sidelying   External Rotation Strengthening;Right;20 reps;Weights   External Rotation Weight (lbs) 3   Shoulder Exercises: Standing   Other Standing Exercises 2# flexion 10x   Manual Therapy   Joint Mobilization GH distraction and inferior grade 3 mobs with movement  grade 4 distraction/inferior   Other Manual Therapy PROM with overpressure all planes                PT Education - 06/06/16 1419    Education provided Yes   Education Details Discussion of safe self progression of HEP of right shoulder and left elbow   Person(s)  Educated Patient   Methods Explanation   Comprehension Verbalized understanding          PT Short Term Goals - 06/06/16 1229    PT SHORT TERM GOAL #1   Title The patient will have passive  right shoulder elevation to 125 degrees needed for preparation for AROM  when appropriate with surgical healing 03/24/16   Status Achieved   PT SHORT TERM GOAL #2   Title Active or active-assisted right shoulder elevation to 110 degrees needed for dressing and low level reaching   Status Achieved   PT SHORT TERM GOAL #3   Title The patient will have ER to 45 degrees needed for driving   Status Achieved   PT SHORT TERM GOAL #4   Title The patient will report decreased pain by 25% for improved sleep   Status Achieved           PT Long Term Goals - 06/06/16 1228    PT LONG TERM GOAL #1   Title The patient will be independent in safe, self progression of HEP for further strengthening of right shoulder   06/13/16   Status Achieved   PT LONG TERM GOAL #2   Title The patient will have 150 degrees of elevation overhead for reaching higher shelves at work and home   Status Achieved   PT LONG TERM GOAL #3   Title The patient will have shoulder and scapular muscle strength to grossly 4/5 needed for lifting light to medium objects at home and work.     Status Achieved   PT LONG TERM GOAL #4   Title The patient will have improved ER to 70 degrees needed for taking care of her children and work duties as a Engineer, civil (consulting)   Status Achieved   PT LONG TERM GOAL #5   Title Overall pain level < or = to 3/10 with home and work ADLS   Status Achieved   PT LONG TERM GOAL #6   Title FOTO functional outcome score for shoulder improved from 66% limitation to 33% indicating improved function with less pain   Status Achieved   PT LONG TERM GOAL #7   Title Patient will have 50% reduction in left elbow pain with gripping and lifting coffee pot or pitcher.     Status Achieved               Plan - 06/06/16 1420     Clinical Impression Statement The patient has made excellent progress with  right shoulder AROM and glenohumeral/scapular strength s/p rotator cuff repair for near full thickness tear.  She has minimal compensatory shoulder hike.  Her  left elbow pain is improving now that she is using her right UE more often.  Her AROM is nearly full in shoulder and strength grossly 4/5.  Her FOTO functional outcome score has improved from 66% limitation to 33%.  She has been very compliant with her HEP and we have discuseed a safe,self progression of her program over time.  She expresses a good understanding.  Discharge from PT with all rehab goals met.       PHYSICAL THERAPY DISCHARGE SUMMARY  Visits from Start of Care: 26  Current functional level related to goals / functional outcomes: See clinical impressions above   Remaining deficits: See above.  All goals met.   Education / Equipment: Progressive HEP Plan: Patient agrees to discharge.  Patient goals were met. Patient is being discharged due to meeting the stated rehab goals.  ?????        Patient will benefit from skilled therapeutic intervention in order to improve the following deficits and impairments:     Visit Diagnosis: Muscle weakness (generalized)  Pain in right shoulder  Stiffness of right shoulder, not elsewhere classified  Pain in left elbow     Problem List There are no active problems to display for this patient.   Ruben Im, PT 06/06/2016 2:27 PM Phone: [phone] Fax: [phone]  [name] 06/06/2016, 2:26 PM  [facility] [address], [place], [place], [address] Phone: [phone]   Fax:  [phone]  Name: KAELI NICHELSON MRN: [id number] Date of Birth: December 17, 1970

## 2016-06-08 ENCOUNTER — Ambulatory Visit: Payer: 59 | Admitting: Physical Therapy

## 2016-07-03 ENCOUNTER — Telehealth: Payer: 59 | Admitting: Nurse Practitioner

## 2016-07-03 DIAGNOSIS — N390 Urinary tract infection, site not specified: Secondary | ICD-10-CM | POA: Diagnosis not present

## 2016-07-03 DIAGNOSIS — N3001 Acute cystitis with hematuria: Secondary | ICD-10-CM | POA: Diagnosis not present

## 2016-07-03 MED ORDER — SULFAMETHOXAZOLE-TRIMETHOPRIM 800-160 MG PO TABS: 1.0000 | ORAL_TABLET | Freq: Two times a day (BID) | ORAL | 0 refills | Status: AC

## 2016-07-03 NOTE — Progress Notes (Signed)

## 2016-07-17 ENCOUNTER — Ambulatory Visit: Payer: 59 | Admitting: Dietician

## 2016-11-06 DIAGNOSIS — D1801 Hemangioma of skin and subcutaneous tissue: Secondary | ICD-10-CM | POA: Diagnosis not present

## 2016-11-06 DIAGNOSIS — D2261 Melanocytic nevi of right upper limb, including shoulder: Secondary | ICD-10-CM | POA: Diagnosis not present

## 2016-11-06 DIAGNOSIS — D2262 Melanocytic nevi of left upper limb, including shoulder: Secondary | ICD-10-CM | POA: Diagnosis not present

## 2016-11-06 DIAGNOSIS — D225 Melanocytic nevi of trunk: Secondary | ICD-10-CM | POA: Diagnosis not present

## 2016-11-06 DIAGNOSIS — D2371 Other benign neoplasm of skin of right lower limb, including hip: Secondary | ICD-10-CM | POA: Diagnosis not present

## 2016-11-06 DIAGNOSIS — L821 Other seborrheic keratosis: Secondary | ICD-10-CM | POA: Diagnosis not present

## 2016-11-06 DIAGNOSIS — D2272 Melanocytic nevi of left lower limb, including hip: Secondary | ICD-10-CM | POA: Diagnosis not present

## 2016-12-06 DIAGNOSIS — Z1151 Encounter for screening for human papillomavirus (HPV): Secondary | ICD-10-CM | POA: Diagnosis not present

## 2016-12-06 DIAGNOSIS — Z01419 Encounter for gynecological examination (general) (routine) without abnormal findings: Secondary | ICD-10-CM | POA: Diagnosis not present

## 2016-12-06 DIAGNOSIS — Z1231 Encounter for screening mammogram for malignant neoplasm of breast: Secondary | ICD-10-CM | POA: Diagnosis not present

## 2016-12-06 DIAGNOSIS — Z6835 Body mass index (BMI) 35.0-35.9, adult: Secondary | ICD-10-CM | POA: Diagnosis not present

## 2016-12-07 DIAGNOSIS — Z13220 Encounter for screening for lipoid disorders: Secondary | ICD-10-CM | POA: Diagnosis not present

## 2016-12-07 DIAGNOSIS — Z130 Encounter for screening for diseases of the blood and blood-forming organs and certain disorders involving the immune mechanism: Secondary | ICD-10-CM | POA: Diagnosis not present

## 2016-12-07 DIAGNOSIS — Z0000 Encounter for general adult medical examination without abnormal findings: Secondary | ICD-10-CM | POA: Diagnosis not present

## 2016-12-07 DIAGNOSIS — Z131 Encounter for screening for diabetes mellitus: Secondary | ICD-10-CM | POA: Diagnosis not present

## 2016-12-07 DIAGNOSIS — Z1329 Encounter for screening for other suspected endocrine disorder: Secondary | ICD-10-CM | POA: Diagnosis not present

## 2016-12-07 DIAGNOSIS — Z1321 Encounter for screening for nutritional disorder: Secondary | ICD-10-CM | POA: Diagnosis not present

## 2017-01-24 ENCOUNTER — Emergency Department
Admission: EM | Admit: 2017-01-24 | Discharge: 2017-01-24 | Disposition: A | Discharge status: Home / Self Care | Payer: 59 | Source: Home / Self Care | Attending: Family Medicine | Admitting: Family Medicine

## 2017-01-24 ENCOUNTER — Encounter: Payer: Self-pay | Admitting: *Deleted

## 2017-01-24 DIAGNOSIS — R1013 Epigastric pain: Secondary | ICD-10-CM

## 2017-01-24 DIAGNOSIS — Z8719 Personal history of other diseases of the digestive system: Secondary | ICD-10-CM

## 2017-01-24 MED ORDER — OMEPRAZOLE 20 MG PO CPDR: 20.0000 mg | DELAYED_RELEASE_CAPSULE | Freq: Every day | ORAL | 0 refills | Status: DC

## 2017-01-24 MED FILL — OMEPRAZOLE 20 MG CAPSULE DR: 20 | 30 days supply | Qty: 30 | Fill #0

## 2017-01-24 NOTE — ED Triage Notes (Signed)
Pt c/o epigastric pain, indigestion, and burping x 2 months. Denies any recent diet changes, nausea, vomiting or diarrhea. She has taken Tums with minimal relief.

## 2017-01-24 NOTE — ED Provider Notes (Signed)
CSN: [id number]     Arrival date & time 01/24/17  1018 History   First MD Initiated Contact with Patient 01/24/17 1045     Chief Complaint  Patient presents with  . Abdominal Pain   (Consider location/radiation/quality/duration/timing/severity/associated sxs/prior Treatment) HPI  [name] is a 46 y.o. female presenting to UC with c/o 2 months of epigastric pain that is a burning and bubbling sensation with intermittent indigestion and burping.  Pain and symptoms are worse at night.  She sometimes feels a "bubbling" feeling in her abdomen.  Denies recent diet changes, nausea, vomiting or diarrhea. She did have her gall bladder removed a few years ago and was advised at that time she was showing some signs of acid reflux but was never placed on prescription medication.  More recently, she has tried OTC Tums with mild temporary relief but symptoms are becoming more frequent and severe. She called her GI specialist but cannot f/u for 4-6 weeks and they were requesting a referral.  Pt does see her GYN as her primary care provider but does not have a specific Family Medicine provider.  Denies fever, chills. No hx of pancreatitis. Denies chest pain or SOB.    Past Medical History:  Diagnosis Date  . Depression    followed by [name]  . GERD (gastroesophageal reflux disease)   . History of kidney problems    1975, eval by 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 Migraines    [facility]   Past Surgical History:  Procedure Laterality Date  . CESAREAN SECTION  2007 & 2009  . CHOLECYSTECTOMY  2009  . CYSTOSCOPY  age 58  . SHOULDER ARTHROSCOPY WITH SUBACROMIAL DECOMPRESSION, ROTATOR CUFF REPAIR AND BICEP TENDON REPAIR Right 02/11/2016   Procedure: RIGHT SHOULDER ARTHROSCOPY WITH SUBACROMIAL  DECOMPRESSION, OPEN DCR,  OPEN MINI ROTATOR CUFF REPAIR, LABRAL REPAIR;  Surgeon: Netta Cedars, MD;  Location: [place];  Service: Orthopedics;  Laterality: Right;  . TUBAL LIGATION    . uterine ablation     Family History  Problem Relation Age of Onset  . Hyperlipidemia Mother   . Depression Mother   . Migraines Father   . Hypertension Father   . Hyperlipidemia Father   . Bipolar disorder Sister   . Diabetes Paternal Grandfather   . Heart disease Paternal Grandfather    Social History  Substance Use Topics  . Smoking status: Former Research scientist (life sciences)  . Smokeless tobacco: Never Used  . Alcohol use Yes     Comment: rare   OB History    No data available     Review of Systems  Constitutional: Negative for chills and fever.  HENT: Negative for congestion, ear pain, sore throat, trouble swallowing and voice change.   Respiratory: Negative for cough and shortness of breath.   Cardiovascular: Negative for chest pain and palpitations.  Gastrointestinal: Positive for abdominal pain ( epigastric). Negative for diarrhea, nausea and vomiting.  Musculoskeletal: Negative for arthralgias, back pain and myalgias.  Skin: Negative for rash.    Allergies  Patient has no known allergies.  Home Medications   Prior to Admission medications   Medication Sig Start Date End Date Taking? Authorizing Provider  omeprazole (PRILOSEC) 20 MG capsule Take 1 capsule (20 mg total) by mouth daily. 01/24/17   Junie Panning  Hilda Blades, PA-C   Meds Ordered and Administered this Visit  Medications - No data to display  BP 113/70 (BP Location: Left Arm)   Pulse 83   Temp 98.2 F (36.8 C) (Oral)   Resp 16   Ht 5\' 3"  (1.6 m)   Wt 202 lb (91.6 kg)   LMP 01/05/2017   SpO2 100%   BMI 35.78 kg/m  No data found.   Physical Exam  Constitutional: She is oriented to person, place, and time. She appears well-developed and well-nourished. No distress.  HENT:  Head: Normocephalic and atraumatic.  Mouth/Throat: Oropharynx is  clear and moist.  Eyes: EOM are normal.  Neck: Normal range of motion. Neck supple.  Cardiovascular: Normal rate and regular rhythm.   Pulmonary/Chest: Effort normal. No stridor. No respiratory distress. She has no wheezes.  Abdominal: Soft. She exhibits no distension and no mass. There is no tenderness. There is no rebound, no guarding and no CVA tenderness. No hernia.  Musculoskeletal: Normal range of motion.  Lymphadenopathy:    She has no cervical adenopathy.  Neurological: She is alert and oriented to person, place, and time.  Skin: Skin is warm and dry. She is not diaphoretic.  Psychiatric: She has a normal mood and affect. Her behavior is normal.  Nursing note and vitals reviewed.   Urgent Care Course     Procedures (including critical care time)  Labs Review Labs Reviewed - No data to display  Imaging Review No results found.   MDM   1. Epigastric pain   2. History of gastroesophageal reflux (GERD)    Pt c/o epigastric pain for 2 months with intermittent indigestion, belching bubbling sensation. Vitals: WNL Abdomen is soft, non-tender. No masses palpated. Symptoms likely due to GERD  Rx: omeprazole Encouraged f/u with PCP and GI for recheck of symptoms if not improving as she may need additional testing and/or different medication/dose.    Noland Fordyce, PA-C 01/24/17 1200

## 2017-02-12 DIAGNOSIS — H52223 Regular astigmatism, bilateral: Secondary | ICD-10-CM | POA: Diagnosis not present

## 2017-02-12 DIAGNOSIS — H5201 Hypermetropia, right eye: Secondary | ICD-10-CM | POA: Diagnosis not present

## 2017-03-08 ENCOUNTER — Other Ambulatory Visit (HOSPITAL_COMMUNITY): Payer: Self-pay | Admitting: Gastroenterology

## 2017-03-08 ENCOUNTER — Other Ambulatory Visit: Payer: Self-pay | Admitting: Gastroenterology

## 2017-03-08 DIAGNOSIS — R1013 Epigastric pain: Secondary | ICD-10-CM

## 2017-03-08 DIAGNOSIS — R12 Heartburn: Secondary | ICD-10-CM

## 2017-03-13 ENCOUNTER — Ambulatory Visit
Admission: RE | Admit: 2017-03-13 | Discharge: 2017-03-13 | Disposition: A | Discharge status: Home / Self Care | Payer: 59 | Source: Ambulatory Visit | Attending: Gastroenterology | Admitting: Gastroenterology

## 2017-03-13 DIAGNOSIS — K219 Gastro-esophageal reflux disease without esophagitis: Secondary | ICD-10-CM | POA: Diagnosis not present

## 2017-03-13 DIAGNOSIS — R12 Heartburn: Secondary | ICD-10-CM

## 2017-03-13 DIAGNOSIS — R1013 Epigastric pain: Secondary | ICD-10-CM

## 2017-05-23 DIAGNOSIS — K219 Gastro-esophageal reflux disease without esophagitis: Secondary | ICD-10-CM | POA: Diagnosis not present

## 2017-05-23 DIAGNOSIS — R002 Palpitations: Secondary | ICD-10-CM | POA: Diagnosis not present

## 2017-06-19 ENCOUNTER — Encounter: Payer: Self-pay | Admitting: Cardiology

## 2017-06-19 ENCOUNTER — Ambulatory Visit (INDEPENDENT_AMBULATORY_CARE_PROVIDER_SITE_OTHER): Payer: 59 | Admitting: Cardiology

## 2017-06-19 VITALS — BP 102/78 | HR 77 | Ht 63.0 in | Wt 195.6 lb

## 2017-06-19 DIAGNOSIS — R0789 Other chest pain: Secondary | ICD-10-CM | POA: Diagnosis not present

## 2017-06-19 DIAGNOSIS — R002 Palpitations: Secondary | ICD-10-CM

## 2017-06-19 NOTE — Patient Instructions (Signed)
Medication Instructions:  Your physician recommends that you continue on your current medications as directed. Please refer to the Current Medication list given to you today.   Labwork: none  Testing/Procedures: Your physician has requested that you have an exercise tolerance test. For further information please visit [email or web address]. Please also follow instruction sheet, as given.     Follow-Up: As needed.       If you need a refill on your cardiac medications before your next appointment, please call your pharmacy.

## 2017-06-19 NOTE — Progress Notes (Signed)
Cardiology Office Note:    Date:  06/19/2017   ID:  [name], DOB 10-10-1971, MRN [id number]  PCP:  Servando Salina, MD  Cardiologist:  Candee Furbish, MD    Referring MD: Servando Salina, MD     History of Present Illness:    [name] is a 46 y.o. female with a hx of GERD here for evaluation of chest pain and palpitations at the request of Dr.Cousins.  In review of prior noted she's had chest pain and palpitations over the past 2 years with increase fluttering in the last several weeks. After her cholecystectomy she felt chronic chest discomfort. In the past it is been evaluated and was determined to be indigestion or gas according to prior note. 9 years ago. Epigastric discomfort was noted by GI, EGD showed GERD. Palpitations can occur multiple times a day. 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 She had printed rhythm strips. No dizziness, no shortness of breath. Noted it more with resting.  Feels pulsitile abd.     Past Medical History:  Diagnosis Date  . Depression    followed by [name]  . GERD (gastroesophageal reflux disease)   . History of kidney problems    1975, eval by 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 Migraines    [facility]    Past Surgical History:  Procedure Laterality Date  . CESAREAN SECTION  2007 & 2009  . CHOLECYSTECTOMY  2009  . CYSTOSCOPY  age 80  . SHOULDER ARTHROSCOPY WITH SUBACROMIAL DECOMPRESSION, ROTATOR CUFF REPAIR AND BICEP TENDON REPAIR Right 02/11/2016   Procedure: RIGHT SHOULDER ARTHROSCOPY WITH SUBACROMIAL DECOMPRESSION, OPEN DCR,  OPEN MINI ROTATOR CUFF REPAIR, LABRAL REPAIR;  Surgeon: Netta Cedars, MD;  Location: [place];  Service: Orthopedics;  Laterality: Right;    . TUBAL LIGATION    . uterine ablation      Current Medications: No outpatient prescriptions have been marked as taking for the 06/19/17 encounter (Office Visit) with Jerline Pain, MD.     Allergies:   Patient has no known allergies.   Social History   Social History  . Marital status: Married    Spouse name: N/A  . Number of children: N/A  . Years of education: N/A   Social History Main Topics  . Smoking status: Former Research scientist (life sciences)  . Smokeless tobacco: Never Used  . Alcohol use Yes     Comment: rare  . Drug use: No  . Sexual activity: Not Asked   Other Topics Concern  . None   Social History Narrative  . None     Family History: The patient's family history includes Bipolar disorder in her sister; Depression in her mother; Diabetes in her paternal grandfather; Heart disease in her paternal grandfather; Hyperlipidemia in her father and mother; Hypertension in her father; Migraines in her father. no early CAD. SVT once. GM - afib 71.  ROS:   Please see the history of present illness.    Denies any syncope, bleeding, orthopnea, PND All other systems reviewed and are negative.  EKGs/Labs/Other Studies Reviewed:    The following studies were reviewed today: Chol good  EKG:  EKG is  ordered today.  The ekg ordered today demonstrates  06/19/17-sinus rhythm, nonspecific T-wave changes personally viewed heart rate 77 bpm  Recent Labs: No results found for requested labs within last 8760 hours.  Recent Lipid Panel No results found for: CHOL, TRIG, HDL, CHOLHDL, VLDL, LDLCALC, LDLDIRECT  Physical Exam:    VS:  BP 102/78   Pulse 77   Ht 5\' 3"  (1.6 m)   Wt 195 lb 9.6 oz (88.7 kg)   SpO2 97%   BMI 34.65 kg/m     Wt Readings from Last 3 Encounters:  06/19/17 195 lb 9.6 oz (88.7 kg)  01/24/17 202 lb (91.6 kg)  03/15/16 190 lb 4.8 oz (86.3 kg)     GEN:  Well nourished, well developed in no acute distress HEENT: Normal NECK: No JVD; No carotid bruits LYMPHATICS: No  lymphadenopathy CARDIAC: RRR, no murmurs, rubs, gallops RESPIRATORY:  Clear to auscultation without rales, wheezing or rhonchi  ABDOMEN: Soft, non-tender, non-distended MUSCULOSKELETAL:  No edema; No deformity  SKIN: Warm and dry NEUROLOGIC:  Alert and oriented x 3 PSYCHIATRIC:  Normal affect   ASSESSMENT:    1. Atypical chest pain   2. Palpitation    PLAN:    In order of problems listed above:  Chest pain  - Chronic, atypical, has noted this since cholecystectomy. Does not sound cardiac in origin.  - For piece of mind we will check an exercise treadmill test. She is still desiring to undergo heavy physical activity.   palpitations   - she herself was able to capture PVCs approximate 5/min.   - Described physiology behind this, benign. Described that PVCs in the 15,000 range per day could possibly cause cardiomyopathy. She is been able to exercise well without any significant shortness of breath.  - These could be electrolyte related, continue to maintain hydration, magnesium, potassium, salt.    she will let us know if any further difficulties develop.  Medication Adjustments/Labs and Tests Ordered: Current medicines are reviewed at length with the patient today.  Concerns regarding medicines are outlined above.  Orders Placed This Encounter  Procedures  . Exercise Tolerance Test  . EKG 12-Lead   No orders of the defined types were placed in this encounter.   Signed, Candee Furbish, MD  06/19/2017 3:34 PM    [place]

## 2017-07-04 ENCOUNTER — Ambulatory Visit (INDEPENDENT_AMBULATORY_CARE_PROVIDER_SITE_OTHER): Payer: 59

## 2017-07-04 DIAGNOSIS — R0789 Other chest pain: Secondary | ICD-10-CM | POA: Diagnosis not present

## 2017-07-05 LAB — EXERCISE TOLERANCE TEST
CHL CUP MPHR: 175 {beats}/min
CHL CUP RESTING HR STRESS: 66 {beats}/min
CSEPPHR: 162 {beats}/min
Estimated workload: 10.5 METS
Exercise duration (min): 9 min
Exercise duration (sec): 18 s
Percent HR: 92 %
RPE: 16

## 2017-07-06 ENCOUNTER — Ambulatory Visit: Payer: 59 | Admitting: Cardiovascular Disease

## 2017-07-12 IMAGING — RF DG UGI W/ HIGH DENSITY W/KUB
5 series · 14 of 24 positions shown · non-contrast
Comparison: 06/09/2008.

CLINICAL DATA: 46-year-old female with reflux. Heartburn.
Epigastric/ abdominal pain. Initial encounter.

EXAM:
UPPER GI SERIES WITH KUB
TECHNIQUE: After obtaining a scout radiograph a routine upper GI series was
performed using thin and high density barium.
FLUOROSCOPY TIME:  Fluoroscopy Time:  1 minutes and 18 seconds
Radiation Exposure Index (if provided by the fluoroscopic device):
316 mGy

[Series 1: one shot · 2 of 3 slices shown (1 of 3)]
[im 1/3]
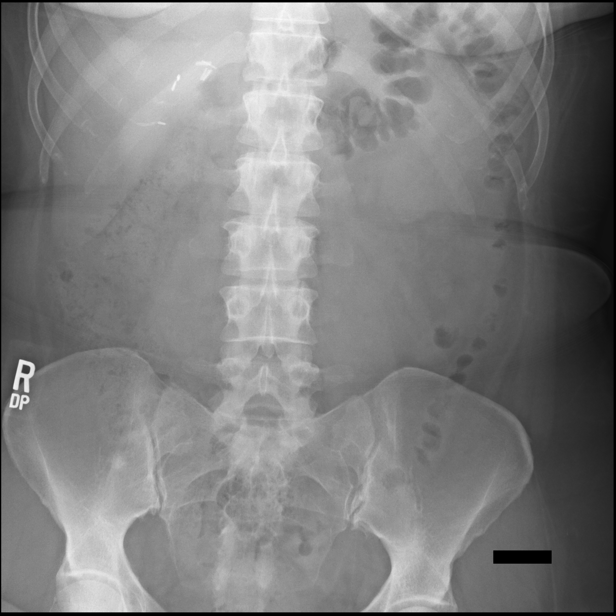
[im 3/3]
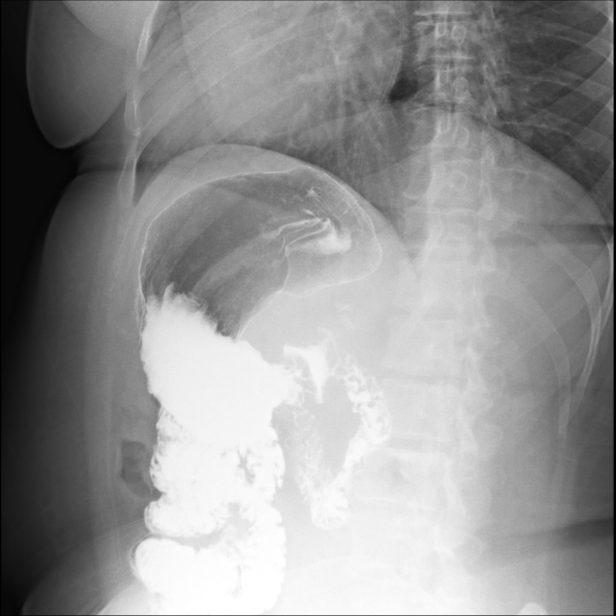

[Series 2: sequence · 1 of 11 frames shown (1 of 2)]
[frame 6/11]
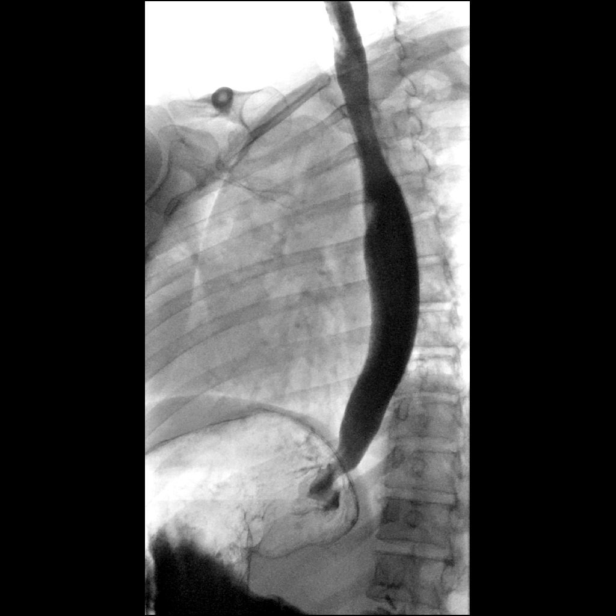

[Series 3: one shot · 6 of 10 slices shown (2 of 3)]
[im 1/10]
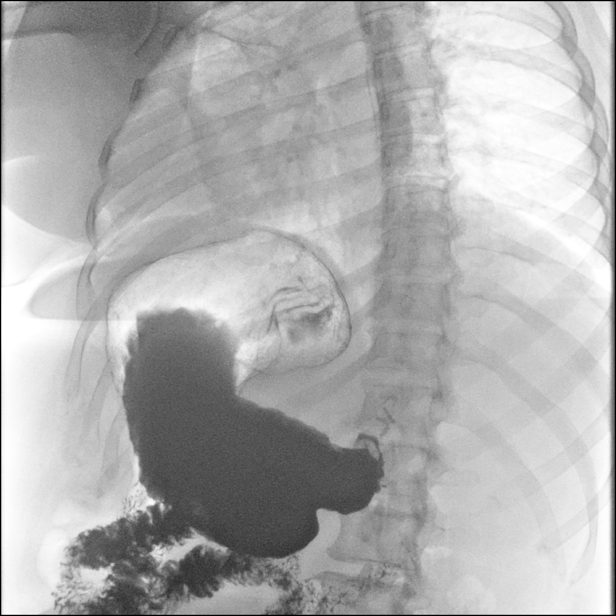
[im 2/10]
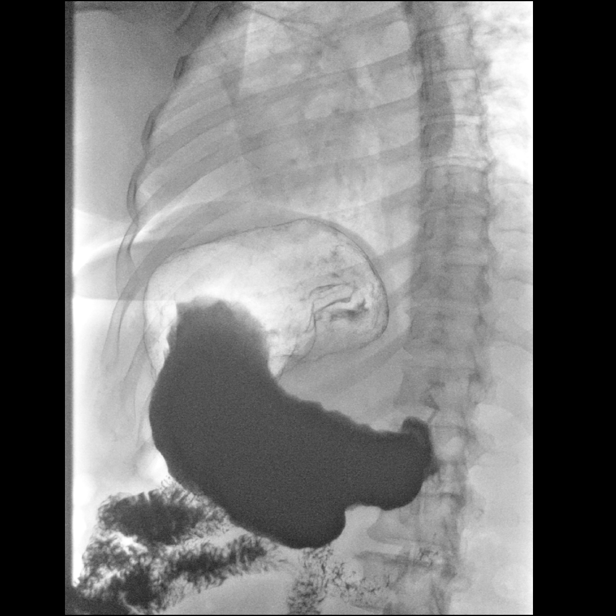
[im 4/10]
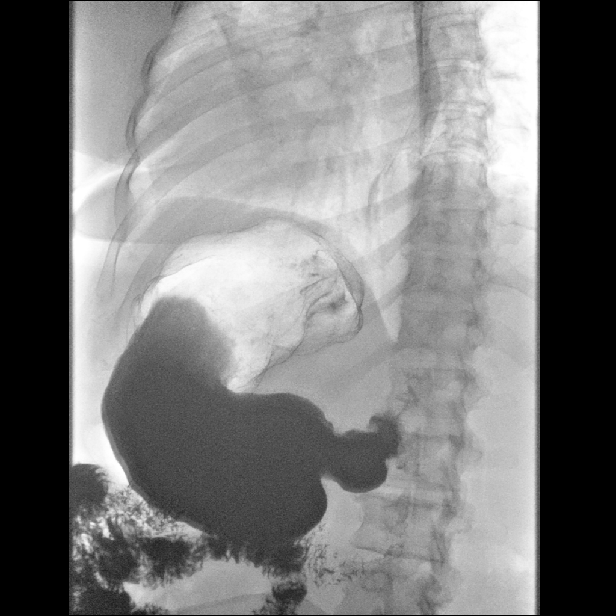
[im 6/10]
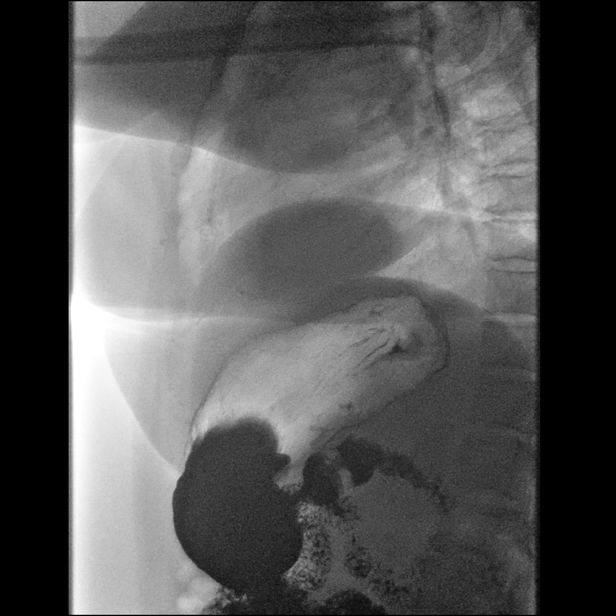
[im 7/10]
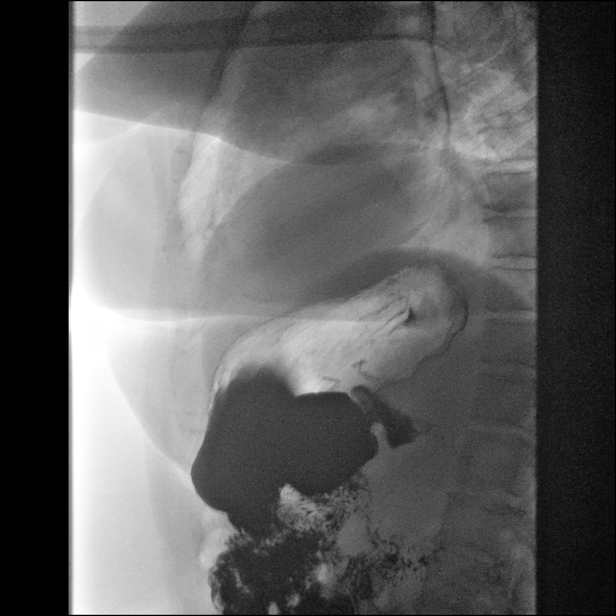
[im 9/10]
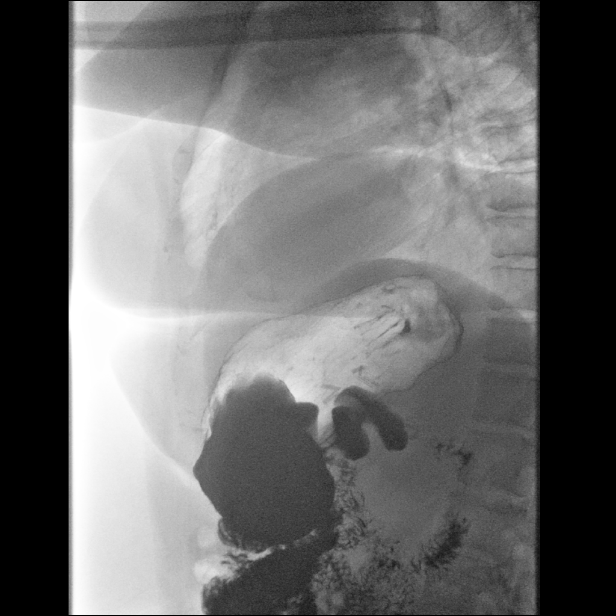

[Series 4: sequence · 2 of 21 frames shown (2 of 2)]
[frame 4/21]
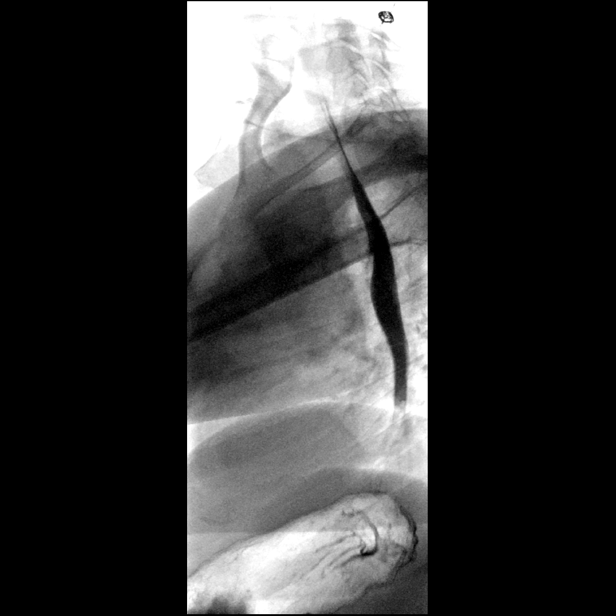
[frame 18/21]
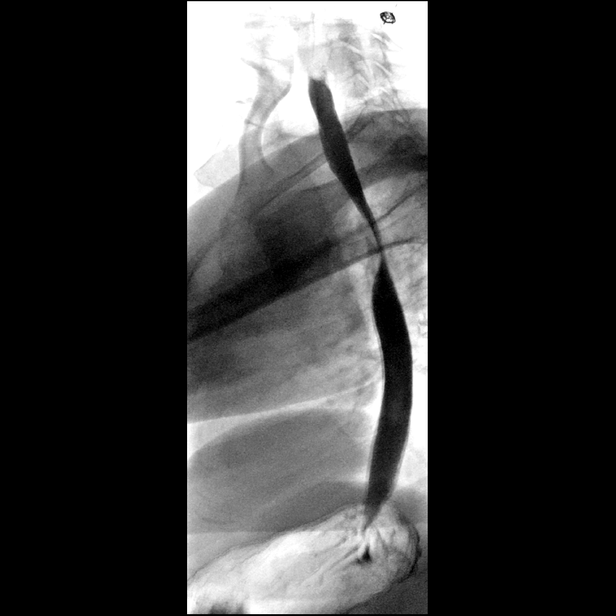

[Series 5: one shot · 3 of 5 slices shown (3 of 3)]
[im 1/5]
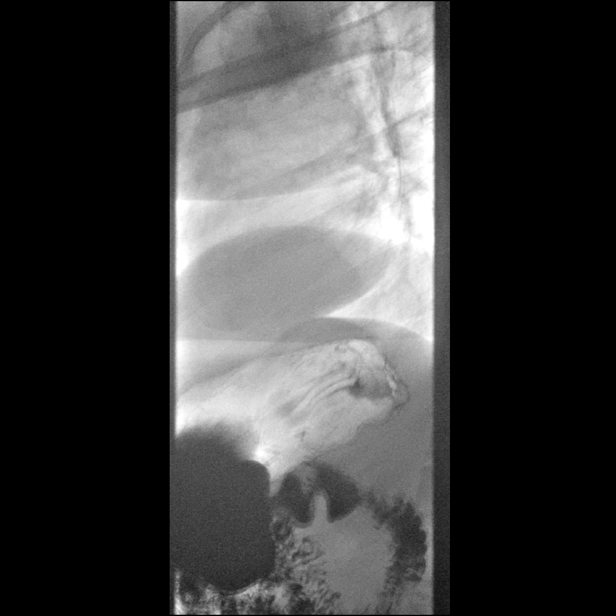
[im 3/5]
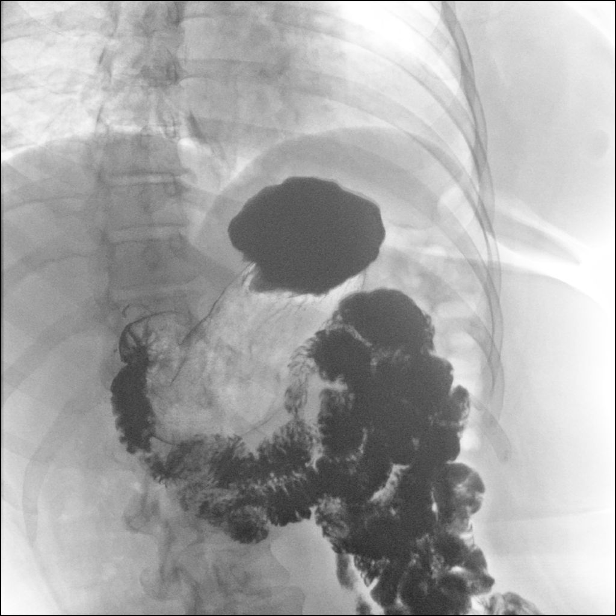
[im 5/5]
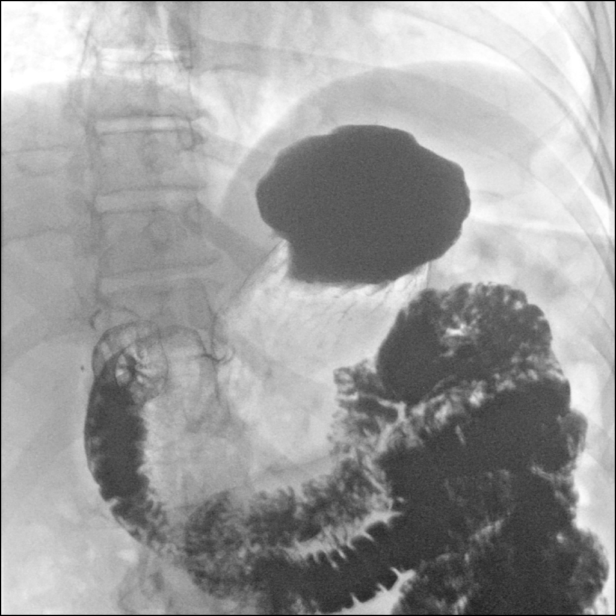

[14 of 24 positions shown; findings below may reference images not displayed]

FINDINGS: Scout view reveals patient is post cholecystectomy.

Normal primary esophageal stripping wave without esophageal
obstructing or constricting lesion.

Trace gastroesophageal reflux with change of patient position
reaching the mid thoracic esophagus level. No esophageal mucosal
abnormality detected.

Patient ingested a 13 mm barium tablet without any difficulty.

Gastric contour within normal limits without evidence of ulceration
or mass.

Duodenal bulb within normal limits without ulceration. Proximally
visualized small bowel within normal limits.
IMPRESSION: Trace gastroesophageal reflux with change of patient position
otherwise negative exam.

## 2018-01-08 DIAGNOSIS — D1801 Hemangioma of skin and subcutaneous tissue: Secondary | ICD-10-CM | POA: Diagnosis not present

## 2018-01-08 DIAGNOSIS — B0089 Other herpesviral infection: Secondary | ICD-10-CM | POA: Diagnosis not present

## 2018-01-08 DIAGNOSIS — D225 Melanocytic nevi of trunk: Secondary | ICD-10-CM | POA: Diagnosis not present

## 2018-01-08 DIAGNOSIS — L821 Other seborrheic keratosis: Secondary | ICD-10-CM | POA: Diagnosis not present

## 2018-01-08 DIAGNOSIS — D2262 Melanocytic nevi of left upper limb, including shoulder: Secondary | ICD-10-CM | POA: Diagnosis not present

## 2018-01-08 DIAGNOSIS — D2371 Other benign neoplasm of skin of right lower limb, including hip: Secondary | ICD-10-CM | POA: Diagnosis not present

## 2018-01-08 DIAGNOSIS — D2272 Melanocytic nevi of left lower limb, including hip: Secondary | ICD-10-CM | POA: Diagnosis not present

## 2018-01-08 MED FILL — valACYclovir HCL 1 GM TABS: 1 | 5 days supply | Qty: 20 | Fill #0

## 2018-02-14 DIAGNOSIS — D649 Anemia, unspecified: Secondary | ICD-10-CM | POA: Diagnosis not present

## 2018-02-14 DIAGNOSIS — G43009 Migraine without aura, not intractable, without status migrainosus: Secondary | ICD-10-CM | POA: Diagnosis not present

## 2018-02-14 DIAGNOSIS — K219 Gastro-esophageal reflux disease without esophagitis: Secondary | ICD-10-CM | POA: Diagnosis not present

## 2018-02-14 DIAGNOSIS — Z0000 Encounter for general adult medical examination without abnormal findings: Secondary | ICD-10-CM | POA: Diagnosis not present

## 2018-02-25 DIAGNOSIS — H52223 Regular astigmatism, bilateral: Secondary | ICD-10-CM | POA: Diagnosis not present

## 2018-02-25 DIAGNOSIS — H5203 Hypermetropia, bilateral: Secondary | ICD-10-CM | POA: Diagnosis not present

## 2018-02-25 DIAGNOSIS — H524 Presbyopia: Secondary | ICD-10-CM | POA: Diagnosis not present

## 2018-04-08 DIAGNOSIS — Z1231 Encounter for screening mammogram for malignant neoplasm of breast: Secondary | ICD-10-CM | POA: Diagnosis not present

## 2018-04-08 DIAGNOSIS — Z1151 Encounter for screening for human papillomavirus (HPV): Secondary | ICD-10-CM | POA: Diagnosis not present

## 2018-04-08 DIAGNOSIS — Z6835 Body mass index (BMI) 35.0-35.9, adult: Secondary | ICD-10-CM | POA: Diagnosis not present

## 2018-04-08 DIAGNOSIS — Z01419 Encounter for gynecological examination (general) (routine) without abnormal findings: Secondary | ICD-10-CM | POA: Diagnosis not present

## 2018-04-24 MED FILL — LOTEMAX 0.5% GEL: 0.5 | 12 days supply | Qty: 5 | Fill #0

## 2018-05-13 DIAGNOSIS — H04123 Dry eye syndrome of bilateral lacrimal glands: Secondary | ICD-10-CM | POA: Diagnosis not present

## 2018-05-14 MED FILL — LOTEMAX 0.5% GEL: 0.5 | 12 days supply | Qty: 5 | Fill #1

## 2018-05-14 MED FILL — RESTASIS 0.05% EYE EMULSION: 0.05 | 90 days supply | Qty: 180 | Fill #0

## 2018-08-13 ENCOUNTER — Telehealth: Payer: 59 | Admitting: Physician Assistant

## 2018-08-13 DIAGNOSIS — N3000 Acute cystitis without hematuria: Secondary | ICD-10-CM | POA: Diagnosis not present

## 2018-08-13 MED ORDER — CEPHALEXIN 500 MG PO CAPS: 500.0000 mg | ORAL_CAPSULE | Freq: Two times a day (BID) | ORAL | 0 refills | Status: AC

## 2018-08-13 MED FILL — CEPHALEXIN 500 MG CAPSULE: 500 | 5 days supply | Qty: 10 | Fill #0

## 2018-08-13 NOTE — Progress Notes (Signed)
We are sorry that you are not feeling well.  Here is how we plan to help!  Based on what you shared with me it looks like you most likely have a simple urinary tract infection.  A UTI (Urinary Tract Infection) is a bacterial infection of the bladder.  Most cases of urinary tract infections are simple to treat but a key part of your care is to encourage you to drink plenty of fluids and watch your symptoms carefully.  I have prescribed keflex 500 twice daily for five days.  Your symptoms should gradually improve. Call us if the burning in your urine worsens, you develop worsening fever, back pain or pelvic pain or if your symptoms do not resolve after completing the antibiotic.  Urinary tract infections can be prevented by drinking plenty of water to keep your body hydrated.  Also be sure when you wipe, wipe from front to back and don't hold it in!  If possible, empty your bladder every 4 hours.  Your e-visit answers were reviewed by a board certified advanced clinical practitioner to complete your personal care plan.  Depending on the condition, your plan could have included both over the counter or prescription medications.  If there is a problem please reply  once you have received a response from your provider.  Your safety is important to [place].  If you have drug allergies check your prescription carefully.    You can use MyChart to ask questions about today's visit, request a non-urgent call back, or ask for a work or school excuse for 24 hours related to this e-Visit. If it has been greater than 24 hours you will need to follow up with your provider, or enter a new e-Visit to address those concerns.   You will get an e-mail in the next two days asking about your experience.  I hope that your e-visit has been valuable and will speed your recovery. Thank you for using e-visits.

## 2018-10-10 ENCOUNTER — Encounter (INDEPENDENT_AMBULATORY_CARE_PROVIDER_SITE_OTHER): Payer: 59

## 2018-10-16 ENCOUNTER — Ambulatory Visit (INDEPENDENT_AMBULATORY_CARE_PROVIDER_SITE_OTHER): Payer: 59 | Admitting: Family Medicine

## 2018-10-16 ENCOUNTER — Encounter (INDEPENDENT_AMBULATORY_CARE_PROVIDER_SITE_OTHER): Payer: Self-pay | Admitting: Family Medicine

## 2018-10-16 VITALS — BP 100/61 | HR 66 | Temp 97.8°F | Ht 64.0 in | Wt 191.0 lb

## 2018-10-16 DIAGNOSIS — Z1331 Encounter for screening for depression: Secondary | ICD-10-CM | POA: Diagnosis not present

## 2018-10-16 DIAGNOSIS — Z9189 Other specified personal risk factors, not elsewhere classified: Secondary | ICD-10-CM

## 2018-10-16 DIAGNOSIS — R5383 Other fatigue: Secondary | ICD-10-CM | POA: Diagnosis not present

## 2018-10-16 DIAGNOSIS — E669 Obesity, unspecified: Secondary | ICD-10-CM | POA: Diagnosis not present

## 2018-10-16 DIAGNOSIS — R002 Palpitations: Secondary | ICD-10-CM

## 2018-10-16 DIAGNOSIS — Z0289 Encounter for other administrative examinations: Secondary | ICD-10-CM

## 2018-10-16 DIAGNOSIS — Z6832 Body mass index (BMI) 32.0-32.9, adult: Secondary | ICD-10-CM | POA: Diagnosis not present

## 2018-10-16 DIAGNOSIS — R0602 Shortness of breath: Secondary | ICD-10-CM

## 2018-10-16 DIAGNOSIS — G43009 Migraine without aura, not intractable, without status migrainosus: Secondary | ICD-10-CM | POA: Diagnosis not present

## 2018-10-17 LAB — CBC WITH DIFFERENTIAL
BASOS ABS: 0 10*3/uL (ref 0.0–0.2)
BASOS: 1 %
EOS (ABSOLUTE): 0 10*3/uL (ref 0.0–0.4)
Eos: 1 %
Hematocrit: 38.3 % (ref 34.0–46.6)
Hemoglobin: 13.1 g/dL (ref 11.1–15.9)
Immature Grans (Abs): 0 10*3/uL (ref 0.0–0.1)
Immature Granulocytes: 0 %
Lymphocytes Absolute: 2 10*3/uL (ref 0.7–3.1)
Lymphs: 34 %
MCH: 31.5 pg (ref 26.6–33.0)
MCHC: 34.2 g/dL (ref 31.5–35.7)
MCV: 92 fL (ref 79–97)
MONOS ABS: 0.2 10*3/uL (ref 0.1–0.9)
Monocytes: 4 %
NEUTROS PCT: 60 %
Neutrophils Absolute: 3.5 10*3/uL (ref 1.4–7.0)
RBC: 4.16 x10E6/uL (ref 3.77–5.28)
RDW: 11.8 % — AB (ref 12.3–15.4)
WBC: 5.8 10*3/uL (ref 3.4–10.8)

## 2018-10-17 LAB — COMPREHENSIVE METABOLIC PANEL
ALBUMIN: 4.5 g/dL (ref 3.5–5.5)
ALK PHOS: 56 IU/L (ref 39–117)
ALT: 26 IU/L (ref 0–32)
AST: 33 IU/L (ref 0–40)
Albumin/Globulin Ratio: 1.6 (ref 1.2–2.2)
BILIRUBIN TOTAL: 0.3 mg/dL (ref 0.0–1.2)
BUN / CREAT RATIO: 15 (ref 9–23)
BUN: 12 mg/dL (ref 6–24)
CO2: 24 mmol/L (ref 20–29)
Calcium: 9.6 mg/dL (ref 8.7–10.2)
Chloride: 102 mmol/L (ref 96–106)
Creatinine, Ser: 0.8 mg/dL (ref 0.57–1.00)
GFR calc Af Amer: 102 mL/min/{1.73_m2} (ref >59)
GFR calc non Af Amer: 89 mL/min/{1.73_m2} (ref >59)
Globulin, Total: 2.8 g/dL (ref 1.5–4.5)
Glucose: 78 mg/dL (ref 65–99)
POTASSIUM: 4.2 mmol/L (ref 3.5–5.2)
Sodium: 145 mmol/L — ABNORMAL HIGH (ref 134–144)
Total Protein: 7.3 g/dL (ref 6.0–8.5)

## 2018-10-17 LAB — INSULIN, RANDOM: INSULIN: 3.6 u[IU]/mL (ref 2.6–24.9)

## 2018-10-17 LAB — LIPID PANEL WITH LDL/HDL RATIO
Cholesterol, Total: 159 mg/dL (ref 100–199)
HDL: 55 mg/dL (ref >39)
LDL CALC: 95 mg/dL (ref 0–99)
LDl/HDL Ratio: 1.7 ratio (ref 0.0–3.2)
TRIGLYCERIDES: 47 mg/dL (ref 0–149)
VLDL CHOLESTEROL CAL: 9 mg/dL (ref 5–40)

## 2018-10-17 LAB — TSH: TSH: 2.16 u[IU]/mL (ref 0.450–4.500)

## 2018-10-17 LAB — VITAMIN B12: Vitamin B-12: 682 pg/mL (ref 232–1245)

## 2018-10-17 LAB — T4, FREE: Free T4: 1.1 ng/dL (ref 0.82–1.77)

## 2018-10-17 LAB — HEMOGLOBIN A1C
Est. average glucose Bld gHb Est-mCnc: 97 mg/dL
HEMOGLOBIN A1C: 5 % (ref 4.8–5.6)

## 2018-10-17 LAB — VITAMIN D 25 HYDROXY (VIT D DEFICIENCY, FRACTURES): Vit D, 25-Hydroxy: 31.9 ng/mL (ref 30.0–100.0)

## 2018-10-17 LAB — T3: T3, Total: 95 ng/dL (ref 71–180)

## 2018-10-17 LAB — FOLATE: Folate: >20 ng/mL (ref >3.0)

## 2018-10-22 NOTE — Progress Notes (Signed)
.  Office: [phone]  /  Fax: [phone]   HPI:   Chief Complaint: OBESITY  [name] (MR# [id number]) is a 47 y.o. female who presents on 10/16/2018 for obesity evaluation and treatment. Current BMI is Body mass index is 32.79 kg/m.  [name] has struggled with obesity for years and has been unsuccessful in either losing weight or maintaining long term weight loss. [name] attended our information session and states she is currently in the action stage of change and ready to dedicate time achieving and maintaining a healthier weight. [name] heard about our clinic from insurance incentives. She works 3rd shift for 2 to 3 nights a week.  [name] states her family eats meals together sometimes she thinks her family will eat healthier with her her desired weight loss is 41 lbs she has been heavy most of her life she started gaining weight around 40 or 47 years old her heaviest weight ever was 230 lbs. she has significant food cravings issues  she snacks sometimes in the evenings she is frequently drinking liquids with calories she frequently eats larger portions than normal  she has binge eating behaviors she struggles with emotional eating    Fatigue [name] feels her energy is lower than it should be. This has worsened with weight gain and has not worsened recently. [name] denies daytime somnolence and denies waking up still tired. Patient is at risk for obstructive sleep apnea. Patent has a history of symptoms of morning headaches with migraines. Patient generally gets 8 or 9 hours of sleep per night, and states they generally have generally restful sleep. An EKG was ordered today which showed normal sinus rhythm. Snoring is not present. Apneic episodes are not present. Epworth Sleepiness Score is 4.  Dyspnea on exertion [name] notes increasing shortness of breath with exercising and seems to be worsening over time with weight gain. She notes getting out of breath sooner  with activity than she used to. An EKG was ordered today which showed normal sinus rhythm. She had a recent stress test that was normal. This has not gotten worse recently. [name] denies orthopnea.  Migraine Headaches [name] reports 4 to 8 migraine headaches per month and she is only occasionally taking flexeril.  Palpitations [name] has seen cardiology in the past and had an Exercise Stress Test.  At risk for cardiovascular disease [name] is at a higher than average risk for cardiovascular disease due to palpitations and obesity. She currently denies any chest pain.  Depression Screen [name]'s Food and Mood (modified PHQ-9) score was 6.  Depression screen PHQ 2/9 10/16/2018  Decreased Interest 1  Down, Depressed, Hopeless 1  PHQ - 2 Score 2  Altered sleeping 0  Tired, decreased energy 1  Change in appetite 2  Feeling bad or failure about yourself  1  Trouble concentrating 0  Moving slowly or fidgety/restless 0  Suicidal thoughts 0  PHQ-9 Score 6  Difficult doing work/chores Not difficult at all    ALLERGIES: No Known Allergies  MEDICATIONS: Current Outpatient Medications on File Prior to Visit  Medication Sig Dispense Refill  . cyclobenzaprine (FLEXERIL) 10 MG tablet Take 10 mg by mouth 3 (three) times daily as needed for muscle spasms.     No current facility-administered medications on file prior to visit.     PAST MEDICAL HISTORY: Past Medical History:  Diagnosis Date  . Anemia   . Back pain   . Depression    followed by [name]  . Gallbladder problem   .  GERD (gastroesophageal reflux disease)   . History of kidney problems    1975, eval by 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 Kidney problem   . Migraines    [facility]  . Palpitations   . Vitamin D  deficiency     PAST SURGICAL HISTORY: Past Surgical History:  Procedure Laterality Date  . CESAREAN SECTION  2007 & 2009  . CHOLECYSTECTOMY  2009  . CYSTOSCOPY  age 29  . SHOULDER ARTHROSCOPY WITH SUBACROMIAL DECOMPRESSION, ROTATOR CUFF REPAIR AND BICEP TENDON REPAIR Right 02/11/2016   Procedure: RIGHT SHOULDER ARTHROSCOPY WITH SUBACROMIAL DECOMPRESSION, OPEN DCR,  OPEN MINI ROTATOR CUFF REPAIR, LABRAL REPAIR;  Surgeon: Netta Cedars, MD;  Location: [facility];  Service: Orthopedics;  Laterality: Right;  . TUBAL LIGATION    . uterine ablation      SOCIAL HISTORY: Social History   Tobacco Use  . Smoking status: Former Research scientist (life sciences)  . Smokeless tobacco: Never Used  Substance Use Topics  . Alcohol use: Yes    Comment: rare  . Drug use: No    FAMILY HISTORY: Family History  Problem Relation Age of Onset  . Hyperlipidemia Mother   . Depression Mother   . Cancer Mother   . Anxiety disorder Mother   . Obesity Mother   . Migraines Father   . Hypertension Father   . Hyperlipidemia Father   . Cancer Father   . Bipolar disorder Sister   . Diabetes Paternal Grandfather   . Heart disease Paternal Grandfather     ROS: Review of Systems  Constitutional: Positive for malaise/fatigue. Negative for weight loss.  HENT:       Positive for hay fever.  Eyes:       Positive for wearing glasses or contacts.  Respiratory: Positive for shortness of breath.   Cardiovascular: Positive for palpitations. Negative for chest pain and orthopnea.  Musculoskeletal: Positive for back pain.  Skin:       Positive for dryness.  Neurological: Positive for headaches.       Positive for migraine headaches.    PHYSICAL EXAM: Blood pressure 100/61, pulse 66, temperature 97.8 F (36.6 C), temperature source Oral, height 5\' 4"  (1.626 m), weight 191 lb (86.6 kg), last menstrual period 09/26/2018, SpO2 98 %. Body mass index is 32.79 kg/m. Physical Exam  Constitutional: She appears well-developed and  well-nourished.  HENT:  Head: Normocephalic and atraumatic.  Nose: Nose normal.  Eyes: EOM are normal. No scleral icterus.  Neck: Normal range of motion. Neck supple. No thyromegaly present.  Cardiovascular: Normal rate and regular rhythm.  Pulmonary/Chest: Effort normal. No respiratory distress.  Abdominal: Soft. There is no tenderness.  + obesity  Musculoskeletal:  Range of motion normal in all 4 extremities.  Skin: Skin is warm and dry.  Psychiatric: She has a normal mood and affect. Her behavior is normal.  Vitals reviewed.   RECENT LABS AND TESTS: BMET    Component Value Date/Time   NA 145 (H) 10/16/2018 1150   K 4.2 10/16/2018 1150   CL 102 10/16/2018 1150   CO2 24 10/16/2018 1150   GLUCOSE 78 10/16/2018 1150   GLUCOSE 98 02/01/2016 1129   BUN 12 10/16/2018 1150   CREATININE 0.80 10/16/2018 1150   CALCIUM 9.6 10/16/2018 1150   GFRNONAA 89 10/16/2018 1150  GFRAA 102 10/16/2018 1150   Lab Results  Component Value Date   HGBA1C 5.0 10/16/2018   Lab Results  Component Value Date   INSULIN 3.6 10/16/2018   CBC    Component Value Date/Time   WBC 5.8 10/16/2018 1150   WBC 9.0 02/01/2016 1129   RBC 4.16 10/16/2018 1150   RBC 4.41 02/01/2016 1129   HGB 13.1 10/16/2018 1150   HCT 38.3 10/16/2018 1150   PLT 200 02/01/2016 1129   MCV 92 10/16/2018 1150   MCH 31.5 10/16/2018 1150   MCH 31.1 02/01/2016 1129   MCHC 34.2 10/16/2018 1150   MCHC 33.9 02/01/2016 1129   RDW 11.8 (L) 10/16/2018 1150   LYMPHSABS 2.0 10/16/2018 1150   MONOABS 0.3 10/12/2008 0830   EOSABS 0.0 10/16/2018 1150   BASOSABS 0.0 10/16/2018 1150   Iron/TIBC/Ferritin/ %Sat No results found for: IRON, TIBC, FERRITIN, IRONPCTSAT Lipid Panel     Component Value Date/Time   CHOL 159 10/16/2018 1150   TRIG 47 10/16/2018 1150   HDL 55 10/16/2018 1150   LDLCALC 95 10/16/2018 1150   Hepatic Function Panel     Component Value Date/Time   PROT 7.3 10/16/2018 1150   ALBUMIN 4.5 10/16/2018 1150    AST 33 10/16/2018 1150   ALT 26 10/16/2018 1150   ALKPHOS 56 10/16/2018 1150   BILITOT 0.3 10/16/2018 1150   BILIDIR 0.1 05/05/2008 0114   IBILI 0.5 05/05/2008 0114      Component Value Date/Time   TSH 2.160 10/16/2018 1150    ECG  shows NSR with a rate of 70 BPM. INDIRECT CALORIMETER done today shows a VO2 of 285 and a REE of 1986. Her calculated basal metabolic rate is 4098 thus her basal metabolic rate is better than expected.  ASSESSMENT AND PLAN: Other fatigue - Plan: EKG 12-Lead, Comprehensive metabolic panel, CBC With Differential, Hemoglobin A1c, Insulin, random, Lipid Panel With LDL/HDL Ratio, VITAMIN D 25 Hydroxy (Vit-D Deficiency, Fractures), Vitamin B12, Folate, T3, T4, free, TSH  SOB (shortness of breath) on exertion - Plan: Comprehensive metabolic panel, CBC With Differential, Hemoglobin A1c, Insulin, random, Lipid Panel With LDL/HDL Ratio, VITAMIN D 25 Hydroxy (Vit-D Deficiency, Fractures), Vitamin B12, Folate, T3, T4, free, TSH  Migraine without aura and without status migrainosus, not intractable  Palpitations - Plan: Comprehensive metabolic panel, CBC With Differential  Depression screening  At risk for heart disease  Class 1 obesity with serious comorbidity and body mass index (BMI) of 32.0 to 32.9 in adult, unspecified obesity type  PLAN:  Fatigue Naomee was informed that her fatigue may be related to obesity, depression or many other causes. Labs will be ordered, and in the meanwhile Alida has agreed to work on diet, exercise and weight loss to help with fatigue. Proper sleep hygiene was discussed including the need for 7-8 hours of quality sleep each night. A sleep study was not ordered based on symptoms and Epworth score. We will order and EKG and an indirect calorimeter today.  Dyspnea on exertion Kamber's shortness of breath appears to be obesity related and exercise induced. She has agreed to work on weight loss and gradually increase exercise to  treat her exercise induced shortness of breath. If Loveta follows our instructions and loses weight without improvement of her shortness of breath, we will plan to refer to pulmonology. We will order an EKG, indirect calorimeter, and labs today. We will monitor this condition regularly. Makilah agrees to this plan.  Migraine Headaches Yariana agrees to follow  up with her next appointment in 2 weeks.  Palpitations We will order an EKG, indirect calorimeter, and labs today for Lorilyn. She agrees to follow up as directed.  Cardiovascular risk counseling Analiya was given extended (15 minutes) coronary artery disease prevention counseling today. She is 47 y.o. female and has risk factors for heart disease including palpitations and obesity. We discussed intensive lifestyle modifications today with an emphasis on specific weight loss instructions and strategies. Pt was also informed of the importance of increasing exercise and decreasing saturated fats to help prevent heart disease.  Depression Screen Maedell had a mildly positive depression screening. Depression is commonly associated with obesity and often results in emotional eating behaviors. We will monitor this closely and work on CBT to help improve the non-hunger eating patterns. Referral to Psychology may be required if no improvement is seen as she continues in our clinic.  Obesity Areyanna is currently in the action stage of change and her goal is to continue with weight loss efforts. She has agreed to follow the Category 3 plan. Marykathryn has been instructed to work up to a goal of 150 minutes of combined cardio and strengthening exercise per week for weight loss and overall health benefits. We discussed the following Behavioral Modification Strategies today: increasing lean protein intake, increasing vegetables, work on meal planning and easy cooking plans, and planning for success.  Bralyn has agreed to follow up with our clinic in 2  weeks. She was informed of the importance of frequent follow up visits to maximize her success with intensive lifestyle modifications for her multiple health conditions. She was informed we would discuss her lab results at her next visit unless there is a critical issue that needs to be addressed sooner. Maanasa agreed to keep her next visit at the agreed upon time to discuss these results.    OBESITY BEHAVIORAL INTERVENTION VISIT  Today's visit was # 1   Starting weight: 191 lbs Starting date: 10/16/18 Today's weight : Weight: 191 lb (86.6 kg)  Today's date: 10/16/2018 Total lbs lost to date: 0  ASK: We discussed the diagnosis of obesity with Myrtie Cruise today and Samanvi agreed to give [place] permission to discuss obesity behavioral modification therapy today.  ASSESS: Rayssa has the diagnosis of obesity and her BMI today is 32.9. Caidance is in the action stage of change.   ADVISE: Phyliss was educated on the multiple health risks of obesity as well as the benefit of weight loss to improve her health. She was advised of the need for long term treatment and the importance of lifestyle modifications to improve her current health and to decrease her risk of future health problems.  AGREE: Multiple dietary modification options and treatment options were discussed and Sharin agreed to follow the recommendations documented in the above note.  ARRANGE: Elia was educated on the importance of frequent visits to treat obesity as outlined per CMS and USPSTF guidelines and agreed to schedule her next follow up appointment today.   I, Marcille Blanco, am acting as Location manager for Eber Jones, MD   I have reviewed the above documentation for accuracy and completeness, and I agree with the above. - Ilene Qua, MD

## 2018-10-30 ENCOUNTER — Ambulatory Visit (INDEPENDENT_AMBULATORY_CARE_PROVIDER_SITE_OTHER): Payer: 59 | Admitting: Family Medicine

## 2018-10-30 ENCOUNTER — Encounter (INDEPENDENT_AMBULATORY_CARE_PROVIDER_SITE_OTHER): Payer: Self-pay | Admitting: Family Medicine

## 2018-10-30 VITALS — BP 106/69 | HR 75 | Temp 97.9°F | Ht 64.0 in | Wt 188.0 lb

## 2018-10-30 DIAGNOSIS — E559 Vitamin D deficiency, unspecified: Secondary | ICD-10-CM | POA: Diagnosis not present

## 2018-10-30 DIAGNOSIS — E669 Obesity, unspecified: Secondary | ICD-10-CM

## 2018-10-30 DIAGNOSIS — Z9189 Other specified personal risk factors, not elsewhere classified: Secondary | ICD-10-CM

## 2018-10-30 DIAGNOSIS — G43009 Migraine without aura, not intractable, without status migrainosus: Secondary | ICD-10-CM

## 2018-10-30 DIAGNOSIS — Z6832 Body mass index (BMI) 32.0-32.9, adult: Secondary | ICD-10-CM | POA: Diagnosis not present

## 2018-10-30 MED ORDER — VITAMIN D (ERGOCALCIFEROL) 1.25 MG (50000 UNIT) PO CAPS: 50000.0000 [IU] | ORAL_CAPSULE | ORAL | 0 refills | Status: DC

## 2018-10-30 MED FILL — VIT D2 1.25 MG (50,000 UNIT: 1.25 MG | 28 days supply | Qty: 4 | Fill #0

## 2018-11-04 DIAGNOSIS — H04123 Dry eye syndrome of bilateral lacrimal glands: Secondary | ICD-10-CM | POA: Diagnosis not present

## 2018-11-05 NOTE — Progress Notes (Signed)
Office: [phone]  /  Fax: [phone]   HPI:   Chief Complaint: OBESITY [name] is here to discuss her progress with her obesity treatment plan. She is on the Category 3 plan and is following her eating plan approximately 60 % of the time. She states she is exercising with personal trainer for 60 minutes 2 times per week. Briceyda went on vacation on the second week to [facility] and made good choices for most of the trip. She did have a few indulgences and indulged on Thanksgiving. She did thoroughly love [employer] bars.  Her weight is 188 lb (85.3 kg) today and has had a weight loss of 3 pounds over a period of 2 weeks since her last visit. She has lost 3 lbs since starting treatment with [place].  Vitamin D Deficiency [name] has a diagnosis of vitamin D deficiency. She is not on OTC Vit D. She notes fatigue and denies nausea, vomiting or muscle weakness.  At risk for osteopenia and osteoporosis [name] is at higher risk of osteopenia and osteoporosis due to vitamin D deficiency.   Migraine Headaches [name] has claith piercing for decreasing regularly occurring migraines. She did have 1 headache in the past week.  ALLERGIES: No Known Allergies  MEDICATIONS: Current Outpatient Medications on File Prior to Visit  Medication Sig Dispense Refill  . cyclobenzaprine (FLEXERIL) 10 MG tablet Take 10 mg by mouth 3 (three) times daily as needed for muscle spasms.     No current facility-administered medications on file prior to visit.     PAST MEDICAL HISTORY: Past Medical History:  Diagnosis Date  . Anemia   . Back pain   . Depression    followed by [name]  . Gallbladder problem   . GERD (gastroesophageal reflux disease)   . History of kidney problems    1975, eval by 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 Kidney problem   . Migraines    [facility]  . Palpitations   . Vitamin D deficiency     PAST SURGICAL HISTORY: Past Surgical History:  Procedure Laterality Date  . CESAREAN SECTION  2007 & 2009  . CHOLECYSTECTOMY  2009  . CYSTOSCOPY  age 70  . SHOULDER ARTHROSCOPY WITH SUBACROMIAL DECOMPRESSION, ROTATOR CUFF REPAIR AND BICEP TENDON REPAIR Right 02/11/2016   Procedure: RIGHT SHOULDER ARTHROSCOPY WITH SUBACROMIAL DECOMPRESSION, OPEN DCR,  OPEN MINI ROTATOR CUFF REPAIR, LABRAL REPAIR;  Surgeon: Netta Cedars, MD;  Location: [facility];  Service: Orthopedics;  Laterality: Right;  . TUBAL LIGATION    . uterine ablation      SOCIAL HISTORY: Social History   Tobacco Use  . Smoking status: Former Research scientist (life sciences)  . Smokeless tobacco: Never Used  Substance Use Topics  . Alcohol use: Yes    Comment: rare  . Drug use: No    FAMILY HISTORY: Family History  Problem Relation Age of Onset  . Hyperlipidemia Mother   . Depression Mother   . Cancer Mother   . Anxiety disorder Mother   . Obesity Mother   . Migraines Father   . Hypertension Father   . Hyperlipidemia Father   . Cancer Father   . Bipolar disorder Sister   . Diabetes Paternal Grandfather   . Heart disease Paternal Grandfather     ROS: Review  of Systems  Constitutional: Positive for malaise/fatigue and weight loss.  Gastrointestinal: Negative for nausea and vomiting.  Musculoskeletal:       Negative muscle weakness  Neurological: Positive for headaches (migraine).    PHYSICAL EXAM: Blood pressure 106/69, pulse 75, temperature 97.9 F (36.6 C), temperature source Oral, height 5\' 4"  (1.626 m), weight 188 lb (85.3 kg), SpO2 98 %. Body mass index is 32.27 kg/m. Physical Exam  Constitutional: She is oriented to person, place, and time. She appears well-developed and well-nourished.  Cardiovascular: Normal rate.  Pulmonary/Chest: Effort normal.  Musculoskeletal: Normal range of motion.    Neurological: She is oriented to person, place, and time.  Skin: Skin is warm and dry.  Psychiatric: She has a normal mood and affect. Her behavior is normal.  Vitals reviewed.   RECENT LABS AND TESTS: BMET    Component Value Date/Time   NA 145 (H) 10/16/2018 1150   K 4.2 10/16/2018 1150   CL 102 10/16/2018 1150   CO2 24 10/16/2018 1150   GLUCOSE 78 10/16/2018 1150   GLUCOSE 98 02/01/2016 1129   BUN 12 10/16/2018 1150   CREATININE 0.80 10/16/2018 1150   CALCIUM 9.6 10/16/2018 1150   GFRNONAA 89 10/16/2018 1150   GFRAA 102 10/16/2018 1150   Lab Results  Component Value Date   HGBA1C 5.0 10/16/2018   Lab Results  Component Value Date   INSULIN 3.6 10/16/2018   CBC    Component Value Date/Time   WBC 5.8 10/16/2018 1150   WBC 9.0 02/01/2016 1129   RBC 4.16 10/16/2018 1150   RBC 4.41 02/01/2016 1129   HGB 13.1 10/16/2018 1150   HCT 38.3 10/16/2018 1150   PLT 200 02/01/2016 1129   MCV 92 10/16/2018 1150   MCH 31.5 10/16/2018 1150   MCH 31.1 02/01/2016 1129   MCHC 34.2 10/16/2018 1150   MCHC 33.9 02/01/2016 1129   RDW 11.8 (L) 10/16/2018 1150   LYMPHSABS 2.0 10/16/2018 1150   MONOABS 0.3 10/12/2008 0830   EOSABS 0.0 10/16/2018 1150   BASOSABS 0.0 10/16/2018 1150   Iron/TIBC/Ferritin/ %Sat No results found for: IRON, TIBC, FERRITIN, IRONPCTSAT Lipid Panel     Component Value Date/Time   CHOL 159 10/16/2018 1150   TRIG 47 10/16/2018 1150   HDL 55 10/16/2018 1150   LDLCALC 95 10/16/2018 1150   Hepatic Function Panel     Component Value Date/Time   PROT 7.3 10/16/2018 1150   ALBUMIN 4.5 10/16/2018 1150   AST 33 10/16/2018 1150   ALT 26 10/16/2018 1150   ALKPHOS 56 10/16/2018 1150   BILITOT 0.3 10/16/2018 1150   BILIDIR 0.1 05/05/2008 0114   IBILI 0.5 05/05/2008 0114      Component Value Date/Time   TSH 2.160 10/16/2018 1150  Results for AMBUR, PROVINCE (MRN [id number]) as of 11/05/2018 09:41  Ref. Range 10/16/2018 11:50  Vitamin D, 25-Hydroxy  Latest Ref Range: 30.0 - 100.0 ng/mL 31.9    ASSESSMENT AND PLAN: Vitamin D deficiency - Plan: Vitamin D, Ergocalciferol, (DRISDOL) 1.25 MG (50000 UT) CAPS capsule  Migraine without aura and without status migrainosus, not intractable  At risk for osteoporosis  Class 1 obesity with serious comorbidity and body mass index (BMI) of 32.0 to 32.9 in adult, unspecified obesity type  PLAN:  Vitamin D Deficiency Hiromi was informed that low vitamin D levels contributes to fatigue and are associated with obesity, breast, and colon cancer. Ely agrees start to prescription Vit D @50 ,000 IU every week #4 with  no refills. She will follow up for routine testing of vitamin D, at least 2-3 times per year. She was informed of the risk of over-replacement of vitamin D and agrees to not increase her dose unless she discusses this with [place] first. [name] agrees to follow up with our clinic in 2 weeks.  At risk for osteopenia and osteoporosis [name] was given extended (30 minutes) osteoporosis prevention counseling today. [name] is at risk for osteopenia and osteoporsis due to her vitamin D deficiency. She was encouraged to take her vitamin D and follow her higher calcium diet and increase strengthening exercise to help strengthen her bones and decrease her risk of osteopenia and osteoporosis.  Migraine Headaches [name] is to see her primary care physician for further management as needed. [name] agrees to follow up with our clinic in 2 weeks.  Obesity [name] is currently in the action stage of change. As such, her goal is to continue with weight loss efforts She has agreed to follow the Category 3 plan [name] has been instructed to work up to a goal of 150 minutes of combined cardio and strengthening exercise per week for weight loss and overall health benefits. We discussed the following Behavioral Modification Strategies today: increasing lean protein intake, increasing vegetables, work on meal  planning and easy cooking plans, holiday eating strategies, better snacking choices, and planning for success    [name] has agreed to follow up with our clinic in 2 weeks. She was informed of the importance of frequent follow up visits to maximize her success with intensive lifestyle modifications for her multiple health conditions.   OBESITY BEHAVIORAL INTERVENTION VISIT  Today's visit was # 2   Starting weight: 191 lbs Starting date: 10/16/18 Today's weight : 188 lbs Today's date: 10/30/2018 Total lbs lost to date: 3    ASK: We discussed the diagnosis of obesity with [name] today and [name] agreed to give [place] permission to discuss obesity behavioral modification therapy today.  ASSESS: [name] has the diagnosis of obesity and her BMI today is 32.25 [name] is in the action stage of change   ADVISE: [name] was educated on the multiple health risks of obesity as well as the benefit of weight loss to improve her health. She was advised of the need for long term treatment and the importance of lifestyle modifications to improve her current health and to decrease her risk of future health problems.  AGREE: Multiple dietary modification options and treatment options were discussed and  [name] agreed to follow the recommendations documented in the above note.  ARRANGE: [name] was educated on the importance of frequent visits to treat obesity as outlined per CMS and USPSTF guidelines and agreed to schedule her next follow up appointment today.  I, Trixie Dredge, am acting as transcriptionist for Ilene Qua, MD  I have reviewed the above documentation for accuracy and completeness, and I agree with the above. - Ilene Qua, MD

## 2018-11-14 ENCOUNTER — Encounter (INDEPENDENT_AMBULATORY_CARE_PROVIDER_SITE_OTHER): Payer: Self-pay | Admitting: Family Medicine

## 2018-11-14 ENCOUNTER — Ambulatory Visit (INDEPENDENT_AMBULATORY_CARE_PROVIDER_SITE_OTHER): Payer: 59 | Admitting: Family Medicine

## 2018-11-14 VITALS — BP 97/62 | HR 57 | Temp 98.3°F | Ht 64.0 in | Wt 187.0 lb

## 2018-11-14 DIAGNOSIS — E559 Vitamin D deficiency, unspecified: Secondary | ICD-10-CM | POA: Diagnosis not present

## 2018-11-14 DIAGNOSIS — Z87898 Personal history of other specified conditions: Secondary | ICD-10-CM | POA: Diagnosis not present

## 2018-11-14 DIAGNOSIS — E66811 Obesity, class 1: Secondary | ICD-10-CM

## 2018-11-14 DIAGNOSIS — Z6832 Body mass index (BMI) 32.0-32.9, adult: Secondary | ICD-10-CM | POA: Diagnosis not present

## 2018-11-14 DIAGNOSIS — E669 Obesity, unspecified: Secondary | ICD-10-CM

## 2018-11-14 NOTE — Progress Notes (Signed)
Office: [phone]  /  Fax: [phone]   HPI:   Chief Complaint: OBESITY [name] is here to discuss her progress with her obesity treatment plan. She is on the Category 3 plan and is following her eating plan approximately 70 % of the time. She states she is exercising with a personal trainer for 20-30 minutes 3 times per week. [name] has done well resisting some indulgences at holiday parties. Her mother recently brought sweets into her house. She has done better making sure she ate well throughout the day prior to indulgences.  Her weight is 187 lb (84.8 kg) today and has had a weight loss of 1 pound over a period of 2 weeks since her last visit. She has lost 4 lbs since starting treatment with [place].  Vitamin D Deficiency [name] has a diagnosis of vitamin D deficiency. She is currently taking prescription Vit D. She notes fatigue and denies nausea, vomiting or muscle weakness.  History of Palpitations [name] has a history of palpitations, and denies recent symptoms. She has seen Cardiology.  ASSESSMENT AND PLAN:  Vitamin D deficiency  History of palpitations  Class 1 obesity with serious comorbidity and body mass index (BMI) of 32.0 to 32.9 in adult, unspecified obesity type  PLAN:  Vitamin D Deficiency [name] was informed that low vitamin D levels contributes to fatigue and are associated with obesity, breast, and colon cancer. [name] agrees to continue taking prescription Vit D @50 ,000 IU every week and will follow up for routine testing of vitamin D, at least 2-3 times per year. She was informed of the risk of over-replacement of vitamin D and agrees to not increase her dose unless she discusses this with [place] first. [name] agrees to follow up with our clinic in 2 weeks.  History of Palpitations [name] will follow up with Cardiology as needed, and she agrees to follow up with our clinic in 2 weeks.  I spent > than 50% of the 15 minute visit on counseling as documented  in the note.  Obesity [name] is currently in the action stage of change. As such, her goal is to continue with weight loss efforts She has agreed to keep a food journal with 250-350 calories and 25+ grams of protein at breakfast daily and follow the Category 3 plan [name] has been instructed to work up to a goal of 150 minutes of combined cardio and strengthening exercise per week for weight loss and overall health benefits. We discussed the following Behavioral Modification Strategies today: increasing lean protein intake, work on meal planning and easy cooking plans, dealing with family or coworker sabotage, holiday eating strategies, and planning for success   [name] has agreed to follow up with our clinic in 2 weeks. She was informed of the importance of frequent follow up visits to maximize her success with intensive lifestyle modifications for her multiple health conditions.  ALLERGIES: No Known Allergies  MEDICATIONS: Current Outpatient Medications on File Prior to Visit  Medication Sig Dispense Refill  . cyclobenzaprine (FLEXERIL) 10 MG tablet Take 10 mg by mouth 3 (three) times daily as needed for muscle spasms.    . Vitamin D, Ergocalciferol, (DRISDOL) 1.25 MG (50000 UT) CAPS capsule Take 1 capsule (50,000 Units total) by mouth every 7 (seven) days. 4 capsule 0   No current facility-administered medications on file prior to visit.     PAST MEDICAL HISTORY: Past Medical History:  Diagnosis Date  . Anemia   . Back pain   . Depression  followed by [name]  . Gallbladder problem   . GERD (gastroesophageal reflux disease)   . History of kidney problems    1975, eval by 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 Kidney problem   . Migraines    [facility]  . Palpitations     . Vitamin D deficiency     PAST SURGICAL HISTORY: Past Surgical History:  Procedure Laterality Date  . CESAREAN SECTION  2007 & 2009  . CHOLECYSTECTOMY  2009  . CYSTOSCOPY  age 68  . SHOULDER ARTHROSCOPY WITH SUBACROMIAL DECOMPRESSION, ROTATOR CUFF REPAIR AND BICEP TENDON REPAIR Right 02/11/2016   Procedure: RIGHT SHOULDER ARTHROSCOPY WITH SUBACROMIAL DECOMPRESSION, OPEN DCR,  OPEN MINI ROTATOR CUFF REPAIR, LABRAL REPAIR;  Surgeon: Netta Cedars, MD;  Location: [place];  Service: Orthopedics;  Laterality: Right;  . TUBAL LIGATION    . uterine ablation      SOCIAL HISTORY: Social History   Tobacco Use  . Smoking status: Former Research scientist (life sciences)  . Smokeless tobacco: Never Used  Substance Use Topics  . Alcohol use: Yes    Comment: rare  . Drug use: No    FAMILY HISTORY: Family History  Problem Relation Age of Onset  . Hyperlipidemia Mother   . Depression Mother   . Cancer Mother   . Anxiety disorder Mother   . Obesity Mother   . Migraines Father   . Hypertension Father   . Hyperlipidemia Father   . Cancer Father   . Bipolar disorder Sister   . Diabetes Paternal Grandfather   . Heart disease Paternal Grandfather     ROS: Review of Systems  Constitutional: Positive for malaise/fatigue and weight loss.  Cardiovascular: Positive for palpitations.  Gastrointestinal: Negative for nausea and vomiting.  Musculoskeletal:       Negative muscle weakness    PHYSICAL EXAM: Blood pressure 97/62, pulse (!) 57, temperature 98.3 F (36.8 C), temperature source Oral, height 5\' 4"  (1.626 m), weight 187 lb (84.8 kg), SpO2 100 %. Body mass index is 32.1 kg/m. Physical Exam Vitals signs reviewed.  Constitutional:      Appearance: Normal appearance. She is obese.  Cardiovascular:     Rate and Rhythm: Normal rate.     Pulses: Normal pulses.  Pulmonary:     Effort: Pulmonary effort is normal.  Musculoskeletal: Normal range of motion.  Skin:    General: Skin is warm and dry.   Neurological:     Mental Status: She is alert and oriented to person, place, and time.  Psychiatric:        Mood and Affect: Mood normal.        Behavior: Behavior normal.     RECENT LABS AND TESTS: BMET    Component Value Date/Time   NA 145 (H) 10/16/2018 1150   K 4.2 10/16/2018 1150   CL 102 10/16/2018 1150   CO2 24 10/16/2018 1150   GLUCOSE 78 10/16/2018 1150   GLUCOSE 98 02/01/2016 1129   BUN 12 10/16/2018 1150   CREATININE 0.80 10/16/2018 1150   CALCIUM 9.6 10/16/2018 1150   GFRNONAA 89 10/16/2018 1150   GFRAA 102 10/16/2018 1150   Lab Results  Component Value Date   HGBA1C 5.0 10/16/2018   Lab Results  Component Value Date   INSULIN 3.6 10/16/2018   CBC  Component Value Date/Time   WBC 5.8 10/16/2018 1150   WBC 9.0 02/01/2016 1129   RBC 4.16 10/16/2018 1150   RBC 4.41 02/01/2016 1129   HGB 13.1 10/16/2018 1150   HCT 38.3 10/16/2018 1150   PLT 200 02/01/2016 1129   MCV 92 10/16/2018 1150   MCH 31.5 10/16/2018 1150   MCH 31.1 02/01/2016 1129   MCHC 34.2 10/16/2018 1150   MCHC 33.9 02/01/2016 1129   RDW 11.8 (L) 10/16/2018 1150   LYMPHSABS 2.0 10/16/2018 1150   MONOABS 0.3 10/12/2008 0830   EOSABS 0.0 10/16/2018 1150   BASOSABS 0.0 10/16/2018 1150   Iron/TIBC/Ferritin/ %Sat No results found for: IRON, TIBC, FERRITIN, IRONPCTSAT Lipid Panel     Component Value Date/Time   CHOL 159 10/16/2018 1150   TRIG 47 10/16/2018 1150   HDL 55 10/16/2018 1150   LDLCALC 95 10/16/2018 1150   Hepatic Function Panel     Component Value Date/Time   PROT 7.3 10/16/2018 1150   ALBUMIN 4.5 10/16/2018 1150   AST 33 10/16/2018 1150   ALT 26 10/16/2018 1150   ALKPHOS 56 10/16/2018 1150   BILITOT 0.3 10/16/2018 1150   BILIDIR 0.1 05/05/2008 0114   IBILI 0.5 05/05/2008 0114      Component Value Date/Time   TSH 2.160 10/16/2018 1150      OBESITY BEHAVIORAL INTERVENTION VISIT  Today's visit was # 3   Starting weight: 191 lbs Starting date:  10/16/18 Today's weight : 187 lbs Today's date: 11/14/2018 Total lbs lost to date: 4    ASK: We discussed the diagnosis of obesity with Myrtie Cruise today and Catlyn agreed to give [place] permission to discuss obesity behavioral modification therapy today.  ASSESS: Inita has the diagnosis of obesity and her BMI today is 32.08 Lucienne is in the action stage of change   ADVISE: Bricelyn was educated on the multiple health risks of obesity as well as the benefit of weight loss to improve her health. She was advised of the need for long term treatment and the importance of lifestyle modifications to improve her current health and to decrease her risk of future health problems.  AGREE: Multiple dietary modification options and treatment options were discussed and  Kamron agreed to follow the recommendations documented in the above note.  ARRANGE: Raileigh was educated on the importance of frequent visits to treat obesity as outlined per CMS and USPSTF guidelines and agreed to schedule her next follow up appointment today.  I, Trixie Dredge, am acting as transcriptionist for Ilene Qua, MD  I have reviewed the above documentation for accuracy and completeness, and I agree with the above. - Ilene Qua, MD

## 2018-12-02 ENCOUNTER — Encounter (INDEPENDENT_AMBULATORY_CARE_PROVIDER_SITE_OTHER): Payer: Self-pay | Admitting: Family Medicine

## 2018-12-02 ENCOUNTER — Ambulatory Visit (INDEPENDENT_AMBULATORY_CARE_PROVIDER_SITE_OTHER): Payer: 59 | Admitting: Family Medicine

## 2018-12-02 VITALS — BP 101/69 | HR 71 | Temp 97.8°F | Ht 64.0 in | Wt 186.0 lb

## 2018-12-02 DIAGNOSIS — E669 Obesity, unspecified: Secondary | ICD-10-CM

## 2018-12-02 DIAGNOSIS — Z9189 Other specified personal risk factors, not elsewhere classified: Secondary | ICD-10-CM | POA: Diagnosis not present

## 2018-12-02 DIAGNOSIS — E66811 Obesity, class 1: Secondary | ICD-10-CM

## 2018-12-02 DIAGNOSIS — Z6831 Body mass index (BMI) 31.0-31.9, adult: Secondary | ICD-10-CM | POA: Diagnosis not present

## 2018-12-02 DIAGNOSIS — E559 Vitamin D deficiency, unspecified: Secondary | ICD-10-CM | POA: Diagnosis not present

## 2018-12-02 MED ORDER — VITAMIN D (ERGOCALCIFEROL) 1.25 MG (50000 UNIT) PO CAPS: 50000.0000 [IU] | ORAL_CAPSULE | ORAL | 0 refills | Status: DC

## 2018-12-03 ENCOUNTER — Encounter (INDEPENDENT_AMBULATORY_CARE_PROVIDER_SITE_OTHER): Payer: Self-pay | Admitting: Family Medicine

## 2018-12-03 DIAGNOSIS — Z6832 Body mass index (BMI) 32.0-32.9, adult: Secondary | ICD-10-CM

## 2018-12-03 DIAGNOSIS — E669 Obesity, unspecified: Secondary | ICD-10-CM | POA: Insufficient documentation

## 2018-12-03 DIAGNOSIS — E559 Vitamin D deficiency, unspecified: Secondary | ICD-10-CM | POA: Insufficient documentation

## 2018-12-03 NOTE — Progress Notes (Signed)
Office: [phone]  /  Fax: [phone]   HPI:   Chief Complaint: OBESITY Nasim is here to discuss her progress with her obesity treatment plan. She is on the Category 3 plan and is following her eating plan approximately 20 % of the time. She states she is working with a Physiological scientist with cardio and weights 45 to 60 minutes 3 to 4 times per week. Enjoli reports she has not been doing well with "self care" recently. Her appetite has been decreased. Her family dog died the day after Christmas suddenly which has been stressful for her entire family. She has a shake for breakfast daily within her journaling parameters.  She denies polyphagia.  Her weight is 186 lb (84.4 kg) today and has had a weight loss of 1 pound over a period of 3 weeks since her last visit. She has lost 5 lbs since starting treatment with [place].  Vitamin D deficiency Terrin has a diagnosis of vitamin D deficiency. She is currently taking vit D and is not at goal. Her last vitamin D level was 31 on 10/16/18. She denies nausea, vomiting, or muscle weakness.  At risk for osteopenia and osteoporosis Eilene is at higher risk of osteopenia and osteoporosis due to vitamin D deficiency.   ASSESSMENT AND PLAN:  Vitamin D deficiency - Plan: Vitamin D, Ergocalciferol, (DRISDOL) 1.25 MG (50000 UT) CAPS capsule  At risk for osteoporosis  Class 1 obesity with serious comorbidity and body mass index (BMI) of 31.0 to 31.9 in adult, unspecified obesity type  PLAN:  Vitamin D Deficiency Mackenzie was informed that low vitamin D levels contributes to fatigue and are associated with obesity, breast, and colon cancer. She agrees to continue to take prescription Vit D @50 ,000 IU every week #4 with no refills and will follow up for routine testing of vitamin D, at least 2-3 times per year. She was informed of the risk of over-replacement of vitamin D and agrees to not increase her dose unless she discusses this with [place] first.  Dametria agrees to follow up in 2 weeks.   At risk for osteopenia and osteoporosis Sade was given extended (15 minutes) osteoporosis prevention counseling today. Tarika is at risk for osteopenia and osteoporosis due to her vitamin D deficiency. She was encouraged to take her vitamin D and follow her higher calcium diet and increase strengthening exercise to help strengthen her bones and decrease her risk of osteopenia and osteoporosis.  Obesity Braley is currently in the action stage of change. As such, her goal is to continue with weight loss efforts. She has agreed to follow the Category 3 plan and keep a food journal of 250 to 350 calories and 25+ grams of protein for breakfast. Katleen has been instructed to work up to a goal of 150 minutes of combined cardio and strengthening exercise per week for weight loss and overall health benefits. We discussed the following Behavioral Modification Strategies today: work on meal planning and easy cooking plans and planning for success.  Shacola has agreed to follow up with our clinic in 2 weeks. She was informed of the importance of frequent follow up visits to maximize her success with intensive lifestyle modifications for her weight loss.  ALLERGIES: No Known Allergies  MEDICATIONS: Current Outpatient Medications on File Prior to Visit  Medication Sig Dispense Refill  . cyclobenzaprine (FLEXERIL) 10 MG tablet Take 10 mg by mouth 3 (three) times daily as needed for muscle spasms.     No current facility-administered  medications on file prior to visit.     PAST MEDICAL HISTORY: Past Medical History:  Diagnosis Date  . Anemia   . Back pain   . Depression    followed by [name]  . Gallbladder problem   . GERD (gastroesophageal reflux disease)   . History of kidney problems    1975, eval by 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 Kidney problem   . Migraines    [facility]  . Palpitations   . Vitamin D deficiency     PAST SURGICAL HISTORY: Past Surgical History:  Procedure Laterality Date  . CESAREAN SECTION  2007 & 2009  . CHOLECYSTECTOMY  2009  . CYSTOSCOPY  age 41  . SHOULDER ARTHROSCOPY WITH SUBACROMIAL DECOMPRESSION, ROTATOR CUFF REPAIR AND BICEP TENDON REPAIR Right 02/11/2016   Procedure: RIGHT SHOULDER ARTHROSCOPY WITH SUBACROMIAL DECOMPRESSION, OPEN DCR,  OPEN MINI ROTATOR CUFF REPAIR, LABRAL REPAIR;  Surgeon: Netta Cedars, MD;  Location: [place];  Service: Orthopedics;  Laterality: Right;  . TUBAL LIGATION    . uterine ablation      SOCIAL HISTORY: Social History   Tobacco Use  . Smoking status: Former Research scientist (life sciences)  . Smokeless tobacco: Never Used  Substance Use Topics  . Alcohol use: Yes    Comment: rare  . Drug use: No    FAMILY HISTORY: Family History  Problem Relation Age of Onset  . Hyperlipidemia Mother   . Depression Mother   . Cancer Mother   . Anxiety disorder Mother   . Obesity Mother   . Migraines Father   . Hypertension Father   . Hyperlipidemia Father   . Cancer Father   . Bipolar disorder Sister   . Diabetes Paternal Grandfather   . Heart disease Paternal Grandfather     ROS: Review of Systems  Constitutional: Positive for weight loss.  Gastrointestinal: Negative for nausea and vomiting.  Musculoskeletal:       Negative for muscle weakness.  Endo/Heme/Allergies:       Negative for polyphagia.   PHYSICAL EXAM: Blood pressure 101/69, pulse 71, temperature 97.8 F (36.6 C), temperature source Oral, height 5\' 4"  (1.626 m), weight 186 lb (84.4 kg), SpO2 97 %. Body mass index is 31.93 kg/m. Physical Exam Vitals signs reviewed.  Constitutional:      Appearance: Normal appearance. She is obese.  Cardiovascular:     Rate and Rhythm: Normal rate.  Pulmonary:     Effort:  Pulmonary effort is normal.  Musculoskeletal: Normal range of motion.  Skin:    General: Skin is warm and dry.  Neurological:     Mental Status: She is alert and oriented to person, place, and time.  Psychiatric:        Mood and Affect: Mood normal.        Behavior: Behavior normal.    RECENT LABS AND TESTS: BMET    Component Value Date/Time   NA 145 (H) 10/16/2018 1150   K 4.2 10/16/2018 1150   CL 102 10/16/2018 1150   CO2 24 10/16/2018 1150   GLUCOSE 78 10/16/2018 1150   GLUCOSE 98 02/01/2016 1129   BUN 12 10/16/2018 1150   CREATININE 0.80 10/16/2018 1150   CALCIUM 9.6 10/16/2018 1150   GFRNONAA 89 10/16/2018 1150   GFRAA 102 10/16/2018 1150  Lab Results  Component Value Date   HGBA1C 5.0 10/16/2018   Lab Results  Component Value Date   INSULIN 3.6 10/16/2018   CBC    Component Value Date/Time   WBC 5.8 10/16/2018 1150   WBC 9.0 02/01/2016 1129   RBC 4.16 10/16/2018 1150   RBC 4.41 02/01/2016 1129   HGB 13.1 10/16/2018 1150   HCT 38.3 10/16/2018 1150   PLT 200 02/01/2016 1129   MCV 92 10/16/2018 1150   MCH 31.5 10/16/2018 1150   MCH 31.1 02/01/2016 1129   MCHC 34.2 10/16/2018 1150   MCHC 33.9 02/01/2016 1129   RDW 11.8 (L) 10/16/2018 1150   LYMPHSABS 2.0 10/16/2018 1150   MONOABS 0.3 10/12/2008 0830   EOSABS 0.0 10/16/2018 1150   BASOSABS 0.0 10/16/2018 1150   Iron/TIBC/Ferritin/ %Sat No results found for: IRON, TIBC, FERRITIN, IRONPCTSAT Lipid Panel     Component Value Date/Time   CHOL 159 10/16/2018 1150   TRIG 47 10/16/2018 1150   HDL 55 10/16/2018 1150   LDLCALC 95 10/16/2018 1150   Hepatic Function Panel     Component Value Date/Time   PROT 7.3 10/16/2018 1150   ALBUMIN 4.5 10/16/2018 1150   AST 33 10/16/2018 1150   ALT 26 10/16/2018 1150   ALKPHOS 56 10/16/2018 1150   BILITOT 0.3 10/16/2018 1150   BILIDIR 0.1 05/05/2008 0114   IBILI 0.5 05/05/2008 0114      Component Value Date/Time   TSH 2.160 10/16/2018 1150   Results for  HYE, TRAWICK (MRN [id number]) as of 12/03/2018 10:29  Ref. Range 10/16/2018 11:50  Vitamin D, 25-Hydroxy Latest Ref Range: 30.0 - 100.0 ng/mL 31.9    OBESITY BEHAVIORAL INTERVENTION VISIT  Today's visit was # 4   Starting weight: 191 lbs Starting date: 10/16/18 Today's weight : Weight: 186 lb (84.4 kg)  Today's date: 12/02/2018 Total lbs lost to date: 5  ASK: We discussed the diagnosis of obesity with Myrtie Cruise today and Sayre agreed to give [place] permission to discuss obesity behavioral modification therapy today.  ASSESS: Luva has the diagnosis of obesity and her BMI today is 31.9. Reshonda is in the action stage of change.   ADVISE: Laraya was educated on the multiple health risks of obesity as well as the benefit of weight loss to improve her health. She was advised of the need for long term treatment and the importance of lifestyle modifications to improve her current health and to decrease her risk of future health problems.  AGREE: Multiple dietary modification options and treatment options were discussed and Sherma agreed to follow the recommendations documented in the above note.  ARRANGE: Mickayla was educated on the importance of frequent visits to treat obesity as outlined per CMS and USPSTF guidelines and agreed to schedule her next follow up appointment today.  I, Marcille Blanco, am acting as Location manager for [employer], FNP-C.  I have reviewed the above documentation for accuracy and completeness, and I agree with the above.  - Val Schiavo, FNP-C.

## 2018-12-18 ENCOUNTER — Ambulatory Visit (INDEPENDENT_AMBULATORY_CARE_PROVIDER_SITE_OTHER): Payer: 59 | Admitting: Family Medicine

## 2018-12-18 ENCOUNTER — Encounter (INDEPENDENT_AMBULATORY_CARE_PROVIDER_SITE_OTHER): Payer: Self-pay | Admitting: Family Medicine

## 2018-12-18 VITALS — BP 94/60 | HR 68 | Temp 97.8°F | Ht 64.0 in | Wt 189.0 lb

## 2018-12-18 DIAGNOSIS — E559 Vitamin D deficiency, unspecified: Secondary | ICD-10-CM

## 2018-12-18 DIAGNOSIS — E669 Obesity, unspecified: Secondary | ICD-10-CM | POA: Diagnosis not present

## 2018-12-18 DIAGNOSIS — Z6832 Body mass index (BMI) 32.0-32.9, adult: Secondary | ICD-10-CM | POA: Diagnosis not present

## 2018-12-18 NOTE — Progress Notes (Signed)
Office: [phone]  /  Fax: [phone]  HPI:   Chief Complaint: OBESITY [name] is here to discuss her progress with her obesity treatment plan. She is on the keep a food journal with 250-350 calories and 25+ grams of protein at breakfast daily and follow the Category 3 plan and is following her eating plan approximately 50% of the time. She states she is exercising with a trainer doing cardio and strength for 60 minutes 4 times per week. [name] just got back from an all inclusive trip to [place] 5 days ago. She drank quite a bit of alcohol and was off the plan. She got back on track yesterday.  Her weight is 189 lb (85.7 kg) today and has gained 3 pounds since her last visit. She has lost 2 lbs since starting treatment with [place].  Vitamin D Deficiency [name] has a diagnosis of vitamin D deficiency. She is currently taking prescription Vit D, level is not at goal. Last Vit D level was 31.9 on 10/16/18. She denies nausea, vomiting or muscle weakness.  ALLERGIES: No Known Allergies  MEDICATIONS: Current Outpatient Medications on File Prior to Visit  Medication Sig Dispense Refill  . cyclobenzaprine (FLEXERIL) 10 MG tablet Take 10 mg by mouth 3 (three) times daily as needed for muscle spasms.    . Vitamin D, Ergocalciferol, (DRISDOL) 1.25 MG (50000 UT) CAPS capsule Take 1 capsule (50,000 Units total) by mouth every 7 (seven) days. 4 capsule 0   No current facility-administered medications on file prior to visit.     PAST MEDICAL HISTORY: Past Medical History:  Diagnosis Date  . Anemia   . Back pain   . Depression    followed by [name]  . Gallbladder problem   . GERD (gastroesophageal reflux disease)   . History of kidney problems    1975, eval by 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 Kidney  problem   . Migraines    [facility]  . Palpitations   . Vitamin D deficiency     PAST SURGICAL HISTORY: Past Surgical History:  Procedure Laterality Date  . CESAREAN SECTION  2007 & 2009  . CHOLECYSTECTOMY  2009  . CYSTOSCOPY  age 78  . SHOULDER ARTHROSCOPY WITH SUBACROMIAL DECOMPRESSION, ROTATOR CUFF REPAIR AND BICEP TENDON REPAIR Right 02/11/2016   Procedure: RIGHT SHOULDER ARTHROSCOPY WITH SUBACROMIAL DECOMPRESSION, OPEN DCR,  OPEN MINI ROTATOR CUFF REPAIR, LABRAL REPAIR;  Surgeon: Netta Cedars, MD;  Location: [facility];  Service: Orthopedics;  Laterality: Right;  . TUBAL LIGATION    . uterine ablation      SOCIAL HISTORY: Social History   Tobacco Use  . Smoking status: Former Research scientist (life sciences)  . Smokeless tobacco: Never Used  Substance Use Topics  . Alcohol use: Yes    Comment: rare  . Drug use: No    FAMILY HISTORY: Family History  Problem Relation Age of Onset  . Hyperlipidemia Mother   . Depression Mother   . Cancer Mother   . Anxiety disorder Mother   . Obesity Mother   . Migraines Father   . Hypertension Father   . Hyperlipidemia Father   . Cancer Father   . Bipolar disorder Sister   . Diabetes Paternal Grandfather   . Heart disease Paternal Grandfather     ROS: Review  of Systems  Constitutional: Negative for weight loss.  Gastrointestinal: Negative for nausea and vomiting.  Musculoskeletal:       Negative muscle weakness    PHYSICAL EXAM: Blood pressure 94/60, pulse 68, temperature 97.8 F (36.6 C), temperature source Oral, height 5\' 4"  (1.626 m), weight 189 lb (85.7 kg), SpO2 97 %. Body mass index is 32.44 kg/m. Physical Exam Vitals signs reviewed.  Constitutional:      Appearance: Normal appearance. She is obese.  Cardiovascular:     Rate and Rhythm: Normal rate.     Pulses: Normal pulses.  Pulmonary:     Effort: Pulmonary effort is normal.     Breath sounds: Normal breath sounds.  Musculoskeletal: Normal range of motion.  Skin:     General: Skin is warm and dry.  Neurological:     Mental Status: She is alert and oriented to person, place, and time.  Psychiatric:        Mood and Affect: Mood normal.        Behavior: Behavior normal.     RECENT LABS AND TESTS: BMET    Component Value Date/Time   NA 145 (H) 10/16/2018 1150   K 4.2 10/16/2018 1150   CL 102 10/16/2018 1150   CO2 24 10/16/2018 1150   GLUCOSE 78 10/16/2018 1150   GLUCOSE 98 02/01/2016 1129   BUN 12 10/16/2018 1150   CREATININE 0.80 10/16/2018 1150   CALCIUM 9.6 10/16/2018 1150   GFRNONAA 89 10/16/2018 1150   GFRAA 102 10/16/2018 1150   Lab Results  Component Value Date   HGBA1C 5.0 10/16/2018   Lab Results  Component Value Date   INSULIN 3.6 10/16/2018   CBC    Component Value Date/Time   WBC 5.8 10/16/2018 1150   WBC 9.0 02/01/2016 1129   RBC 4.16 10/16/2018 1150   RBC 4.41 02/01/2016 1129   HGB 13.1 10/16/2018 1150   HCT 38.3 10/16/2018 1150   PLT 200 02/01/2016 1129   MCV 92 10/16/2018 1150   MCH 31.5 10/16/2018 1150   MCH 31.1 02/01/2016 1129   MCHC 34.2 10/16/2018 1150   MCHC 33.9 02/01/2016 1129   RDW 11.8 (L) 10/16/2018 1150   LYMPHSABS 2.0 10/16/2018 1150   MONOABS 0.3 10/12/2008 0830   EOSABS 0.0 10/16/2018 1150   BASOSABS 0.0 10/16/2018 1150   Iron/TIBC/Ferritin/ %Sat No results found for: IRON, TIBC, FERRITIN, IRONPCTSAT Lipid Panel     Component Value Date/Time   CHOL 159 10/16/2018 1150   TRIG 47 10/16/2018 1150   HDL 55 10/16/2018 1150   LDLCALC 95 10/16/2018 1150   Hepatic Function Panel     Component Value Date/Time   PROT 7.3 10/16/2018 1150   ALBUMIN 4.5 10/16/2018 1150   AST 33 10/16/2018 1150   ALT 26 10/16/2018 1150   ALKPHOS 56 10/16/2018 1150   BILITOT 0.3 10/16/2018 1150   BILIDIR 0.1 05/05/2008 0114   IBILI 0.5 05/05/2008 0114      Component Value Date/Time   TSH 2.160 10/16/2018 1150    ASSESSMENT AND PLAN: Vitamin D deficiency  Class 1 obesity with serious comorbidity and  body mass index (BMI) of 32.0 to 32.9 in adult, unspecified obesity type  PLAN:  Vitamin D Deficiency [name] was informed that low vitamin D levels contributes to fatigue and are associated with obesity, breast, and colon cancer. [name] agrees to continue taking prescription Vit D @50 ,000 IU every week and will follow up for routine testing of vitamin D, at least 2-3  times per year. She was informed of the risk of over-replacement of vitamin D and agrees to not increase her dose unless she discusses this with [place] first. [name] agrees to follow up with our clinic in 2 weeks.  Obesity [name] is currently in the action stage of change. As such, her goal is to continue with weight loss efforts She has agreed to follow the Category 3 plan [name] will continue current exercise regimen for weight loss and overall health benefits. We discussed the following Behavioral Modification Strategies today: celebration eating strategies and planning for success   I spent > than 50% of the 15 minute visit on counseling as documented in the note.  [name] has agreed to follow up with our clinic in 2 weeks. She was informed of the importance of frequent follow up visits to maximize her success with intensive lifestyle modifications for her multiple health conditions.   OBESITY BEHAVIORAL INTERVENTION VISIT  Today's visit was # 5  Starting weight: 191 lbs Starting date: 10/16/18 Today's weight : 189 lbs  Today's date: 12/18/2018 Total lbs lost to date: 2    ASK: We discussed the diagnosis of obesity with [name] today and [name] agreed to give [place] permission to discuss obesity behavioral modification therapy today.  ASSESS: Aviah has the diagnosis of obesity and her BMI today is 32.43 [name] is in the action stage of change   ADVISE: [name] was educated on the multiple health risks of obesity as well as the benefit of weight loss to improve her health. She was advised of the need for  long term treatment and the importance of lifestyle modifications to improve her current health and to decrease her risk of future health problems.  AGREE: Multiple dietary modification options and treatment options were discussed and  [name] agreed to follow the recommendations documented in the above note.  ARRANGE: [name] was educated on the importance of frequent visits to treat obesity as outlined per CMS and USPSTF guidelines and agreed to schedule her next follow up appointment today.  [name], am acting as Location manager for [employer], FNP-C.  I have reviewed the above documentation for accuracy and completeness, and I agree with the above.  - Shantel Wesely, FNP-C.

## 2018-12-23 ENCOUNTER — Encounter (INDEPENDENT_AMBULATORY_CARE_PROVIDER_SITE_OTHER): Payer: Self-pay | Admitting: Family Medicine

## 2018-12-30 ENCOUNTER — Ambulatory Visit (INDEPENDENT_AMBULATORY_CARE_PROVIDER_SITE_OTHER): Payer: 59 | Admitting: Family Medicine

## 2018-12-30 ENCOUNTER — Encounter (INDEPENDENT_AMBULATORY_CARE_PROVIDER_SITE_OTHER): Payer: Self-pay | Admitting: Family Medicine

## 2018-12-30 VITALS — BP 97/66 | HR 70 | Temp 97.9°F | Ht 64.0 in | Wt 189.0 lb

## 2018-12-30 DIAGNOSIS — E559 Vitamin D deficiency, unspecified: Secondary | ICD-10-CM | POA: Diagnosis not present

## 2018-12-30 DIAGNOSIS — Z6832 Body mass index (BMI) 32.0-32.9, adult: Secondary | ICD-10-CM | POA: Diagnosis not present

## 2018-12-30 DIAGNOSIS — E669 Obesity, unspecified: Secondary | ICD-10-CM

## 2018-12-30 NOTE — Progress Notes (Signed)
Office: [phone]  /  Fax: [phone]   HPI:   Chief Complaint: OBESITY Alease is here to discuss her progress with her obesity treatment plan. She is on the Category 3 plan and is following her eating plan approximately 70 % of the time. She states she is doing cardio and strength training 60 minutes 4 times per week. Aliyanah returned from trip with girlfriends yesterday. She indulged in alcohol and sweets. She is back on the plan today.  Her weight is 189 lb (85.7 kg) today and has not lost weight since her last visit. She has lost 2 lbs since starting treatment with [place].  Vitamin D deficiency Kalisa has a diagnosis of vitamin D deficiency. She is currently taking vit D, but is not at goal. Her last vitamin D level is 31.9 on 10/16/18. She denies nausea, vomiting, or muscle weakness.  ASSESSMENT AND PLAN:  Vitamin D deficiency  Class 1 obesity with serious comorbidity and body mass index (BMI) of 32.0 to 32.9 in adult, unspecified obesity type  PLAN:  Vitamin D Deficiency Breelle was informed that low vitamin D levels contributes to fatigue and are associated with obesity, breast, and colon cancer. She agrees to continue to take prescription Vit D @50 ,000 IU every week and will follow up for routine testing of vitamin D, at least 2-3 times per year. She was informed of the risk of over-replacement of vitamin D and agrees to not increase her dose unless she discusses this with [place] first. Cache agrees to follow up in 2 weeks.  I spent > than 50% of the 15 minute visit on counseling as documented in the note.  Obesity Vannah is currently in the action stage of change. As such, her goal is to continue with weight loss efforts. She has agreed to follow the Category 3 plan. Chivonne has been instructed to continue doing cardio and strength training 4 times per week. We discussed the following Behavioral Modification Strategies today: avoiding temptations and planning for  success.  Safina has agreed to follow up with our clinic in 2 weeks. She was informed of the importance of frequent follow up visits to maximize her success with intensive lifestyle modifications for her multiple health conditions.  ALLERGIES: No Known Allergies  MEDICATIONS: Current Outpatient Medications on File Prior to Visit  Medication Sig Dispense Refill  . cyclobenzaprine (FLEXERIL) 10 MG tablet Take 10 mg by mouth 3 (three) times daily as needed for muscle spasms.    . Vitamin D, Ergocalciferol, (DRISDOL) 1.25 MG (50000 UT) CAPS capsule Take 1 capsule (50,000 Units total) by mouth every 7 (seven) days. 4 capsule 0   No current facility-administered medications on file prior to visit.     PAST MEDICAL HISTORY: Past Medical History:  Diagnosis Date  . Anemia   . Back pain   . Depression    followed by [name]  . Gallbladder problem   . GERD (gastroesophageal reflux disease)   . History of kidney problems    1975, eval by 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 Kidney problem   . Migraines    [facility]  . Palpitations   . Vitamin D deficiency     PAST SURGICAL HISTORY: Past Surgical History:  Procedure Laterality Date  . CESAREAN SECTION  2007 &  2009  . CHOLECYSTECTOMY  2009  . CYSTOSCOPY  age 54  . SHOULDER ARTHROSCOPY WITH SUBACROMIAL DECOMPRESSION, ROTATOR CUFF REPAIR AND BICEP TENDON REPAIR Right 02/11/2016   Procedure: RIGHT SHOULDER ARTHROSCOPY WITH SUBACROMIAL DECOMPRESSION, OPEN DCR,  OPEN MINI ROTATOR CUFF REPAIR, LABRAL REPAIR;  Surgeon: Netta Cedars, MD;  Location: [facility];  Service: Orthopedics;  Laterality: Right;  . TUBAL LIGATION    . uterine ablation      SOCIAL HISTORY: Social History   Tobacco Use  . Smoking status: Former Research scientist (life sciences)  . Smokeless  tobacco: Never Used  Substance Use Topics  . Alcohol use: Yes    Comment: rare  . Drug use: No    FAMILY HISTORY: Family History  Problem Relation Age of Onset  . Hyperlipidemia Mother   . Depression Mother   . Cancer Mother   . Anxiety disorder Mother   . Obesity Mother   . Migraines Father   . Hypertension Father   . Hyperlipidemia Father   . Cancer Father   . Bipolar disorder Sister   . Diabetes Paternal Grandfather   . Heart disease Paternal Grandfather     ROS: Review of Systems  Constitutional: Negative for weight loss.  Gastrointestinal: Negative for nausea and vomiting.  Musculoskeletal:       Negative for muscle weakness.   PHYSICAL EXAM: Blood pressure 97/66, pulse 70, temperature 97.9 F (36.6 C), temperature source Oral, height 5\' 4"  (1.626 m), weight 189 lb (85.7 kg), last menstrual period 12/23/2018, SpO2 98 %. Body mass index is 32.44 kg/m. Physical Exam Vitals signs reviewed.  Constitutional:      Appearance: Normal appearance. She is obese.  Cardiovascular:     Rate and Rhythm: Normal rate.  Pulmonary:     Effort: Pulmonary effort is normal.  Musculoskeletal: Normal range of motion.  Skin:    General: Skin is warm and dry.  Neurological:     Mental Status: She is alert and oriented to person, place, and time.  Psychiatric:        Mood and Affect: Mood normal.        Behavior: Behavior normal.     RECENT LABS AND TESTS: BMET    Component Value Date/Time   NA 145 (H) 10/16/2018 1150   K 4.2 10/16/2018 1150   CL 102 10/16/2018 1150   CO2 24 10/16/2018 1150   GLUCOSE 78 10/16/2018 1150   GLUCOSE 98 02/01/2016 1129   BUN 12 10/16/2018 1150   CREATININE 0.80 10/16/2018 1150   CALCIUM 9.6 10/16/2018 1150   GFRNONAA 89 10/16/2018 1150   GFRAA 102 10/16/2018 1150   Lab Results  Component Value Date   HGBA1C 5.0 10/16/2018   Lab Results  Component Value Date   INSULIN 3.6 10/16/2018   CBC    Component Value Date/Time   WBC 5.8  10/16/2018 1150   WBC 9.0 02/01/2016 1129   RBC 4.16 10/16/2018 1150   RBC 4.41 02/01/2016 1129   HGB 13.1 10/16/2018 1150   HCT 38.3 10/16/2018 1150   PLT 200 02/01/2016 1129   MCV 92 10/16/2018 1150   MCH 31.5 10/16/2018 1150   MCH 31.1 02/01/2016 1129   MCHC 34.2 10/16/2018 1150   MCHC 33.9 02/01/2016 1129   RDW 11.8 (L) 10/16/2018 1150   LYMPHSABS 2.0 10/16/2018 1150   MONOABS 0.3 10/12/2008 0830   EOSABS 0.0 10/16/2018 1150   BASOSABS 0.0 10/16/2018 1150   Iron/TIBC/Ferritin/ %Sat No results found for: IRON, TIBC, FERRITIN, IRONPCTSAT Lipid  Panel     Component Value Date/Time   CHOL 159 10/16/2018 1150   TRIG 47 10/16/2018 1150   HDL 55 10/16/2018 1150   LDLCALC 95 10/16/2018 1150   Hepatic Function Panel     Component Value Date/Time   PROT 7.3 10/16/2018 1150   ALBUMIN 4.5 10/16/2018 1150   AST 33 10/16/2018 1150   ALT 26 10/16/2018 1150   ALKPHOS 56 10/16/2018 1150   BILITOT 0.3 10/16/2018 1150   BILIDIR 0.1 05/05/2008 0114   IBILI 0.5 05/05/2008 0114      Component Value Date/Time   TSH 2.160 10/16/2018 1150   Results for GRACIELA, PLATO (MRN [id number]) as of 12/30/2018 14:43  Ref. Range 10/16/2018 11:50  Vitamin D, 25-Hydroxy Latest Ref Range: 30.0 - 100.0 ng/mL 31.9    OBESITY BEHAVIORAL INTERVENTION VISIT  Today's visit was # 6   Starting weight: 191 lbs Starting date: 10/16/18 Today's weight : Weight: 189 lb (85.7 kg)  Today's date: 12/30/2018 Total lbs lost to date: 2  ASK: We discussed the diagnosis of obesity with Myrtie Cruise today and Vonnetta agreed to give [place] permission to discuss obesity behavioral modification therapy today.  ASSESS: Renarda has the diagnosis of obesity and her BMI today is 32.4. Rudene is in the action stage of change.   ADVISE: Ruvi was educated on the multiple health risks of obesity as well as the benefit of weight loss to improve her health. She was advised of the need for long term treatment and the  importance of lifestyle modifications to improve her current health and to decrease her risk of future health problems.  AGREE: Multiple dietary modification options and treatment options were discussed and Harsimran agreed to follow the recommendations documented in the above note.  ARRANGE: Latonya was educated on the importance of frequent visits to treat obesity as outlined per CMS and USPSTF guidelines and agreed to schedule her next follow up appointment today.  I, Marcille Blanco, am acting as Location manager for [employer], FNP-C.  I have reviewed the above documentation for accuracy and completeness, and I agree with the above.  - Ecko Beasley, FNP-C.

## 2019-01-01 MED FILL — VIT D2 1.25 MG (50,000 UNIT: 1.25 MG | 28 days supply | Qty: 4 | Fill #0

## 2019-01-15 ENCOUNTER — Ambulatory Visit (INDEPENDENT_AMBULATORY_CARE_PROVIDER_SITE_OTHER): Payer: 59 | Admitting: Family Medicine

## 2019-01-16 ENCOUNTER — Encounter (INDEPENDENT_AMBULATORY_CARE_PROVIDER_SITE_OTHER): Payer: Self-pay | Admitting: Physician Assistant

## 2019-01-16 ENCOUNTER — Ambulatory Visit (INDEPENDENT_AMBULATORY_CARE_PROVIDER_SITE_OTHER): Payer: 59 | Admitting: Physician Assistant

## 2019-01-16 VITALS — BP 95/61 | HR 63 | Temp 97.9°F | Ht 64.0 in | Wt 188.0 lb

## 2019-01-16 DIAGNOSIS — E559 Vitamin D deficiency, unspecified: Secondary | ICD-10-CM | POA: Diagnosis not present

## 2019-01-16 DIAGNOSIS — Z9189 Other specified personal risk factors, not elsewhere classified: Secondary | ICD-10-CM

## 2019-01-16 DIAGNOSIS — E669 Obesity, unspecified: Secondary | ICD-10-CM

## 2019-01-16 DIAGNOSIS — Z6832 Body mass index (BMI) 32.0-32.9, adult: Secondary | ICD-10-CM

## 2019-01-16 DIAGNOSIS — M549 Dorsalgia, unspecified: Secondary | ICD-10-CM | POA: Diagnosis not present

## 2019-01-16 MED ORDER — VITAMIN D (ERGOCALCIFEROL) 1.25 MG (50000 UNIT) PO CAPS: 50000.0000 [IU] | ORAL_CAPSULE | ORAL | 0 refills | Status: DC

## 2019-01-19 NOTE — Progress Notes (Signed)
Office: [phone]  /  Fax: [phone]   HPI:   Chief Complaint: OBESITY [name] is here to discuss her progress with her obesity treatment plan. She is on the Category 3 plan and is following her eating plan approximately 80 % of the time. She states she is exercising with a personal trainer at the gym for 60 minutes 2-3 times per week. [name] did very well with weight loss. She reports that she is not hungry throughout the day. She works as an Equities trader, and works night shifts at times. She is not getting all of her protein in.  Her weight is 188 lb (85.3 kg) today and has had a weight loss of 1 pound over a period of 2 to 3 weeks since her last visit. She has lost 3 lbs since starting treatment with [place].  Vitamin D Deficiency [name] has a diagnosis of vitamin D deficiency. She is currently taking prescription Vit D and denies nausea, vomiting or muscle weakness.  At risk for osteopenia and osteoporosis [name] is at higher risk of osteopenia and osteoporosis due to vitamin D deficiency.   Back Pain [name] complains of back pain. She is on cyclobenzaprine.  ALLERGIES: No Known Allergies  MEDICATIONS: Current Outpatient Medications on File Prior to Visit  Medication Sig Dispense Refill  . cyclobenzaprine (FLEXERIL) 10 MG tablet Take 10 mg by mouth 3 (three) times daily as needed for muscle spasms.     No current facility-administered medications on file prior to visit.     PAST MEDICAL HISTORY: Past Medical History:  Diagnosis Date  . Anemia   . Back pain   . Depression    followed by [name]  . Gallbladder problem   . GERD (gastroesophageal reflux disease)   . History of kidney problems    1975, eval by 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 Kidney problem   . Migraines     [facility]  . Palpitations   . Vitamin D deficiency     PAST SURGICAL HISTORY: Past Surgical History:  Procedure Laterality Date  . CESAREAN SECTION  2007 & 2009  . CHOLECYSTECTOMY  2009  . CYSTOSCOPY  age 89  . SHOULDER ARTHROSCOPY WITH SUBACROMIAL DECOMPRESSION, ROTATOR CUFF REPAIR AND BICEP TENDON REPAIR Right 02/11/2016   Procedure: RIGHT SHOULDER ARTHROSCOPY WITH SUBACROMIAL DECOMPRESSION, OPEN DCR,  OPEN MINI ROTATOR CUFF REPAIR, LABRAL REPAIR;  Surgeon: Netta Cedars, MD;  Location: [facility];  Service: Orthopedics;  Laterality: Right;  . TUBAL LIGATION    . uterine ablation      SOCIAL HISTORY: Social History   Tobacco Use  . Smoking status: Former Research scientist (life sciences)  . Smokeless tobacco: Never Used  Substance Use Topics  . Alcohol use: Yes    Comment: rare  . Drug use: No    FAMILY HISTORY: Family History  Problem Relation Age of Onset  . Hyperlipidemia Mother   . Depression Mother   . Cancer Mother   . Anxiety disorder Mother   . Obesity Mother   . Migraines Father   . Hypertension Father   . Hyperlipidemia Father   . Cancer Father   . Bipolar disorder Sister   . Diabetes Paternal Grandfather   . Heart disease Paternal Grandfather     ROS: Review of Systems  Constitutional:  Positive for weight loss.  Gastrointestinal: Negative for nausea and vomiting.  Musculoskeletal: Positive for back pain.       Negative muscle weakness    PHYSICAL EXAM: Blood pressure 95/61, pulse 63, temperature 97.9 F (36.6 C), temperature source Oral, height 5\' 4"  (1.626 m), weight 188 lb (85.3 kg), last menstrual period 12/23/2018, SpO2 100 %. Body mass index is 32.27 kg/m. Physical Exam Vitals signs reviewed.  Constitutional:      Appearance: Normal appearance. She is obese.  Cardiovascular:     Rate and Rhythm: Normal rate.     Pulses: Normal pulses.  Pulmonary:     Effort: Pulmonary effort is normal.     Breath sounds: Normal breath sounds.  Musculoskeletal:  Normal range of motion.  Skin:    General: Skin is warm and dry.  Neurological:     Mental Status: She is alert and oriented to person, place, and time.  Psychiatric:        Mood and Affect: Mood normal.        Behavior: Behavior normal.     RECENT LABS AND TESTS: BMET    Component Value Date/Time   NA 145 (H) 10/16/2018 1150   K 4.2 10/16/2018 1150   CL 102 10/16/2018 1150   CO2 24 10/16/2018 1150   GLUCOSE 78 10/16/2018 1150   GLUCOSE 98 02/01/2016 1129   BUN 12 10/16/2018 1150   CREATININE 0.80 10/16/2018 1150   CALCIUM 9.6 10/16/2018 1150   GFRNONAA 89 10/16/2018 1150   GFRAA 102 10/16/2018 1150   Lab Results  Component Value Date   HGBA1C 5.0 10/16/2018   Lab Results  Component Value Date   INSULIN 3.6 10/16/2018   CBC    Component Value Date/Time   WBC 5.8 10/16/2018 1150   WBC 9.0 02/01/2016 1129   RBC 4.16 10/16/2018 1150   RBC 4.41 02/01/2016 1129   HGB 13.1 10/16/2018 1150   HCT 38.3 10/16/2018 1150   PLT 200 02/01/2016 1129   MCV 92 10/16/2018 1150   MCH 31.5 10/16/2018 1150   MCH 31.1 02/01/2016 1129   MCHC 34.2 10/16/2018 1150   MCHC 33.9 02/01/2016 1129   RDW 11.8 (L) 10/16/2018 1150   LYMPHSABS 2.0 10/16/2018 1150   MONOABS 0.3 10/12/2008 0830   EOSABS 0.0 10/16/2018 1150   BASOSABS 0.0 10/16/2018 1150   Iron/TIBC/Ferritin/ %Sat No results found for: IRON, TIBC, FERRITIN, IRONPCTSAT Lipid Panel     Component Value Date/Time   CHOL 159 10/16/2018 1150   TRIG 47 10/16/2018 1150   HDL 55 10/16/2018 1150   LDLCALC 95 10/16/2018 1150   Hepatic Function Panel     Component Value Date/Time   PROT 7.3 10/16/2018 1150   ALBUMIN 4.5 10/16/2018 1150   AST 33 10/16/2018 1150   ALT 26 10/16/2018 1150   ALKPHOS 56 10/16/2018 1150   BILITOT 0.3 10/16/2018 1150   BILIDIR 0.1 05/05/2008 0114   IBILI 0.5 05/05/2008 0114      Component Value Date/Time   TSH 2.160 10/16/2018 1150    ASSESSMENT AND PLAN: Vitamin D deficiency - Plan:  Vitamin D, Ergocalciferol, (DRISDOL) 1.25 MG (50000 UT) CAPS capsule  Back pain, unspecified back location, unspecified back pain laterality, unspecified chronicity  At risk for osteoporosis  Class 1 obesity with serious comorbidity and body mass index (BMI) of 32.0 to 32.9 in adult, unspecified obesity type  PLAN:  Vitamin D Deficiency [name] was informed that low vitamin D levels contributes to fatigue and  are associated with obesity, breast, and colon cancer. [name] agrees to continue taking prescription Vit D @50 ,000 IU every week #4 and we will refill for 1 month. She will follow up for routine testing of vitamin D, at least 2-3 times per year. She was informed of the risk of over-replacement of vitamin D and agrees to not increase her dose unless she discusses this with [place] first. [name] agrees to follow up with our clinic in 2 weeks.  At risk for osteopenia and osteoporosis [name] was given extended (15 minutes) osteoporosis prevention counseling today. [name] is at risk for osteopenia and osteoporsis due to her vitamin D deficiency. She was encouraged to take her vitamin D and follow her higher calcium diet and increase strengthening exercise to help strengthen her bones and decrease her risk of osteopenia and osteoporosis.  Back Pain [name] will continue her medications and will continue to work on weight loss. [name] agrees to follow up with our clinic in 2 weeks.  Obesity [name] is currently in the action stage of change. As such, her goal is to continue with weight loss efforts She has agreed to change to keep a food journal with 1500 calories and 100 grams of protein daily [name] has been instructed to work up to a goal of 150 minutes of combined cardio and strengthening exercise per week for weight loss and overall health benefits. We discussed the following Behavioral Modification Strategies today: increasing lean protein intake and work on meal planning and easy  cooking plans   [name] has agreed to follow up with our clinic in 2 weeks. She was informed of the importance of frequent follow up visits to maximize her success with intensive lifestyle modifications for her multiple health conditions.   OBESITY BEHAVIORAL INTERVENTION VISIT  Today's visit was # 7   Starting weight: 191 lbs Starting date: 10/16/18 Today's weight : 188 lbs  Today's date: 01/16/2019 Total lbs lost to date: 3    ASK: We discussed the diagnosis of obesity with [name] today and [name] agreed to give [place] permission to discuss obesity behavioral modification therapy today.  ASSESS: [name] has the diagnosis of obesity and her BMI today is 32.25 [name] is in the action stage of change   ADVISE: [name] was educated on the multiple health risks of obesity as well as the benefit of weight loss to improve her health. She was advised of the need for long term treatment and the importance of lifestyle modifications.  AGREE: Multiple dietary modification options and treatment options were discussed and  [name] agreed to the above obesity treatment plan.  Wilhemena Durie, am acting as transcriptionist for Abby Potash, PA-C I, Abby Potash, PA-C have reviewed above note and agree with its content

## 2019-01-25 ENCOUNTER — Telehealth: Payer: 59 | Admitting: Physician Assistant

## 2019-01-25 DIAGNOSIS — R300 Dysuria: Secondary | ICD-10-CM | POA: Diagnosis not present

## 2019-01-25 MED ORDER — CEPHALEXIN 500 MG PO CAPS: 500.0000 mg | ORAL_CAPSULE | Freq: Two times a day (BID) | ORAL | 0 refills | Status: AC

## 2019-01-25 NOTE — Progress Notes (Signed)
I have spent 5 minutes in review of e-visit questionnaire, review and updating patient chart, medical decision making and response to patient.   Rexanne Inocencio Cody Amri Lien, PA-C    

## 2019-01-25 NOTE — Progress Notes (Signed)

## 2019-02-04 ENCOUNTER — Ambulatory Visit (INDEPENDENT_AMBULATORY_CARE_PROVIDER_SITE_OTHER): Payer: 59 | Admitting: Family Medicine

## 2019-02-04 VITALS — BP 100/67 | HR 73 | Temp 97.9°F | Ht 64.0 in | Wt 187.0 lb

## 2019-02-04 DIAGNOSIS — E559 Vitamin D deficiency, unspecified: Secondary | ICD-10-CM | POA: Diagnosis not present

## 2019-02-04 DIAGNOSIS — E669 Obesity, unspecified: Secondary | ICD-10-CM

## 2019-02-04 DIAGNOSIS — Z6832 Body mass index (BMI) 32.0-32.9, adult: Secondary | ICD-10-CM | POA: Diagnosis not present

## 2019-02-04 NOTE — Progress Notes (Signed)
Office: [phone]  /  Fax: [phone]   HPI:   Chief Complaint: OBESITY [name] is here to discuss her progress with her obesity treatment plan. She is keeping a food journal with 1500 calories and 100 grams of protein daily and is following her eating plan approximately 80% of the time. She states she is working with a Physiological scientist 60 minutes 2 times per week and doing cardio 60 minutes 1-2 times per week. [name] feels weight loss is very slow but she is determined. She is going to [employer] and is working with a Clinical research associate. She is journaling very consistently. Her weight is 187 lb (84.8 kg) today and has had a weight loss of 1 pound over a period of 3 weeks since her last visit. She has lost 4 lbs since starting treatment with [place].  Vitamin D deficiency [name] has a diagnosis of Vitamin D deficiency and is currently not at goal. Her last Vitamin D level was reported to be 31.9 on 10/16/2018. She is currently taking prescription Vit D and denies nausea, vomiting or muscle weakness.  ASSESSMENT AND PLAN:  Vitamin D deficiency  Class 1 obesity with serious comorbidity and body mass index (BMI) of 32.0 to 32.9 in adult, unspecified obesity type  PLAN:  Vitamin D Deficiency [name] was informed that low Vitamin D levels contributes to fatigue and are associated with obesity, breast, and colon cancer. She agrees to continue taking prescription Vit D and will follow-up for routine testing of Vitamin D, at least 2-3 times per year. She was informed of the risk of over-replacement of Vitamin D and agrees to not increase her dose unless she discusses this with [place] first. [name] agrees to follow-up with our clinic in 3 weeks.  I spent > than 50% of the 15 minute visit on counseling as documented in the note.  Obesity Dream is currently in the action stage of change. As such, her goal is to continue with weight loss efforts. She was advised to lower her calorie goal to 1300-1400  calories per day and lower her protein goal to 85-95 grams per day. [name] has been instructed to continue her exercise regimen as above. We discussed the following Behavioral Modification Strategies today: planning for success and keep a strict food journal.  [name] has agreed to follow-up with our clinic in 3 weeks. She was informed of the importance of frequent follow up visits to maximize her success with intensive lifestyle modifications for her multiple health conditions.  ALLERGIES: No Known Allergies  MEDICATIONS: Current Outpatient Medications on File Prior to Visit  Medication Sig Dispense Refill  . cyclobenzaprine (FLEXERIL) 10 MG tablet Take 10 mg by mouth 3 (three) times daily as needed for muscle spasms.    . Vitamin D, Ergocalciferol, (DRISDOL) 1.25 MG (50000 UT) CAPS capsule Take 1 capsule (50,000 Units total) by mouth every 7 (seven) days. 4 capsule 0   No current facility-administered medications on file prior to visit.     PAST MEDICAL HISTORY: Past Medical History:  Diagnosis Date  . Anemia   . Back pain   . Depression    followed by [name]  . Gallbladder problem   . GERD (gastroesophageal reflux disease)   . History of kidney problems    1975, eval by 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 Kidney problem   . Migraines    [facility]  . Palpitations   . Vitamin D deficiency     PAST SURGICAL HISTORY: Past Surgical History:  Procedure Laterality Date  . CESAREAN SECTION  2007 & 2009  . CHOLECYSTECTOMY  2009  . CYSTOSCOPY  age 14  . SHOULDER ARTHROSCOPY WITH SUBACROMIAL DECOMPRESSION, ROTATOR CUFF REPAIR AND BICEP TENDON REPAIR Right 02/11/2016   Procedure: RIGHT SHOULDER ARTHROSCOPY WITH SUBACROMIAL DECOMPRESSION, OPEN DCR,  OPEN MINI ROTATOR CUFF  REPAIR, LABRAL REPAIR;  Surgeon: Netta Cedars, MD;  Location: [facility];  Service: Orthopedics;  Laterality: Right;  . TUBAL LIGATION    . uterine ablation      SOCIAL HISTORY: Social History   Tobacco Use  . Smoking status: Former Research scientist (life sciences)  . Smokeless tobacco: Never Used  Substance Use Topics  . Alcohol use: Yes    Comment: rare  . Drug use: No    FAMILY HISTORY: Family History  Problem Relation Age of Onset  . Hyperlipidemia Mother   . Depression Mother   . Cancer Mother   . Anxiety disorder Mother   . Obesity Mother   . Migraines Father   . Hypertension Father   . Hyperlipidemia Father   . Cancer Father   . Bipolar disorder Sister   . Diabetes Paternal Grandfather   . Heart disease Paternal Grandfather    ROS: Review of Systems  Constitutional: Positive for weight loss.  Gastrointestinal: Negative for nausea and vomiting.  Musculoskeletal:       Negative for muscle weakness.  Endo/Heme/Allergies:       Negative for hypoglycemia.   PHYSICAL EXAM: Blood pressure 100/67, pulse 73, temperature 97.9 F (36.6 C), height 5\' 4"  (1.626 m), weight 187 lb (84.8 kg), last menstrual period 01/20/2019, SpO2 98 %. Body mass index is 32.1 kg/m. Physical Exam Vitals signs reviewed.  Constitutional:      Appearance: Normal appearance. She is obese.  Cardiovascular:     Rate and Rhythm: Normal rate.     Pulses: Normal pulses.  Pulmonary:     Effort: Pulmonary effort is normal.     Breath sounds: Normal breath sounds.  Musculoskeletal: Normal range of motion.  Skin:    General: Skin is warm and dry.  Neurological:     Mental Status: She is alert and oriented to person, place, and time.  Psychiatric:        Behavior: Behavior normal.   RECENT LABS AND TESTS: BMET    Component Value Date/Time   NA 145 (H) 10/16/2018 1150   K 4.2 10/16/2018 1150   CL 102 10/16/2018 1150   CO2 24 10/16/2018 1150   GLUCOSE 78 10/16/2018 1150   GLUCOSE 98 02/01/2016 1129   BUN 12  10/16/2018 1150   CREATININE 0.80 10/16/2018 1150   CALCIUM 9.6 10/16/2018 1150   GFRNONAA 89 10/16/2018 1150   GFRAA 102 10/16/2018 1150   Lab Results  Component Value Date   HGBA1C 5.0 10/16/2018   Lab Results  Component Value Date   INSULIN 3.6 10/16/2018   CBC    Component Value Date/Time   WBC 5.8 10/16/2018 1150   WBC 9.0 02/01/2016 1129   RBC 4.16 10/16/2018 1150   RBC 4.41 02/01/2016 1129   HGB 13.1 10/16/2018 1150   HCT 38.3 10/16/2018 1150   PLT 200 02/01/2016 1129   MCV 92 10/16/2018 1150   MCH 31.5 10/16/2018 1150   MCH 31.1 02/01/2016 1129  MCHC 34.2 10/16/2018 1150   MCHC 33.9 02/01/2016 1129   RDW 11.8 (L) 10/16/2018 1150   LYMPHSABS 2.0 10/16/2018 1150   MONOABS 0.3 10/12/2008 0830   EOSABS 0.0 10/16/2018 1150   BASOSABS 0.0 10/16/2018 1150   Iron/TIBC/Ferritin/ %Sat No results found for: IRON, TIBC, FERRITIN, IRONPCTSAT Lipid Panel     Component Value Date/Time   CHOL 159 10/16/2018 1150   TRIG 47 10/16/2018 1150   HDL 55 10/16/2018 1150   LDLCALC 95 10/16/2018 1150   Hepatic Function Panel     Component Value Date/Time   PROT 7.3 10/16/2018 1150   ALBUMIN 4.5 10/16/2018 1150   AST 33 10/16/2018 1150   ALT 26 10/16/2018 1150   ALKPHOS 56 10/16/2018 1150   BILITOT 0.3 10/16/2018 1150   BILIDIR 0.1 05/05/2008 0114   IBILI 0.5 05/05/2008 0114      Component Value Date/Time   TSH 2.160 10/16/2018 1150   Results for LINETTA, REGNER (MRN [id number]) as of 02/04/2019 15:57  Ref. Range 10/16/2018 11:50  Vitamin D, 25-Hydroxy Latest Ref Range: 30.0 - 100.0 ng/mL 31.9   OBESITY BEHAVIORAL INTERVENTION VISIT  Today's visit was #8  Starting weight: 191 lbs Starting date: 10/16/2018 Today's weight: 187 lbs  Today's date: 02/04/2019 Total lbs lost to date: 4    02/04/2019  Height 5\' 4"  (1.626 m)  Weight 187 lb (84.8 kg)  BMI (Calculated) 32.08  BLOOD PRESSURE - SYSTOLIC 109  BLOOD PRESSURE - DIASTOLIC 67   Body Fat % 32.3 %  Total  Body Water (lbs) 77.2 lbs   ASK: We discussed the diagnosis of obesity with Myrtie Cruise today and Sharla agreed to give [place] permission to discuss obesity behavioral modification therapy today.  ASSESS: Miyah has the diagnosis of obesity and her BMI today is 32.08. Skyelyn is in the action stage of change.   ADVISE: Anjanette was educated on the multiple health risks of obesity as well as the benefit of weight loss to improve her health. She was advised of the need for long term treatment and the importance of lifestyle modifications to improve her current health and to decrease her risk of future health problems.  AGREE: Multiple dietary modification options and treatment options were discussed and  Emmakate agreed to follow the recommendations documented in the above note.  ARRANGE: Jamita was educated on the importance of frequent visits to treat obesity as outlined per CMS and USPSTF guidelines and agreed to schedule her next follow up appointment today.  IMichaelene Song, am acting as Location manager for [employer], FNP-C.  I have reviewed the above documentation for accuracy and completeness, and I agree with the above.  - Tishara Pizano, FNP-C.

## 2019-02-05 ENCOUNTER — Encounter (INDEPENDENT_AMBULATORY_CARE_PROVIDER_SITE_OTHER): Payer: Self-pay | Admitting: Family Medicine

## 2019-02-14 MED FILL — valACYclovir HCL 1 GM TABS: 1 | 5 days supply | Qty: 20 | Fill #0

## 2019-02-19 ENCOUNTER — Encounter (INDEPENDENT_AMBULATORY_CARE_PROVIDER_SITE_OTHER): Payer: Self-pay

## 2019-02-24 ENCOUNTER — Other Ambulatory Visit: Payer: Self-pay

## 2019-02-24 ENCOUNTER — Ambulatory Visit (INDEPENDENT_AMBULATORY_CARE_PROVIDER_SITE_OTHER): Payer: 59 | Admitting: Family Medicine

## 2019-02-24 ENCOUNTER — Encounter (INDEPENDENT_AMBULATORY_CARE_PROVIDER_SITE_OTHER): Payer: Self-pay | Admitting: Family Medicine

## 2019-02-24 DIAGNOSIS — E559 Vitamin D deficiency, unspecified: Secondary | ICD-10-CM

## 2019-02-24 DIAGNOSIS — E669 Obesity, unspecified: Secondary | ICD-10-CM | POA: Diagnosis not present

## 2019-02-24 DIAGNOSIS — Z6832 Body mass index (BMI) 32.0-32.9, adult: Secondary | ICD-10-CM | POA: Diagnosis not present

## 2019-02-25 NOTE — Progress Notes (Signed)
Office: [phone]  /  Fax: [phone] TeleHealth Visit:  [name] has consented to this TeleHealth visit today via telephone call. The patient is located at home, the provider is located at the [facility] office. The participants in this visit include the listed provider and patient.   HPI:   Chief Complaint: OBESITY [name] is here to discuss her progress with her obesity treatment plan. She is on the keep a food journal with 1300-1400 calories and 85-95 grams of protein daily and is following her eating plan approximately 80 % of the time. She states she is doing strength training for 60 minutes 3 times per week and cardio for 30 minutes 2 times per week. Annaly weighted 185 lbs on her home scale. She thinks she has lost 2 lbs. She is journaling most days. She sometimes has more calories than 1400 per day.  We were unable to weight the patient today for this TeleHealth visit. She feels as if she has lost 2 lbs since her last visit. She has lost 4-6 lbs since starting treatment with [place].  Vitamin D Deficiency [name] has a diagnosis of vitamin D deficiency. She is currently taking prescription Vit D, but level not at goal. Last Vit D level was 31.9 on 10/16/18. She denies nausea, vomiting or muscle weakness.  ASSESSMENT AND PLAN:  Vitamin D deficiency  Class 1 obesity with serious comorbidity and body mass index (BMI) of 32.0 to 32.9 in adult, unspecified obesity type  PLAN:  Vitamin D Deficiency [name] was informed that low vitamin D levels contributes to fatigue and are associated with obesity, breast, and colon cancer. [name] agrees to continue taking prescription Vit D @50 ,000 IU every week and will follow up for routine testing of vitamin D, at least 2-3 times per year. She was informed of the risk of over-replacement of vitamin D and agrees to not increase her dose unless she discusses this with [place] first. [name] agrees to follow up with our clinic in  2 to 3 weeks.  I spent > than 50% of the 15 minute visit on counseling as documented in the note.  Obesity [name] is currently in the action stage of change. As such, her goal is to continue with weight loss efforts She has agreed to keep a food journal with 1300-1400 calories and 85-95 grams of protein daily [name] will continue current exercise regimen for weight loss and overall health benefits. We discussed the following Behavioral Modification Strategies today: planning for success and keep a strict food journal   [name] has agreed to follow up with our clinic in 2 to 3 weeks. She was informed of the importance of frequent follow up visits to maximize her success with intensive lifestyle modifications for her multiple health conditions.  ALLERGIES: No Known Allergies  MEDICATIONS: Current Outpatient Medications on File Prior to Visit  Medication Sig Dispense Refill  . cyclobenzaprine (FLEXERIL) 10 MG tablet Take 10 mg by mouth 3 (three) times daily as needed for muscle spasms.    . Vitamin D, Ergocalciferol, (DRISDOL) 1.25 MG (50000 UT) CAPS capsule Take 1 capsule (50,000 Units total) by mouth every 7 (seven) days. 4 capsule 0   No current facility-administered medications on file prior to visit.     PAST MEDICAL HISTORY: Past Medical History:  Diagnosis Date  . Anemia   . Back pain   . Depression    followed by [name]  . Gallbladder problem   . GERD (gastroesophageal reflux  disease)   . History of kidney problems    1975, eval by 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 Kidney problem   . Migraines    [facility]  . Palpitations   . Vitamin D deficiency     PAST SURGICAL HISTORY: Past Surgical History:  Procedure Laterality Date  . CESAREAN SECTION  2007 & 2009   . CHOLECYSTECTOMY  2009  . CYSTOSCOPY  age 48  . SHOULDER ARTHROSCOPY WITH SUBACROMIAL DECOMPRESSION, ROTATOR CUFF REPAIR AND BICEP TENDON REPAIR Right 02/11/2016   Procedure: RIGHT SHOULDER ARTHROSCOPY WITH SUBACROMIAL DECOMPRESSION, OPEN DCR,  OPEN MINI ROTATOR CUFF REPAIR, LABRAL REPAIR;  Surgeon: Netta Cedars, MD;  Location: [facility];  Service: Orthopedics;  Laterality: Right;  . TUBAL LIGATION    . uterine ablation      SOCIAL HISTORY: Social History   Tobacco Use  . Smoking status: Former Research scientist (life sciences)  . Smokeless tobacco: Never Used  Substance Use Topics  . Alcohol use: Yes    Comment: rare  . Drug use: No    FAMILY HISTORY: Family History  Problem Relation Age of Onset  . Hyperlipidemia Mother   . Depression Mother   . Cancer Mother   . Anxiety disorder Mother   . Obesity Mother   . Migraines Father   . Hypertension Father   . Hyperlipidemia Father   . Cancer Father   . Bipolar disorder Sister   . Diabetes Paternal Grandfather   . Heart disease Paternal Grandfather     ROS: Review of Systems  Constitutional: Positive for weight loss.  Gastrointestinal: Negative for nausea and vomiting.  Musculoskeletal:       Negative muscle weakness    PHYSICAL EXAM: Pt in no acute distress  RECENT LABS AND TESTS: BMET    Component Value Date/Time   NA 145 (H) 10/16/2018 1150   K 4.2 10/16/2018 1150   CL 102 10/16/2018 1150   CO2 24 10/16/2018 1150   GLUCOSE 78 10/16/2018 1150   GLUCOSE 98 02/01/2016 1129   BUN 12 10/16/2018 1150   CREATININE 0.80 10/16/2018 1150   CALCIUM 9.6 10/16/2018 1150   GFRNONAA 89 10/16/2018 1150   GFRAA 102 10/16/2018 1150   Lab Results  Component Value Date   HGBA1C 5.0 10/16/2018   Lab Results  Component Value Date   INSULIN 3.6 10/16/2018   CBC    Component Value Date/Time   WBC 5.8 10/16/2018 1150   WBC 9.0 02/01/2016 1129   RBC 4.16 10/16/2018 1150   RBC 4.41 02/01/2016 1129   HGB 13.1 10/16/2018 1150   HCT 38.3 10/16/2018  1150   PLT 200 02/01/2016 1129   MCV 92 10/16/2018 1150   MCH 31.5 10/16/2018 1150   MCH 31.1 02/01/2016 1129   MCHC 34.2 10/16/2018 1150   MCHC 33.9 02/01/2016 1129   RDW 11.8 (L) 10/16/2018 1150   LYMPHSABS 2.0 10/16/2018 1150   MONOABS 0.3 10/12/2008 0830   EOSABS 0.0 10/16/2018 1150   BASOSABS 0.0 10/16/2018 1150   Iron/TIBC/Ferritin/ %Sat No results found for: IRON, TIBC, FERRITIN, IRONPCTSAT Lipid Panel     Component Value Date/Time   CHOL 159 10/16/2018 1150   TRIG 47 10/16/2018 1150   HDL 55 10/16/2018 1150   LDLCALC 95 10/16/2018 1150   Hepatic Function Panel  Component Value Date/Time   PROT 7.3 10/16/2018 1150   ALBUMIN 4.5 10/16/2018 1150   AST 33 10/16/2018 1150   ALT 26 10/16/2018 1150   ALKPHOS 56 10/16/2018 1150   BILITOT 0.3 10/16/2018 1150   BILIDIR 0.1 05/05/2008 0114   IBILI 0.5 05/05/2008 0114      Component Value Date/Time   TSH 2.160 10/16/2018 1150      I, Trixie Dredge, am acting as Location manager for [employer], FNP-C.  I have reviewed the above documentation for accuracy and completeness, and I agree with the above.  - Sheila Hankins, FNP-C.

## 2019-02-26 ENCOUNTER — Encounter (INDEPENDENT_AMBULATORY_CARE_PROVIDER_SITE_OTHER): Payer: Self-pay | Admitting: Family Medicine

## 2019-03-17 ENCOUNTER — Encounter (INDEPENDENT_AMBULATORY_CARE_PROVIDER_SITE_OTHER): Payer: Self-pay | Admitting: Family Medicine

## 2019-03-17 ENCOUNTER — Other Ambulatory Visit: Payer: Self-pay

## 2019-03-17 ENCOUNTER — Ambulatory Visit (INDEPENDENT_AMBULATORY_CARE_PROVIDER_SITE_OTHER): Payer: 59 | Admitting: Family Medicine

## 2019-03-17 DIAGNOSIS — E559 Vitamin D deficiency, unspecified: Secondary | ICD-10-CM

## 2019-03-17 DIAGNOSIS — Z6832 Body mass index (BMI) 32.0-32.9, adult: Secondary | ICD-10-CM

## 2019-03-17 DIAGNOSIS — E669 Obesity, unspecified: Secondary | ICD-10-CM

## 2019-03-17 MED ORDER — VITAMIN D (ERGOCALCIFEROL) 1.25 MG (50000 UNIT) PO CAPS: 50000.0000 [IU] | ORAL_CAPSULE | ORAL | 0 refills | Status: DC

## 2019-03-17 MED FILL — VIT D2 1.25 MG (50,000 UNIT: 1.25 MG | 28 days supply | Qty: 4 | Fill #0

## 2019-03-17 NOTE — Progress Notes (Signed)
Office: [phone]  /  Fax: [phone] TeleHealth Visit:  [name] has verbally consented to this TeleHealth visit today. The patient is located at home, the provider is located at the [facility] office. The participants in this visit include the listed provider and patient. The visit was conducted today via FaceTime.  HPI:   Chief Complaint: OBESITY [name] is here to discuss her progress with her obesity treatment plan. She is keeping a food journal with 1300-1400 calories and 98 grams of protein daily and is following her eating plan approximately 10% of the time. She states she is doing cardio and strength training 30-45 minutes 5-6 days per week. 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 She reports being off the plan due to the recent COVID crisis. She denies polyphagia. She is cooking more at home and is not tracking consistently. She has also been baking treats at home and eating Easter candy. We were unable to weigh the patient today for this TeleHealth visit. She feels as if she has gained 2 lbs since her last visit. She has lost 4 lbs since starting treatment with [place].  Vitamin D deficiency [name] has a diagnosis of Vitamin D deficiency, which is not at goal. Her last Vitamin D level was reported to be 31.9 on 10/16/2018. She is currently taking prescription Vit D and denies nausea, vomiting or muscle weakness.  ASSESSMENT AND PLAN:  Vitamin D deficiency - Plan: Vitamin D, Ergocalciferol, (DRISDOL) 1.25 MG (50000 UT) CAPS capsule  Class 1 obesity with serious comorbidity and body mass index (BMI) of 32.0 to 32.9 in adult, unspecified obesity type  PLAN:  Vitamin D Deficiency [name] was informed that low Vitamin D levels contributes to fatigue and are associated with obesity, breast, and colon cancer. She agrees to  continue to take prescription Vit D @ 50,000 IU every week #4 with 0 refills and will follow-up for routine testing of Vitamin D in 4-6 months. She was informed of the risk of over-replacement of Vitamin D and agrees to not increase her dose unless she discusses this with [place] first. [name] agrees to follow-up with our clinic in 2 weeks.  Obesity [name] is currently in the action stage of change. As such, her goal is to continue with weight loss efforts She has agreed to keep a food journal with 1400-1500 calories and 90 grams of protein daily. She will try to journal at least 5 out of 7 days per week. [name] has been instructed to continue her current exercise regimen for weight loss and overall health benefits. We discussed the following Behavioral Modification Strategies today: increasing lean protein intake, decreasing simple carbohydrates, planning for success, and keep a strict food journal.  [name] has agreed to follow-up with our clinic in 2 weeks. She was informed of the importance of frequent follow-up visits to maximize her success with intensive lifestyle modifications for her multiple health conditions.  ALLERGIES: No Known Allergies  MEDICATIONS: Current Outpatient Medications on File Prior to Visit  Medication Sig Dispense Refill  . cyclobenzaprine (FLEXERIL) 10 MG tablet Take 10 mg by mouth 3 (three) times daily as needed for muscle spasms.     No current facility-administered medications on file prior to visit.     PAST MEDICAL HISTORY: Past Medical History:  Diagnosis Date  . Anemia   .  Back pain   . Depression    followed by [name]  . Gallbladder problem   . GERD (gastroesophageal reflux disease)   . History of kidney problems    1975, eval by 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 Kidney problem   . Migraines    [facility]  . Palpitations   . Vitamin D deficiency     PAST SURGICAL HISTORY: Past Surgical History:  Procedure Laterality Date  . CESAREAN SECTION  2007 & 2009  . CHOLECYSTECTOMY  2009  . CYSTOSCOPY  age 89  . SHOULDER ARTHROSCOPY WITH SUBACROMIAL DECOMPRESSION, ROTATOR CUFF REPAIR AND BICEP TENDON REPAIR Right 02/11/2016   Procedure: RIGHT SHOULDER ARTHROSCOPY WITH SUBACROMIAL DECOMPRESSION, OPEN DCR,  OPEN MINI ROTATOR CUFF REPAIR, LABRAL REPAIR;  Surgeon: Netta Cedars, MD;  Location: [facility];  Service: Orthopedics;  Laterality: Right;  . TUBAL LIGATION    . uterine ablation      SOCIAL HISTORY: Social History   Tobacco Use  . Smoking status: Former Research scientist (life sciences)  . Smokeless tobacco: Never Used  Substance Use Topics  . Alcohol use: Yes    Comment: rare  . Drug use: No    FAMILY HISTORY: Family History  Problem Relation Age of Onset  . Hyperlipidemia Mother   . Depression Mother   . Cancer Mother   . Anxiety disorder Mother   . Obesity Mother   . Migraines Father   . Hypertension Father   . Hyperlipidemia Father   . Cancer Father   . Bipolar disorder Sister   . Diabetes Paternal Grandfather   . Heart disease Paternal Grandfather    ROS: Review of Systems  Gastrointestinal: Negative for nausea and vomiting.  Musculoskeletal:       Negative for muscle weakness.   PHYSICAL EXAM: Pt in no acute distress  RECENT LABS AND TESTS: BMET    Component Value Date/Time   NA 145 (H) 10/16/2018 1150   K 4.2 10/16/2018 1150   CL 102 10/16/2018 1150   CO2 24 10/16/2018 1150   GLUCOSE 78 10/16/2018 1150   GLUCOSE 98 02/01/2016 1129   BUN 12 10/16/2018 1150   CREATININE 0.80 10/16/2018 1150   CALCIUM 9.6 10/16/2018 1150   GFRNONAA 89 10/16/2018 1150   GFRAA 102 10/16/2018 1150   Lab Results  Component Value Date   HGBA1C 5.0 10/16/2018   Lab Results  Component Value Date   INSULIN 3.6 10/16/2018   CBC     Component Value Date/Time   WBC 5.8 10/16/2018 1150   WBC 9.0 02/01/2016 1129   RBC 4.16 10/16/2018 1150   RBC 4.41 02/01/2016 1129   HGB 13.1 10/16/2018 1150   HCT 38.3 10/16/2018 1150   PLT 200 02/01/2016 1129   MCV 92 10/16/2018 1150   MCH 31.5 10/16/2018 1150   MCH 31.1 02/01/2016 1129   MCHC 34.2 10/16/2018 1150   MCHC 33.9 02/01/2016 1129   RDW 11.8 (L) 10/16/2018 1150   LYMPHSABS 2.0 10/16/2018 1150   MONOABS 0.3 10/12/2008 0830   EOSABS 0.0 10/16/2018 1150   BASOSABS 0.0 10/16/2018 1150   Iron/TIBC/Ferritin/ %Sat No results found for: IRON, TIBC, FERRITIN, IRONPCTSAT Lipid Panel     Component Value Date/Time   CHOL 159 10/16/2018 1150   TRIG 47 10/16/2018 1150  HDL 55 10/16/2018 1150   LDLCALC 95 10/16/2018 1150   Hepatic Function Panel     Component Value Date/Time   PROT 7.3 10/16/2018 1150   ALBUMIN 4.5 10/16/2018 1150   AST 33 10/16/2018 1150   ALT 26 10/16/2018 1150   ALKPHOS 56 10/16/2018 1150   BILITOT 0.3 10/16/2018 1150   BILIDIR 0.1 05/05/2008 0114   IBILI 0.5 05/05/2008 0114      Component Value Date/Time   TSH 2.160 10/16/2018 1150   Results for BLEN, RANSOME (MRN [id number]) as of 03/17/2019 16:30  Ref. Range 10/16/2018 11:50  Vitamin D, 25-Hydroxy Latest Ref Range: 30.0 - 100.0 ng/mL 31.9   I, Michaelene Song, am acting as Location manager for [employer], FNP-C.  I have reviewed the above documentation for accuracy and completeness, and I agree with the above.  - Christophor Eick, FNP-C.

## 2019-03-18 ENCOUNTER — Encounter (INDEPENDENT_AMBULATORY_CARE_PROVIDER_SITE_OTHER): Payer: Self-pay | Admitting: Family Medicine

## 2019-03-31 ENCOUNTER — Other Ambulatory Visit: Payer: Self-pay

## 2019-03-31 ENCOUNTER — Encounter (INDEPENDENT_AMBULATORY_CARE_PROVIDER_SITE_OTHER): Payer: Self-pay | Admitting: Family Medicine

## 2019-03-31 ENCOUNTER — Ambulatory Visit (INDEPENDENT_AMBULATORY_CARE_PROVIDER_SITE_OTHER): Payer: 59 | Admitting: Family Medicine

## 2019-03-31 DIAGNOSIS — E559 Vitamin D deficiency, unspecified: Secondary | ICD-10-CM

## 2019-03-31 DIAGNOSIS — E669 Obesity, unspecified: Secondary | ICD-10-CM

## 2019-03-31 DIAGNOSIS — Z6832 Body mass index (BMI) 32.0-32.9, adult: Secondary | ICD-10-CM

## 2019-04-01 ENCOUNTER — Encounter (INDEPENDENT_AMBULATORY_CARE_PROVIDER_SITE_OTHER): Payer: Self-pay | Admitting: Family Medicine

## 2019-04-01 NOTE — Progress Notes (Signed)
Office: [phone]  /  Fax: [phone] TeleHealth Visit:  [name] has verbally consented to this TeleHealth visit today. The patient is located at home, the provider is located at the [facility] office. The participants in this visit include the listed provider and patient. The visit was conducted today via FaceTime.  HPI:   Chief Complaint: OBESITY [name] is here to discuss her progress with her obesity treatment plan. She is keeping a food journal with 1400-1500 calories and 90 grams of protein daily and is following her eating plan approximately 50% of the time. She states she has been doing [employer] 35-40 minutes 6 times per week. [name] states she is journaling 7 days a week and is hitting her protein goal. She does report going over on her calories half the time by about 100-200 calories. She states she is not baking sweets and Easter candy is now gone so her intake of simple carbs has decreased.. She is happy with maintaining her weight. We were unable to weigh the patient today for this TeleHealth visit. She feels as if she has maintained her weight since her last visit. She has lost 4 lbs since starting treatment with [place].  Vitamin D deficiency [name] has a diagnosis of Vitamin D deficiency, which is not at goal. Her last Vitamin D level was reported at 31.9 on 10/16/2018. She is currently taking prescription Vit D and denies nausea, vomiting or muscle weakness.  ASSESSMENT AND PLAN:  Vitamin D deficiency  Class 1 obesity with serious comorbidity and body mass index (BMI) of 32.0 to 32.9 in adult, unspecified obesity type  PLAN:  Vitamin D Deficiency [name] was informed that low Vitamin D levels contributes to fatigue and are associated with obesity, breast, and colon cancer. She agrees to continue taking prescription Vit D (no refill needed) and will follow-up for routine testing of Vitamin D in the near future. She was informed of the risk  of over-replacement of Vitamin D and agrees to not increase her dose unless she discusses this with [place] first. [name] agrees to follow-up with our clinic in 2 weeks.  I spent > than 50% of the 15 minute visit on counseling as documented in the note.  Obesity [name] is currently in the action stage of change. As such, her goal is to continue with weight loss efforts. She has agreed to keep a food journal with 1400-1500 calories and 90 grams of  protein daily. [name] has been instructed to continue her current exercise regimen for weight loss and overall health benefits. We discussed the following Behavioral Modification Strategies today: planning for success and keep a strict food journal. [name] has agreed to follow-up with our clinic in 2 weeks. She was informed of the importance of frequent follow-up visits to maximize her success with intensive lifestyle modifications for her multiple health conditions.  ALLERGIES: No Known Allergies  MEDICATIONS: Current Outpatient Medications on File Prior to Visit  Medication Sig Dispense Refill  . cyclobenzaprine (FLEXERIL) 10 MG tablet Take 10 mg by mouth 3 (three) times daily as needed for muscle spasms.    . Vitamin D, Ergocalciferol, (DRISDOL) 1.25 MG (50000 UT) CAPS capsule Take 1 capsule (50,000 Units total) by mouth every 7 (seven) days. 4 capsule 0   No current facility-administered medications on file prior to visit.     PAST MEDICAL HISTORY: Past Medical History:  Diagnosis Date  . Anemia   . Back pain   . Depression  followed by [name]  . Gallbladder problem   . GERD (gastroesophageal reflux disease)   . History of kidney problems    1975, eval by 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 Kidney problem   . Migraines    [facility]  . Palpitations   . Vitamin D deficiency     PAST SURGICAL HISTORY: Past Surgical History:  Procedure Laterality Date  . CESAREAN SECTION  2007 & 2009  . CHOLECYSTECTOMY  2009  . CYSTOSCOPY  age 48  . SHOULDER ARTHROSCOPY WITH SUBACROMIAL DECOMPRESSION, ROTATOR CUFF REPAIR AND BICEP TENDON REPAIR Right 02/11/2016   Procedure: RIGHT SHOULDER ARTHROSCOPY WITH SUBACROMIAL DECOMPRESSION, OPEN DCR,  OPEN MINI ROTATOR CUFF REPAIR, LABRAL REPAIR;  Surgeon: Netta Cedars, MD;  Location: [facility];  Service: Orthopedics;  Laterality: Right;  . TUBAL LIGATION    . uterine ablation      SOCIAL HISTORY: Social History   Tobacco Use  . Smoking status: Former Research scientist (life sciences)  . Smokeless tobacco: Never Used  Substance Use Topics  . Alcohol use: Yes    Comment: rare  . Drug use: No    FAMILY HISTORY: Family History  Problem Relation Age of Onset  . Hyperlipidemia Mother   . Depression Mother   . Cancer Mother   . Anxiety disorder Mother   . Obesity Mother   . Migraines Father   . Hypertension Father   . Hyperlipidemia Father   . Cancer Father   . Bipolar disorder Sister   . Diabetes Paternal Grandfather   . Heart disease Paternal Grandfather    ROS: Review of Systems  Gastrointestinal: Negative for nausea and vomiting.  Musculoskeletal:       Negative for muscle weakness.   PHYSICAL EXAM: Pt in no acute distress  RECENT LABS AND TESTS: BMET    Component Value Date/Time   NA 145 (H) 10/16/2018 1150   K 4.2 10/16/2018 1150   CL 102 10/16/2018 1150   CO2 24 10/16/2018 1150   GLUCOSE 78 10/16/2018 1150   GLUCOSE 98 02/01/2016 1129   BUN 12 10/16/2018 1150   CREATININE 0.80 10/16/2018 1150   CALCIUM 9.6 10/16/2018 1150   GFRNONAA 89 10/16/2018 1150   GFRAA 102 10/16/2018 1150   Lab Results  Component Value Date   HGBA1C 5.0 10/16/2018   Lab Results  Component Value Date   INSULIN 3.6 10/16/2018   CBC    Component Value Date/Time   WBC 5.8 10/16/2018 1150   WBC 9.0  02/01/2016 1129   RBC 4.16 10/16/2018 1150   RBC 4.41 02/01/2016 1129   HGB 13.1 10/16/2018 1150   HCT 38.3 10/16/2018 1150   PLT 200 02/01/2016 1129   MCV 92 10/16/2018 1150   MCH 31.5 10/16/2018 1150   MCH 31.1 02/01/2016 1129   MCHC 34.2 10/16/2018 1150   MCHC 33.9 02/01/2016 1129   RDW 11.8 (L) 10/16/2018 1150   LYMPHSABS 2.0 10/16/2018 1150   MONOABS 0.3 10/12/2008 0830   EOSABS 0.0 10/16/2018 1150   BASOSABS 0.0 10/16/2018 1150   Iron/TIBC/Ferritin/ %Sat No results found for: IRON, TIBC, FERRITIN, IRONPCTSAT Lipid Panel     Component Value Date/Time   CHOL 159 10/16/2018 1150   TRIG 47 10/16/2018 1150   HDL 55 10/16/2018 1150   LDLCALC 95  10/16/2018 1150   Hepatic Function Panel     Component Value Date/Time   PROT 7.3 10/16/2018 1150   ALBUMIN 4.5 10/16/2018 1150   AST 33 10/16/2018 1150   ALT 26 10/16/2018 1150   ALKPHOS 56 10/16/2018 1150   BILITOT 0.3 10/16/2018 1150   BILIDIR 0.1 05/05/2008 0114   IBILI 0.5 05/05/2008 0114      Component Value Date/Time   TSH 2.160 10/16/2018 1150   Results for JAMAL, HASKIN (MRN [id number]) as of 04/01/2019 09:00  Ref. Range 10/16/2018 11:50  Vitamin D, 25-Hydroxy Latest Ref Range: 30.0 - 100.0 ng/mL 31.9    I, Michaelene Song, am acting as Location manager for [employer], FNP-C.  I have reviewed the above documentation for accuracy and completeness, and I agree with the above.  - Azlynn Mitnick, FNP-C.

## 2019-04-14 ENCOUNTER — Ambulatory Visit (INDEPENDENT_AMBULATORY_CARE_PROVIDER_SITE_OTHER): Payer: 59 | Admitting: Family Medicine

## 2019-04-14 ENCOUNTER — Encounter (INDEPENDENT_AMBULATORY_CARE_PROVIDER_SITE_OTHER): Payer: Self-pay | Admitting: Family Medicine

## 2019-04-14 ENCOUNTER — Other Ambulatory Visit: Payer: Self-pay

## 2019-04-14 DIAGNOSIS — E559 Vitamin D deficiency, unspecified: Secondary | ICD-10-CM | POA: Diagnosis not present

## 2019-04-14 DIAGNOSIS — E669 Obesity, unspecified: Secondary | ICD-10-CM

## 2019-04-14 DIAGNOSIS — Z6832 Body mass index (BMI) 32.0-32.9, adult: Secondary | ICD-10-CM

## 2019-04-14 MED ORDER — VITAMIN D (ERGOCALCIFEROL) 1.25 MG (50000 UNIT) PO CAPS: 50000.0000 [IU] | ORAL_CAPSULE | ORAL | 0 refills | Status: DC

## 2019-04-14 MED FILL — VIT D2 1.25 MG (50,000 UNIT: 1.25 MG | 28 days supply | Qty: 4 | Fill #0

## 2019-04-14 NOTE — Progress Notes (Signed)
Office: [phone]  /  Fax: [phone] TeleHealth Visit:  [name] has verbally consented to this TeleHealth visit today. The patient is located at home, the provider is located at the [employer] [facility] office. The participants in this visit include the listed provider and patient. The visit was conducted today via FaceTime.  HPI:   Chief Complaint: OBESITY [name] is here to discuss her progress with her obesity treatment plan. She is keeping a food journal with 1400-1500 calories and 90 grams of protein daily and is following her eating plan approximately 70-75% of the time. She states she is exercising via cardio/strength [employer] 30-40 minutes 6 times per week. [name] states she weighed 187 lbs today reflecting weight maintenance. She denies polyphagia. She reports getting her protein in and journaling fairly consistently. 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  We were unable to weigh the patient today for this TeleHealth visit. She feels as if she has maintained her weight (weighed 187 lbs today) since her last visit. She has lost 4 lbs since starting treatment with [place].  Vitamin D deficiency [name] has a diagnosis of Vitamin D deficiency, which is not at goal. Her last Vitamin D level was reported to be 31.9 on 10/16/2018. She is currently taking prescription Vit D and denies nausea, vomiting or muscle weakness.  ASSESSMENT AND PLAN:  Vitamin D deficiency - Plan: Vitamin D, Ergocalciferol, (DRISDOL) 1.25 MG (50000 UT) CAPS capsule  Class 1 obesity with serious comorbidity and body mass index (BMI) of 32.0 to 32.9 in adult, unspecified obesity type  PLAN:  Vitamin D Deficiency [name] was informed that low Vitamin D levels contributes to fatigue and are associated with obesity, breast, and colon cancer. She agrees to continue to take prescription Vit D @ 50,000 IU every week #4 with 0 refills and will follow-up  for routine testing of Vitamin D in 2 weeks. She was informed of the risk of over-replacement of Vitamin D and agrees to not increase her dose unless she discusses this with [place] first. [name] agrees to follow-up with our clinic in 2 weeks.  I spent > than 50% of the 15 minute visit on counseling as documented in the note.  Obesity [name] is currently in the action stage of change. As such, her goal is to continue with weight loss efforts. She has agreed to keep a food journal with 1400-1500 calories and 90 grams of protein daily. [name] has been instructed to continue her current exercise regimen for weight loss and overall health benefits. We discussed the following Behavioral Modification Strategies today: increasing lean protein intake, planning for success, and keep a strict food journal.  [name] has agreed to follow-up with our clinic in 2 weeks. She was informed of the importance of frequent follow-up visits to maximize her success with intensive lifestyle modifications for her multiple health conditions.  ALLERGIES: No Known Allergies  MEDICATIONS: Current Outpatient Medications on File Prior to Visit  Medication Sig Dispense Refill  . cyclobenzaprine (FLEXERIL) 10 MG tablet Take 10 mg by mouth 3 (three) times daily as needed for muscle spasms.     No current facility-administered medications on file prior to visit.     PAST MEDICAL HISTORY: Past Medical History:  Diagnosis Date  . Anemia   . Back pain   . Depression    followed by [name]  . Gallbladder problem   . GERD (gastroesophageal  reflux disease)   . History of kidney problems    1975, eval by 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 Kidney problem   . Migraines    [facility]  . Palpitations   . Vitamin D  deficiency     PAST SURGICAL HISTORY: Past Surgical History:  Procedure Laterality Date  . CESAREAN SECTION  2007 & 2009  . CHOLECYSTECTOMY  2009  . CYSTOSCOPY  age 53  . SHOULDER ARTHROSCOPY WITH SUBACROMIAL DECOMPRESSION, ROTATOR CUFF REPAIR AND BICEP TENDON REPAIR Right 02/11/2016   Procedure: RIGHT SHOULDER ARTHROSCOPY WITH SUBACROMIAL DECOMPRESSION, OPEN DCR,  OPEN MINI ROTATOR CUFF REPAIR, LABRAL REPAIR;  Surgeon: Netta Cedars, MD;  Location: [facility];  Service: Orthopedics;  Laterality: Right;  . TUBAL LIGATION    . uterine ablation      SOCIAL HISTORY: Social History   Tobacco Use  . Smoking status: Former Research scientist (life sciences)  . Smokeless tobacco: Never Used  Substance Use Topics  . Alcohol use: Yes    Comment: rare  . Drug use: No    FAMILY HISTORY: Family History  Problem Relation Age of Onset  . Hyperlipidemia Mother   . Depression Mother   . Cancer Mother   . Anxiety disorder Mother   . Obesity Mother   . Migraines Father   . Hypertension Father   . Hyperlipidemia Father   . Cancer Father   . Bipolar disorder Sister   . Diabetes Paternal Grandfather   . Heart disease Paternal Grandfather    ROS: Review of Systems  Gastrointestinal: Negative for nausea and vomiting.  Musculoskeletal:       Negative for muscle weakness.   PHYSICAL EXAM: Pt in no acute distress  RECENT LABS AND TESTS: BMET    Component Value Date/Time   NA 145 (H) 10/16/2018 1150   K 4.2 10/16/2018 1150   CL 102 10/16/2018 1150   CO2 24 10/16/2018 1150   GLUCOSE 78 10/16/2018 1150   GLUCOSE 98 02/01/2016 1129   BUN 12 10/16/2018 1150   CREATININE 0.80 10/16/2018 1150   CALCIUM 9.6 10/16/2018 1150   GFRNONAA 89 10/16/2018 1150   GFRAA 102 10/16/2018 1150   Lab Results  Component Value Date   HGBA1C 5.0 10/16/2018   Lab Results  Component Value Date   INSULIN 3.6 10/16/2018   CBC    Component Value Date/Time   WBC 5.8 10/16/2018 1150   WBC 9.0 02/01/2016 1129   RBC 4.16 10/16/2018  1150   RBC 4.41 02/01/2016 1129   HGB 13.1 10/16/2018 1150   HCT 38.3 10/16/2018 1150   PLT 200 02/01/2016 1129   MCV 92 10/16/2018 1150   MCH 31.5 10/16/2018 1150   MCH 31.1 02/01/2016 1129   MCHC 34.2 10/16/2018 1150   MCHC 33.9 02/01/2016 1129   RDW 11.8 (L) 10/16/2018 1150   LYMPHSABS 2.0 10/16/2018 1150   MONOABS 0.3 10/12/2008 0830   EOSABS 0.0 10/16/2018 1150   BASOSABS 0.0 10/16/2018 1150   Iron/TIBC/Ferritin/ %Sat No results found for: IRON, TIBC, FERRITIN, IRONPCTSAT Lipid Panel     Component Value Date/Time   CHOL 159 10/16/2018 1150   TRIG 47 10/16/2018 1150   HDL 55 10/16/2018 1150   LDLCALC 95 10/16/2018 1150   Hepatic Function Panel     Component Value  Date/Time   PROT 7.3 10/16/2018 1150   ALBUMIN 4.5 10/16/2018 1150   AST 33 10/16/2018 1150   ALT 26 10/16/2018 1150   ALKPHOS 56 10/16/2018 1150   BILITOT 0.3 10/16/2018 1150   BILIDIR 0.1 05/05/2008 0114   IBILI 0.5 05/05/2008 0114      Component Value Date/Time   TSH 2.160 10/16/2018 1150   Results for NEKIA, MAXHAM (MRN [id number]) as of 04/14/2019 14:57  Ref. Range 10/16/2018 11:50  Vitamin D, 25-Hydroxy Latest Ref Range: 30.0 - 100.0 ng/mL 31.9    I, Michaelene Song, am acting as Location manager for [employer], FNP-C.  I have reviewed the above documentation for accuracy and completeness, and I agree with the above.  - Simona Rocque, FNP-C.

## 2019-04-15 ENCOUNTER — Encounter (INDEPENDENT_AMBULATORY_CARE_PROVIDER_SITE_OTHER): Payer: Self-pay | Admitting: Family Medicine

## 2019-04-26 ENCOUNTER — Telehealth: Payer: Self-pay | Admitting: Nurse Practitioner

## 2019-04-26 DIAGNOSIS — R300 Dysuria: Secondary | ICD-10-CM

## 2019-04-26 NOTE — Progress Notes (Signed)
Based on what you shared with me it looks like you have urinary tract infection or pyelonephritis due to associating back pain,that should be evaluated in a face to face office visit. You will need to have urinalysis and urine culture for proper treatment.   NOTE: If you entered your credit card information for this eVisit, you will not be charged. You may see a "hold" on your card for the $30 but that hold will drop off and you will not have a charge processed.  If you are having a true medical emergency please call 911.  If you need an urgent face to face visit, [facility] has four urgent care centers for your convenience.  If you need care fast and have a high deductible or no insurance consider:   [email or web address] to reserve your spot online an avoid wait times  [facility] [address] [place], [place] [address] 8 am to 8 pm Monday-Friday 10 am to 4 pm Saturday-Sunday *Across the street from [employer]  [place], [address] 8 am to 5 pm Monday-Friday * In the [facility] on the [facility]   The following sites will take your  insurance:  . [facility] [place] a Provider at this Location  [name] [place], [place] [address] . 10 am to 8 pm Monday-Friday . 12 pm to 8 pm Saturday-Sunday   . [facility] at [place] a Provider at this Location  [address], [place], [place] [address] . 8 am to 8 pm Monday-Friday . 9 am to 6 pm Saturday . 11 am to 6 pm Sunday   . [facility] at [place] Get Driving Directions  [address] [place], [place] [address] . 8 am to 8 pm Monday-Friday . 8 am to 4 pm Saturday-Sunday   Your e-visit answers were reviewed by a board certified advanced clinical  practitioner to complete your personal care plan.

## 2019-04-28 ENCOUNTER — Ambulatory Visit (INDEPENDENT_AMBULATORY_CARE_PROVIDER_SITE_OTHER): Payer: 59 | Admitting: Family Medicine

## 2019-04-30 DIAGNOSIS — R300 Dysuria: Secondary | ICD-10-CM | POA: Diagnosis not present

## 2019-04-30 MED FILL — SULFAMETHOXAZOLE-TMP DS TAB: 800-160 | 3 days supply | Qty: 6 | Fill #0

## 2019-05-01 ENCOUNTER — Other Ambulatory Visit: Payer: Self-pay

## 2019-05-01 ENCOUNTER — Ambulatory Visit (INDEPENDENT_AMBULATORY_CARE_PROVIDER_SITE_OTHER): Payer: 59 | Admitting: Family Medicine

## 2019-05-01 VITALS — BP 96/62 | HR 78 | Temp 97.9°F | Ht 64.0 in | Wt 186.0 lb

## 2019-05-01 DIAGNOSIS — E669 Obesity, unspecified: Secondary | ICD-10-CM

## 2019-05-01 DIAGNOSIS — Z6832 Body mass index (BMI) 32.0-32.9, adult: Secondary | ICD-10-CM

## 2019-05-01 DIAGNOSIS — E559 Vitamin D deficiency, unspecified: Secondary | ICD-10-CM | POA: Diagnosis not present

## 2019-05-01 NOTE — Progress Notes (Signed)
Office: [phone]  /  Fax: [phone]   HPI:   Chief Complaint: OBESITY [name] is here to discuss her progress with her obesity treatment plan. She is keeping a food journal with 1400-1500 calories and 90 grams of protein and is following her eating plan approximately 50% of the time. She states she is doing [employer] workout 30-35 minutes 6 times per week. [name] is not logging consistently - about 2-3 days a week. She feels she is inconsistent with her food intake (some days eating too much and some days too little) and does not always get her protein in.  Her weight is 186 lb (84.4 kg) today and has had a weight loss of 1 pound over a period of 12 weeks since her last visit. She has lost 5 lbs since starting treatment with [place].  Vitamin D deficiency [name] has a diagnosis of Vitamin D deficiency, which is not at goal. Her last Vitamin level was reported to be 31.9 on 10/16/2018. She is currently taking prescription Vit D and denies nausea, vomiting or muscle weakness.  ASSESSMENT AND PLAN:  Vitamin D deficiency - Plan: VITAMIN D 25 Hydroxy (Vit-D Deficiency, Fractures)  Class 1 obesity with serious comorbidity and body mass index (BMI) of 32.0 to 32.9 in adult, unspecified obesity type  PLAN:  Vitamin D Deficiency [name] was informed that low Vitamin D levels contributes to fatigue and are associated with obesity, breast, and colon cancer. She agrees to continue taking prescription Vit D and will have routine testing of Vitamin D. She was informed of the risk of over-replacement of Vitamin D and agrees to not increase her dose unless she discusses this with [place] first. [name] agrees to follow-up with our clinic in 2-3 weeks.  Obesity [name] is currently in the action stage of change. As such, her goal is to continue with weight loss efforts. She has agreed to keep a food journal with 1400-1500 calories and 90 grams of protein daily. She will log 5 days a week.  [name] has been instructed to continue her current exercise regimen for weight loss and overall health benefits. We discussed the following Behavioral Modification Strategies today: increasing lean protein intake and journaling consistently.[name] has agreed to follow-up with our clinic in 2-3 weeks. She was informed of the importance of frequent follow-up visits to maximize her success with intensive lifestyle modifications for her multiple health conditions.  ALLERGIES: No Known Allergies  MEDICATIONS: Current Outpatient Medications on File Prior to Visit  Medication Sig Dispense Refill  . cyclobenzaprine (FLEXERIL) 10 MG tablet Take 10 mg by mouth 3 (three) times daily as needed for muscle spasms.    . Vitamin D, Ergocalciferol, (DRISDOL) 1.25 MG (50000 UT) CAPS capsule Take 1 capsule (50,000 Units total) by mouth every 7 (seven) days. 4 capsule 0   No current facility-administered medications on file prior to visit.     PAST MEDICAL HISTORY: Past Medical History:  Diagnosis Date  . Anemia   . Back pain   . Depression    followed by [name]  . Gallbladder problem   . GERD (gastroesophageal reflux disease)   . History of kidney problems    1975, eval by 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 Kidney problem   . Migraines    [facility]  . Palpitations   . Vitamin D deficiency     PAST SURGICAL HISTORY: Past Surgical History:  Procedure Laterality Date  . CESAREAN SECTION  2007 & 2009  . CHOLECYSTECTOMY  2009  . CYSTOSCOPY  age 59  . SHOULDER ARTHROSCOPY WITH SUBACROMIAL DECOMPRESSION, ROTATOR CUFF REPAIR AND BICEP TENDON REPAIR Right 02/11/2016   Procedure: RIGHT SHOULDER ARTHROSCOPY WITH SUBACROMIAL DECOMPRESSION, OPEN DCR,  OPEN MINI ROTATOR CUFF REPAIR, LABRAL  REPAIR;  Surgeon: Netta Cedars, MD;  Location: [facility];  Service: Orthopedics;  Laterality: Right;  . TUBAL LIGATION    . uterine ablation      SOCIAL HISTORY: Social History   Tobacco Use  . Smoking status: Former Research scientist (life sciences)  . Smokeless tobacco: Never Used  Substance Use Topics  . Alcohol use: Yes    Comment: rare  . Drug use: No    FAMILY HISTORY: Family History  Problem Relation Age of Onset  . Hyperlipidemia Mother   . Depression Mother   . Cancer Mother   . Anxiety disorder Mother   . Obesity Mother   . Migraines Father   . Hypertension Father   . Hyperlipidemia Father   . Cancer Father   . Bipolar disorder Sister   . Diabetes Paternal Grandfather   . Heart disease Paternal Grandfather    ROS: Review of Systems  Gastrointestinal: Negative for nausea and vomiting.  Musculoskeletal:       Negative for muscle weakness.   PHYSICAL EXAM: Blood pressure 96/62, pulse 78, temperature 97.9 F (36.6 C), height 5\' 4"  (1.626 m), weight 186 lb (84.4 kg), last menstrual period 04/06/2019, SpO2 97 %. Body mass index is 31.93 kg/m. Physical Exam Vitals signs reviewed.  Constitutional:      Appearance: Normal appearance. She is obese.  Cardiovascular:     Rate and Rhythm: Normal rate.     Pulses: Normal pulses.  Pulmonary:     Effort: Pulmonary effort is normal.     Breath sounds: Normal breath sounds.  Musculoskeletal: Normal range of motion.  Skin:    General: Skin is warm and dry.  Neurological:     Mental Status: She is alert and oriented to person, place, and time.  Psychiatric:        Behavior: Behavior normal.   RECENT LABS AND TESTS: BMET    Component Value Date/Time   NA 145 (H) 10/16/2018 1150   K 4.2 10/16/2018 1150   CL 102 10/16/2018 1150   CO2 24 10/16/2018 1150   GLUCOSE 78 10/16/2018 1150   GLUCOSE 98 02/01/2016 1129   BUN 12 10/16/2018 1150   CREATININE 0.80 10/16/2018 1150   CALCIUM 9.6 10/16/2018 1150   GFRNONAA 89 10/16/2018 1150   GFRAA  102 10/16/2018 1150   Lab Results  Component Value Date   HGBA1C 5.0 10/16/2018   Lab Results  Component Value Date   INSULIN 3.6 10/16/2018   CBC    Component Value Date/Time   WBC 5.8 10/16/2018 1150   WBC 9.0 02/01/2016 1129   RBC 4.16 10/16/2018 1150   RBC 4.41 02/01/2016 1129   HGB 13.1 10/16/2018 1150   HCT 38.3 10/16/2018 1150   PLT 200 02/01/2016 1129   MCV 92 10/16/2018 1150   MCH 31.5 10/16/2018 1150   MCH 31.1 02/01/2016 1129   MCHC 34.2 10/16/2018 1150   MCHC 33.9 02/01/2016 1129   RDW 11.8 (L) 10/16/2018 1150   LYMPHSABS 2.0 10/16/2018 1150   MONOABS 0.3 10/12/2008 0830  EOSABS 0.0 10/16/2018 1150   BASOSABS 0.0 10/16/2018 1150   Iron/TIBC/Ferritin/ %Sat No results found for: IRON, TIBC, FERRITIN, IRONPCTSAT Lipid Panel     Component Value Date/Time   CHOL 159 10/16/2018 1150   TRIG 47 10/16/2018 1150   HDL 55 10/16/2018 1150   LDLCALC 95 10/16/2018 1150   Hepatic Function Panel     Component Value Date/Time   PROT 7.3 10/16/2018 1150   ALBUMIN 4.5 10/16/2018 1150   AST 33 10/16/2018 1150   ALT 26 10/16/2018 1150   ALKPHOS 56 10/16/2018 1150   BILITOT 0.3 10/16/2018 1150   BILIDIR 0.1 05/05/2008 0114   IBILI 0.5 05/05/2008 0114      Component Value Date/Time   TSH 2.160 10/16/2018 1150   Results for KEITH, CANCIO (MRN [id number]) as of 05/01/2019 16:25  Ref. Range 10/16/2018 11:50  Vitamin D, 25-Hydroxy Latest Ref Range: 30.0 - 100.0 ng/mL 31.9    OBESITY BEHAVIORAL INTERVENTION VISIT  Today's visit was #13   Starting weight: 191 lbs Starting date: 10/16/2018 Today's weight: 186 lbs Today's date: 05/01/2019 Total lbs lost to date: 5  ASK: We discussed the diagnosis of obesity with [name] today and [name] agreed to give [place] permission to discuss obesity behavioral modification therapy today.  ASSESS: [name] has the diagnosis of obesity and her BMI today is 32.0. Jerrilyn is in the action stage of change.   ADVISE:  [name] was educated on the multiple health risks of obesity as well as the benefit of weight loss to improve her health. She was advised of the need for long term treatment and the importance of lifestyle modifications to improve her current health and to decrease her risk of future health problems.  AGREE: Multiple dietary modification options and treatment options were discussed and  [name] agreed to follow the recommendations documented in the above note.  ARRANGE: [name] was educated on the importance of frequent visits to treat obesity as outlined per CMS and USPSTF guidelines and agreed to schedule her next follow up appointment today.  IMichaelene Coleman, am acting as Location manager for [employer], FNP-C.  I have reviewed the above documentation for accuracy and completeness, and I agree with the above.  - Sheila Capelli, FNP-C.

## 2019-05-05 ENCOUNTER — Encounter (INDEPENDENT_AMBULATORY_CARE_PROVIDER_SITE_OTHER): Payer: Self-pay | Admitting: Family Medicine

## 2019-05-05 DIAGNOSIS — E559 Vitamin D deficiency, unspecified: Secondary | ICD-10-CM | POA: Diagnosis not present

## 2019-05-06 LAB — VITAMIN D 25 HYDROXY (VIT D DEFICIENCY, FRACTURES): Vit D, 25-Hydroxy: 46.9 ng/mL (ref 30.0–100.0)

## 2019-05-19 ENCOUNTER — Ambulatory Visit (INDEPENDENT_AMBULATORY_CARE_PROVIDER_SITE_OTHER): Payer: 59 | Admitting: Family Medicine

## 2019-05-19 ENCOUNTER — Encounter (INDEPENDENT_AMBULATORY_CARE_PROVIDER_SITE_OTHER): Payer: Self-pay | Admitting: Family Medicine

## 2019-05-19 ENCOUNTER — Other Ambulatory Visit: Payer: Self-pay

## 2019-05-19 VITALS — BP 93/60 | HR 70 | Temp 98.0°F | Ht 64.0 in | Wt 186.0 lb

## 2019-05-19 DIAGNOSIS — E669 Obesity, unspecified: Secondary | ICD-10-CM

## 2019-05-19 DIAGNOSIS — F3289 Other specified depressive episodes: Secondary | ICD-10-CM | POA: Diagnosis not present

## 2019-05-19 DIAGNOSIS — Z6831 Body mass index (BMI) 31.0-31.9, adult: Secondary | ICD-10-CM | POA: Diagnosis not present

## 2019-05-19 DIAGNOSIS — Z9189 Other specified personal risk factors, not elsewhere classified: Secondary | ICD-10-CM | POA: Diagnosis not present

## 2019-05-19 DIAGNOSIS — E559 Vitamin D deficiency, unspecified: Secondary | ICD-10-CM

## 2019-05-19 MED ORDER — BUPROPION HCL ER (SR) 150 MG PO TB12: 150.0000 mg | ORAL_TABLET | Freq: Every day | ORAL | 0 refills | Status: DC

## 2019-05-19 MED FILL — BUPROPION HCL SR 150 MG TAB: 150 | 30 days supply | Qty: 30 | Fill #0

## 2019-05-20 NOTE — Progress Notes (Signed)
Office: [phone]  /  Fax: [phone]   HPI:   Chief Complaint: OBESITY [name] is here to discuss her progress with her obesity treatment plan. She is on the keep a food journal with 1400 to 1500 calories and 90 grams of protein daily plan and is following her eating plan approximately 85 to 90 % of the time. She states she is doing [employer] workout  35 to 45 minutes 5 times per week. [name] has cut back on alcohol. She is tracking consistently. She is not measuring her food. She is hitting her protein goals.Her weight is 186 lb (84.4 kg) today and she has not lost weight since her last visit. She has lost 5 lbs since starting treatment with [place].  Vitamin D deficiency [name] has a diagnosis of vitamin D deficiency. Her last vitamin D level was at 46.9 on 10/16/18. She is currently taking vit D and denies nausea, vomiting or muscle weakness.  At risk for osteopenia and osteoporosis [name] is at higher risk of osteopenia and osteoporosis due to vitamin D deficiency.   Depression with emotional eating behaviors (new) [name] is struggling with emotional eating and using food for comfort to the extent that it is negatively impacting her health. She has cravings at night a few nights a week. [name] sometimes feels she is out of control and then feels guilty that she made poor food choices. She is attempting to work on behavior modification techniques to help reduce her emotional eating. She shows no sign of suicidal or homicidal ideations.  ASSESSMENT AND PLAN:    ICD-10-CM   1. Vitamin D deficiency  E55.9   2. Other depression  F32.89 buPROPion (WELLBUTRIN SR) 150 MG 12 hr tablet   with emotional eating   3. At risk for osteoporosis  Z91.89   4. Class 1 obesity with serious comorbidity and body mass index (BMI) of 31.0 to 31.9 in adult, unspecified obesity type  E66.9    Z68.31    PLAN:  Vitamin D Deficiency [name] was informed that low vitamin D levels contributes to  fatigue and are associated with obesity, breast, and colon cancer. She agrees to discontinue prescription Vit D @50 ,000 IU every week and begin OTC vitamin D3 2,000 IU daily. She will follow up for routine testing of vitamin D, at least 2-3 times per year. She was informed of the risk of over-replacement of vitamin D and agrees to not increase her dose unless she discusses this with [place] first. [name] agrees to follow up as directed.  At risk for osteopenia and osteoporosis [name] was given extended  (15 minutes) osteoporosis prevention counseling today. Azarria is at risk for osteopenia and osteoporosis due to her vitamin D deficiency. She was encouraged to take her vitamin D and follow her higher calcium diet and increase strengthening exercise to help strengthen her bones and decrease her risk of osteopenia and osteoporosis.  Depression with Emotional Eating Behaviors (new) We discussed behavior modification techniques today to help [name] deal with her emotional eating and depression. She has agreed to take Bupropion SR 150 mg qAM #30 with no refills and follow up as directed.  Obesity [name] is currently in the action stage of change. As such, her goal is to continue with weight loss efforts She has agreed to keep a food journal with 1400 to 1500 calories and 85 to 90 grams of protein  [name] has been instructed to work up to a goal of 150 minutes of combined cardio and  strengthening exercise per week for weight loss and overall health benefits. We discussed the following Behavioral Modification Strategies today: planning for success, keep a strict food journal and decreasing simple carbohydrates  [name] will measure her food and track consistently.  [name] has agreed to follow up with our clinic in 2 to 3 weeks. She was informed of the importance of frequent follow up visits to maximize her success with intensive lifestyle modifications for her multiple health conditions.  ALLERGIES:  No Known Allergies  MEDICATIONS: Current Outpatient Medications on File Prior to Visit  Medication Sig Dispense Refill  . Vitamin D, Cholecalciferol, 50 MCG (2000 UT) CAPS Take 1 capsule by mouth daily.     No current facility-administered medications on file prior to visit.     PAST MEDICAL HISTORY: Past Medical History:  Diagnosis Date  . Anemia   . Back pain   . Depression    followed by [name]  . Gallbladder problem   . GERD (gastroesophageal reflux disease)   . History of kidney problems    1975, eval by 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 Kidney problem   . Migraines    [facility]  . Palpitations   . Vitamin D deficiency     PAST SURGICAL HISTORY: Past Surgical History:  Procedure Laterality Date  . CESAREAN SECTION  2007 & 2009  . CHOLECYSTECTOMY  2009  . CYSTOSCOPY  age 45  . SHOULDER ARTHROSCOPY WITH SUBACROMIAL DECOMPRESSION, ROTATOR CUFF REPAIR AND BICEP TENDON REPAIR Right 02/11/2016   Procedure: RIGHT SHOULDER ARTHROSCOPY WITH SUBACROMIAL DECOMPRESSION, OPEN DCR,  OPEN MINI ROTATOR CUFF REPAIR, LABRAL REPAIR;  Surgeon: Netta Cedars, MD;  Location: [facility];  Service: Orthopedics;  Laterality: Right;  . TUBAL LIGATION    . uterine ablation      SOCIAL HISTORY: Social History   Tobacco Use  . Smoking status: Former Research scientist (life sciences)  . Smokeless tobacco: Never Used  Substance Use Topics  . Alcohol use: Yes    Comment: rare  . Drug use: No    FAMILY HISTORY: Family History  Problem Relation Age of Onset  . Hyperlipidemia Mother   . Depression Mother   . Cancer Mother   . Anxiety disorder Mother   . Obesity Mother   . Migraines Father   . Hypertension Father   . Hyperlipidemia Father   . Cancer Father   . Bipolar disorder Sister   . Diabetes Paternal  Grandfather   . Heart disease Paternal Grandfather     ROS: Review of Systems  Constitutional: Negative for weight loss.  Gastrointestinal: Negative for nausea and vomiting.  Musculoskeletal:       Negative for muscle weakness  Psychiatric/Behavioral: Positive for depression. Negative for suicidal ideas.    PHYSICAL EXAM: Blood pressure 93/60, pulse 70, temperature 98 F (36.7 C), temperature source Oral, height 5\' 4"  (1.626 m), weight 186 lb (84.4 kg), SpO2 96 %. Body mass index is 31.93 kg/m. Physical Exam Vitals signs reviewed.  Constitutional:      Appearance: Normal appearance. She is well-developed. She is obese.  Cardiovascular:     Rate and Rhythm: Normal rate.  Pulmonary:     Effort: Pulmonary effort is normal.  Musculoskeletal: Normal range of motion.  Skin:    General: Skin is warm and dry.  Neurological:     Mental Status: She is alert and oriented to person, place, and time.  Psychiatric:        Mood and Affect: Mood normal.        Behavior: Behavior normal.        Thought Content: Thought content does not include homicidal or suicidal ideation.     RECENT LABS AND TESTS: BMET    Component Value Date/Time   NA 145 (H) 10/16/2018 1150   K 4.2 10/16/2018 1150   CL 102 10/16/2018 1150   CO2 24 10/16/2018 1150   GLUCOSE 78 10/16/2018 1150   GLUCOSE 98 02/01/2016 1129   BUN 12 10/16/2018 1150   CREATININE 0.80 10/16/2018 1150   CALCIUM 9.6 10/16/2018 1150   GFRNONAA 89 10/16/2018 1150   GFRAA 102 10/16/2018 1150   Lab Results  Component Value Date   HGBA1C 5.0 10/16/2018   Lab Results  Component Value Date   INSULIN 3.6 10/16/2018   CBC    Component Value Date/Time   WBC 5.8 10/16/2018 1150   WBC 9.0 02/01/2016 1129   RBC 4.16 10/16/2018 1150   RBC 4.41 02/01/2016 1129   HGB 13.1 10/16/2018 1150   HCT 38.3 10/16/2018 1150   PLT 200 02/01/2016 1129   MCV 92 10/16/2018 1150   MCH 31.5 10/16/2018 1150   MCH 31.1 02/01/2016 1129   MCHC  34.2 10/16/2018 1150   MCHC 33.9 02/01/2016 1129   RDW 11.8 (L) 10/16/2018 1150   LYMPHSABS 2.0 10/16/2018 1150   MONOABS 0.3 10/12/2008 0830   EOSABS 0.0 10/16/2018 1150   BASOSABS 0.0 10/16/2018 1150   Iron/TIBC/Ferritin/ %Sat No results found for: IRON, TIBC, FERRITIN, IRONPCTSAT Lipid Panel     Component Value Date/Time   CHOL 159 10/16/2018 1150   TRIG 47 10/16/2018 1150   HDL 55 10/16/2018 1150   LDLCALC 95 10/16/2018 1150   Hepatic Function Panel     Component Value Date/Time   PROT 7.3 10/16/2018 1150   ALBUMIN 4.5 10/16/2018 1150   AST 33 10/16/2018 1150   ALT 26 10/16/2018 1150   ALKPHOS 56 10/16/2018 1150   BILITOT 0.3 10/16/2018 1150   BILIDIR 0.1 05/05/2008 0114   IBILI 0.5 05/05/2008 0114      Component Value Date/Time   TSH 2.160 10/16/2018 1150     Ref. Range 05/05/2019 11:28  Vitamin D, 25-Hydroxy Latest Ref Range: 30.0 - 100.0 ng/mL 46.9    OBESITY BEHAVIORAL INTERVENTION VISIT  Today's visit was # 14   Starting weight: 191 lbs Starting date: 10/16/2018 Today's weight : 186 lbs Today's date: 05/19/2019 Total lbs lost to date: 5    05/19/2019  Height 5\' 4"  (1.626 m)  Weight 186 lb (84.4 kg)  BMI (Calculated) 31.91  BLOOD PRESSURE - SYSTOLIC 93  BLOOD PRESSURE - DIASTOLIC 60   Body Fat % 22.2 %  Total Body Water (lbs) 77.4 lbs    ASK: We discussed the diagnosis of obesity with Myrtie Cruise today and Shervon agreed to give [place] permission to discuss obesity behavioral modification therapy today.  ASSESS: Guiliana has the diagnosis of obesity and her BMI today is 31.91 Shandiin is in the action stage of change   ADVISE: Tamia was educated on the multiple health risks of obesity as well as the benefit of weight loss to improve her health. She was advised of the need for long term treatment and the importance of lifestyle modifications to improve her current health and to  decrease her risk of future health problems.  AGREE: Multiple  dietary modification options and treatment options were discussed and  Lanyla agreed to follow the recommendations documented in the above note.  ARRANGE: Joury was educated on the importance of frequent visits to treat obesity as outlined per CMS and USPSTF guidelines and agreed to schedule her next follow up appointment today.  I, Doreene Nest, am acting as transcriptionist for [employer], FNP-C  I have reviewed the above documentation for accuracy and completeness, and I agree with the above.  - Keaja Reaume, FNP-C.

## 2019-05-22 ENCOUNTER — Encounter (INDEPENDENT_AMBULATORY_CARE_PROVIDER_SITE_OTHER): Payer: Self-pay | Admitting: Family Medicine

## 2019-05-22 DIAGNOSIS — F32A Depression, unspecified: Secondary | ICD-10-CM | POA: Insufficient documentation

## 2019-05-22 DIAGNOSIS — F329 Major depressive disorder, single episode, unspecified: Secondary | ICD-10-CM | POA: Insufficient documentation

## 2019-05-23 ENCOUNTER — Other Ambulatory Visit: Payer: Self-pay | Admitting: Internal Medicine

## 2019-05-23 DIAGNOSIS — Z20822 Contact with and (suspected) exposure to covid-19: Secondary | ICD-10-CM

## 2019-05-26 LAB — NOVEL CORONAVIRUS, NAA: SARS-CoV-2, NAA: NOT DETECTED

## 2019-06-12 ENCOUNTER — Encounter (INDEPENDENT_AMBULATORY_CARE_PROVIDER_SITE_OTHER): Payer: Self-pay | Admitting: Bariatrics

## 2019-06-12 ENCOUNTER — Ambulatory Visit (INDEPENDENT_AMBULATORY_CARE_PROVIDER_SITE_OTHER): Payer: 59 | Admitting: Bariatrics

## 2019-06-12 ENCOUNTER — Other Ambulatory Visit: Payer: Self-pay

## 2019-06-12 VITALS — BP 102/67 | HR 69 | Temp 98.4°F | Ht 64.0 in | Wt 185.0 lb

## 2019-06-12 DIAGNOSIS — Z9189 Other specified personal risk factors, not elsewhere classified: Secondary | ICD-10-CM | POA: Diagnosis not present

## 2019-06-12 DIAGNOSIS — Z6831 Body mass index (BMI) 31.0-31.9, adult: Secondary | ICD-10-CM

## 2019-06-12 DIAGNOSIS — E559 Vitamin D deficiency, unspecified: Secondary | ICD-10-CM

## 2019-06-12 DIAGNOSIS — F3289 Other specified depressive episodes: Secondary | ICD-10-CM

## 2019-06-12 DIAGNOSIS — E669 Obesity, unspecified: Secondary | ICD-10-CM | POA: Diagnosis not present

## 2019-06-12 MED ORDER — BUPROPION HCL ER (SR) 150 MG PO TB12: 150.0000 mg | ORAL_TABLET | Freq: Every day | ORAL | 0 refills | Status: DC

## 2019-06-12 MED FILL — BUPROPION HCL SR 150 MG TAB: 150 | 30 days supply | Qty: 30 | Fill #0

## 2019-06-12 NOTE — Progress Notes (Signed)
Office: [phone]  /  Fax: [phone]   HPI:   Chief Complaint: OBESITY [name] is here to discuss her progress with her obesity treatment plan. She is on the keep a food journal with 1400 to 1500 calories and 90 grams of protein daily plan and is following her eating plan approximately 50 % of the time. She states she is doing cardio exercise and strength training 30 to 40 minutes 6 times per week. [name] is down 1 pound. She did really well with the first week. She went to the beach and was more active. [name] is back on track. Her weight is 185 lb (83.9 kg) today and has had a weight loss of 1 pound over a period of 3 weeks since her last visit. She has lost 6 lbs since starting treatment with [place].  Depression with emotional eating behaviors [name] is struggling with emotional eating and using food for comfort to the extent that it is negatively impacting her health. She often snacks when she is not hungry. [name] sometimes feels she is out of control and then feels guilty that she made poor food choices. She has been working on behavior modification techniques to help reduce her emotional eating and has been somewhat successful. She shows no sign of suicidal or homicidal ideations.  Vitamin D deficiency [name] has a diagnosis of vitamin D deficiency. Her last vitamin D level was at 46.9 She is currently taking vit D and denies nausea, vomiting or muscle weakness.  At risk for osteopenia and osteoporosis [name] is at higher risk of osteopenia and osteoporosis due to vitamin D deficiency.   ASSESSMENT AND PLAN:  Vitamin D deficiency  Other depression - Plan: buPROPion (WELLBUTRIN SR) 150 MG 12 hr tablet  At risk for osteoporosis  Class 1 obesity with serious comorbidity and body mass index (BMI) of 31.0 to 31.9 in adult, unspecified obesity type  Other depression - with emotional eating  - Plan: buPROPion (WELLBUTRIN SR) 150 MG 12 hr tablet  PLAN:  Depression with  Emotional Eating Behaviors We discussed behavior modification techniques today to help [name] deal with her emotional eating and depression. She has agreed to continue Wellbutrin SR 150 mg daily #30 with no refills and follow up as directed.  Vitamin D Deficiency [name] was informed that low vitamin D levels contributes to fatigue and are associated with obesity, breast, and colon cancer. She will continue to take OTC Vit D and will follow up for routine testing of vitamin D, at least 2-3 times per year. She was informed of the risk of over-replacement of vitamin D and agrees to not increase her dose unless she discusses this with [place] first.  At risk for osteopenia and osteoporosis [name] was given extended  (15 minutes) osteoporosis prevention counseling today. [name] is at risk for osteopenia and osteoporosis due to her vitamin D deficiency. She was encouraged to take her vitamin D and follow her higher calcium diet and increase strengthening exercise to help strengthen her bones and decrease her risk of osteopenia and osteoporosis.  Obesity [name] is currently in the action stage of change. As such, her goal is to continue with weight loss efforts She has agreed to keep a food journal with 1400 to 1500 calories and 90 grams of protein daily [name] has been instructed to work up to a goal of 150 minutes of combined cardio and strengthening exercise per week for weight loss and overall health benefits. We discussed the following Behavioral Modification Strategies today:  increase H2O intake, no skipping meals, keeping healthy foods in the home, increasing lean protein intake, decreasing simple carbohydrates, increasing vegetables, decrease eating out, no ETOH for 4 weeks (personal goal) and work on meal planning and intentional eating  [name] has agreed to follow up with our clinic in 2 weeks. She was informed of the importance of frequent follow up visits to maximize her success with  intensive lifestyle modifications for her multiple health conditions.  ALLERGIES: No Known Allergies  MEDICATIONS: Current Outpatient Medications on File Prior to Visit  Medication Sig Dispense Refill  . Vitamin D, Cholecalciferol, 50 MCG (2000 UT) CAPS Take 1 capsule by mouth daily.     No current facility-administered medications on file prior to visit.     PAST MEDICAL HISTORY: Past Medical History:  Diagnosis Date  . Anemia   . Back pain   . Depression    followed by [name]  . Gallbladder problem   . GERD (gastroesophageal reflux disease)   . History of kidney problems    1975, eval by 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 Kidney problem   . Migraines    [facility]  . Palpitations   . Vitamin D deficiency     PAST SURGICAL HISTORY: Past Surgical History:  Procedure Laterality Date  . CESAREAN SECTION  2007 & 2009  . CHOLECYSTECTOMY  2009  . CYSTOSCOPY  age 89  . SHOULDER ARTHROSCOPY WITH SUBACROMIAL DECOMPRESSION, ROTATOR CUFF REPAIR AND BICEP TENDON REPAIR Right 02/11/2016   Procedure: RIGHT SHOULDER ARTHROSCOPY WITH SUBACROMIAL DECOMPRESSION, OPEN DCR,  OPEN MINI ROTATOR CUFF REPAIR, LABRAL REPAIR;  Surgeon: Netta Cedars, MD;  Location: [facility];  Service: Orthopedics;  Laterality: Right;  . TUBAL LIGATION    . uterine ablation      SOCIAL HISTORY: Social History   Tobacco Use  . Smoking status: Former Research scientist (life sciences)  . Smokeless tobacco: Never Used  Substance Use Topics  . Alcohol use: Yes    Comment: rare  . Drug use: No    FAMILY HISTORY: Family History  Problem Relation Age of Onset  . Hyperlipidemia Mother   . Depression Mother   . Cancer Mother   . Anxiety disorder Mother   . Obesity Mother   . Migraines Father   . Hypertension Father   .  Hyperlipidemia Father   . Cancer Father   . Bipolar disorder Sister   . Diabetes Paternal Grandfather   . Heart disease Paternal Grandfather     ROS: Review of Systems  Constitutional: Positive for weight loss.  Gastrointestinal: Negative for nausea and vomiting.  Musculoskeletal:       Negative for muscle weakness  Psychiatric/Behavioral: Positive for depression. Negative for suicidal ideas.    PHYSICAL EXAM: Blood pressure 102/67, pulse 69, temperature 98.4 F (36.9 C), temperature source Other (Comment), height 5\' 4"  (1.626 m), weight 185 lb (83.9 kg), last menstrual period 06/06/2019, SpO2 96 %. Body mass index is 31.76 kg/m. Physical Exam Vitals signs reviewed.  Constitutional:      Appearance: Normal appearance. She is well-developed. She is obese.  Cardiovascular:     Rate and Rhythm: Normal rate.  Pulmonary:     Effort: Pulmonary effort is normal.  Musculoskeletal: Normal range of motion.  Skin:    General: Skin is warm  and dry.  Neurological:     Mental Status: She is alert and oriented to person, place, and time.  Psychiatric:        Mood and Affect: Mood normal.        Behavior: Behavior normal.        Thought Content: Thought content does not include homicidal or suicidal ideation.     RECENT LABS AND TESTS: BMET    Component Value Date/Time   NA 145 (H) 10/16/2018 1150   K 4.2 10/16/2018 1150   CL 102 10/16/2018 1150   CO2 24 10/16/2018 1150   GLUCOSE 78 10/16/2018 1150   GLUCOSE 98 02/01/2016 1129   BUN 12 10/16/2018 1150   CREATININE 0.80 10/16/2018 1150   CALCIUM 9.6 10/16/2018 1150   GFRNONAA 89 10/16/2018 1150   GFRAA 102 10/16/2018 1150   Lab Results  Component Value Date   HGBA1C 5.0 10/16/2018   Lab Results  Component Value Date   INSULIN 3.6 10/16/2018   CBC    Component Value Date/Time   WBC 5.8 10/16/2018 1150   WBC 9.0 02/01/2016 1129   RBC 4.16 10/16/2018 1150   RBC 4.41 02/01/2016 1129   HGB 13.1 10/16/2018 1150   HCT  38.3 10/16/2018 1150   PLT 200 02/01/2016 1129   MCV 92 10/16/2018 1150   MCH 31.5 10/16/2018 1150   MCH 31.1 02/01/2016 1129   MCHC 34.2 10/16/2018 1150   MCHC 33.9 02/01/2016 1129   RDW 11.8 (L) 10/16/2018 1150   LYMPHSABS 2.0 10/16/2018 1150   MONOABS 0.3 10/12/2008 0830   EOSABS 0.0 10/16/2018 1150   BASOSABS 0.0 10/16/2018 1150   Iron/TIBC/Ferritin/ %Sat No results found for: IRON, TIBC, FERRITIN, IRONPCTSAT Lipid Panel     Component Value Date/Time   CHOL 159 10/16/2018 1150   TRIG 47 10/16/2018 1150   HDL 55 10/16/2018 1150   LDLCALC 95 10/16/2018 1150   Hepatic Function Panel     Component Value Date/Time   PROT 7.3 10/16/2018 1150   ALBUMIN 4.5 10/16/2018 1150   AST 33 10/16/2018 1150   ALT 26 10/16/2018 1150   ALKPHOS 56 10/16/2018 1150   BILITOT 0.3 10/16/2018 1150   BILIDIR 0.1 05/05/2008 0114   IBILI 0.5 05/05/2008 0114      Component Value Date/Time   TSH 2.160 10/16/2018 1150     Ref. Range 05/05/2019 11:28  Vitamin D, 25-Hydroxy Latest Ref Range: 30.0 - 100.0 ng/mL 46.9    OBESITY BEHAVIORAL INTERVENTION VISIT  Today's visit was # 15   Starting weight: 191 lbs Starting date: 10/16/2018 Today's weight : 185 lbs Today's date: 06/12/2019 Total lbs lost to date: 6    06/12/2019  Height 5\' 4"  (1.626 m)  Weight 185 lb (83.9 kg)  BMI (Calculated) 31.74  BLOOD PRESSURE - SYSTOLIC 366  BLOOD PRESSURE - DIASTOLIC 67   Body Fat % 44.0 %  Total Body Water (lbs) 78 lbs    ASK: We discussed the diagnosis of obesity with [name] today and [name] agreed to give [place] permission to discuss obesity behavioral modification therapy today.  ASSESS: [name] has the diagnosis of obesity and her BMI today is 31.74 [name] is in the action stage of change   ADVISE: [name] was educated on the multiple health risks of obesity as well as the benefit of weight loss to improve her health. She was advised of the need for long term treatment and the  importance of lifestyle modifications to improve her  current health and to decrease her risk of future health problems.  AGREE: Multiple dietary modification options and treatment options were discussed and  [name] agreed to follow the recommendations documented in the above note.  ARRANGE: [name] was educated on the importance of frequent visits to treat obesity as outlined per CMS and USPSTF guidelines and agreed to schedule her next follow up appointment today.  Corey Skains, am acting as Location manager for [employer]. Owens Shark, DO  I have reviewed the above documentation for accuracy and completeness, and I agree with the above. -Jearld Lesch, DO

## 2019-06-24 DIAGNOSIS — Z1231 Encounter for screening mammogram for malignant neoplasm of breast: Secondary | ICD-10-CM | POA: Diagnosis not present

## 2019-06-24 DIAGNOSIS — Z01419 Encounter for gynecological examination (general) (routine) without abnormal findings: Secondary | ICD-10-CM | POA: Diagnosis not present

## 2019-06-24 DIAGNOSIS — Z1151 Encounter for screening for human papillomavirus (HPV): Secondary | ICD-10-CM | POA: Diagnosis not present

## 2019-06-24 DIAGNOSIS — Z6832 Body mass index (BMI) 32.0-32.9, adult: Secondary | ICD-10-CM | POA: Diagnosis not present

## 2019-06-24 DIAGNOSIS — Z23 Encounter for immunization: Secondary | ICD-10-CM | POA: Diagnosis not present

## 2019-06-26 ENCOUNTER — Other Ambulatory Visit: Payer: Self-pay

## 2019-06-26 ENCOUNTER — Ambulatory Visit (INDEPENDENT_AMBULATORY_CARE_PROVIDER_SITE_OTHER): Payer: 59 | Admitting: Bariatrics

## 2019-06-26 ENCOUNTER — Encounter (INDEPENDENT_AMBULATORY_CARE_PROVIDER_SITE_OTHER): Payer: Self-pay | Admitting: Bariatrics

## 2019-06-26 VITALS — BP 117/75 | HR 80 | Temp 98.4°F | Ht 64.0 in | Wt 182.0 lb

## 2019-06-26 DIAGNOSIS — E559 Vitamin D deficiency, unspecified: Secondary | ICD-10-CM

## 2019-06-26 DIAGNOSIS — E669 Obesity, unspecified: Secondary | ICD-10-CM

## 2019-06-26 DIAGNOSIS — F3289 Other specified depressive episodes: Secondary | ICD-10-CM | POA: Diagnosis not present

## 2019-06-26 DIAGNOSIS — Z6831 Body mass index (BMI) 31.0-31.9, adult: Secondary | ICD-10-CM

## 2019-06-26 NOTE — Progress Notes (Signed)
Office: [phone]  /  Fax: [phone]   HPI:   Chief Complaint: OBESITY [name] is here to discuss her progress with her obesity treatment plan. She is on the  keep a food journal with 1400-1500 calories and 90 grams of protein  and is following her eating plan approximately 90% of the time. She states she is doing strengthening exercises/cardio/walking 30-40 minutes 5-6 times per week. [name] is down 3 lbs. She did not have any alcohol, is journaling, and reports adequate water intake.  Her weight is 182 lb (82.6 kg) today and has had a weight loss of 3 pounds over a period of 2 weeks since her last visit. She has lost 9 lbs since starting treatment with [place].  Vitamin D deficiency [name] has a diagnosis of Vitamin D deficiency. Her last Vitamin D was 46.9 on 05/05/2019. She is currently taking OTC Vit D and denies nausea, vomiting or muscle weakness.  Depression with emotional eating behaviors [name] is struggling with emotional eating and using food for comfort to the extent that it is negatively impacting her health. She often snacks when she is not hungry. [name] sometimes feels she is out of control and then feels guilty that she made poor food choices. She has been working on behavior modification techniques to help reduce her emotional eating and has been somewhat successful. [name] is taking Wellbutrin and is having no side effects. She shows no sign of suicidal or homicidal ideations.  Depression screen [name] 2/9 10/16/2018 03/15/2016 11/15/2015 08/09/2015 06/07/2015  Decreased Interest 1 0 0 0 0  Down, Depressed, Hopeless 1 0 0 0 0  PHQ - 2 Score 2 0 0 0 0  Altered sleeping 0 - - - -  Tired, decreased energy 1 - - - -  Change in appetite 2 - - - -  Feeling bad or failure about yourself  1 - - - -  Trouble concentrating 0 - - - -  Moving slowly or fidgety/restless 0 - - - -  Suicidal thoughts 0 - - - -  PHQ-9 Score 6 - - - -  Difficult doing work/chores Not difficult at  all - - - -   ASSESSMENT AND PLAN:  Vitamin D deficiency  Other depression  Class 1 obesity with serious comorbidity and body mass index (BMI) of 31.0 to 31.9 in adult, unspecified obesity type  PLAN:  Vitamin D Deficiency [name] was informed that low Vitamin D levels contributes to fatigue and are associated with obesity, breast, and colon cancer. She agrees to continue taking OTC Vit D and will follow-up for routine testing of Vitamin D, at least 2-3 times per year. She was informed of the risk of over-replacement of Vitamin D and agrees to not increase her dose unless she discusses this with [place] first. [name] agrees to follow-up with our clinic in 2 weeks.  Depression with Emotional Eating Behaviors We discussed behavior modification techniques today to help [name] deal with her emotional eating and depression. [name] will continue taking Wellbutrin and will follow-up as directed to monitor her progress.  I spent > than 50% of the 15 minute visit on counseling as documented in the note.  Obesity [name] is currently in the action stage of change. As such, her goal is to continue with weight loss efforts. She has agreed to keep a food journal with 1400-1500 calories and 90 grams of protein. [name] will work on meal planning, intentional eating, and will continue to journal. [name] has been instructed  to her current exercise regimen for weight loss and overall health benefits. We discussed the following Behavioral Modification Strategies today: increasing lean protein intake, decreasing simple carbohydrates, increasing vegetables, increase H20 intake, decrease eating out, no skipping meals, work on meal planning and easy cooking plans, and keeping healthy foods in the home.   [name] has agreed to follow-up with our clinic in 2 weeks. She was informed of the importance of frequent follow-up visits to maximize her success with intensive lifestyle modifications for her multiple health  conditions.  ALLERGIES: No Known Allergies  MEDICATIONS: Current Outpatient Medications on File Prior to Visit  Medication Sig Dispense Refill  . buPROPion (WELLBUTRIN SR) 150 MG 12 hr tablet Take 1 tablet (150 mg total) by mouth daily. 30 tablet 0  . Vitamin D, Cholecalciferol, 50 MCG (2000 UT) CAPS Take 1 capsule by mouth daily.     No current facility-administered medications on file prior to visit.     PAST MEDICAL HISTORY: Past Medical History:  Diagnosis Date  . Anemia   . Back pain   . Depression    followed by Dr. Garwin Coleman  . Gallbladder problem   . GERD (gastroesophageal reflux disease)   . History of kidney problems    1975, eval by Dr. 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 Kidney problem   . Migraines    [facility]  . Palpitations   . Vitamin D deficiency     PAST SURGICAL HISTORY: Past Surgical History:  Procedure Laterality Date  . CESAREAN SECTION  2007 & 2009  . CHOLECYSTECTOMY  2009  . CYSTOSCOPY  age 51  . SHOULDER ARTHROSCOPY WITH SUBACROMIAL DECOMPRESSION, ROTATOR CUFF REPAIR AND BICEP TENDON REPAIR Right 02/11/2016   Procedure: RIGHT SHOULDER ARTHROSCOPY WITH SUBACROMIAL DECOMPRESSION, OPEN DCR,  OPEN MINI ROTATOR CUFF REPAIR, LABRAL REPAIR;  Surgeon: Sheila Cedars, MD;  Location: [facility];  Service: Orthopedics;  Laterality: Right;  . TUBAL LIGATION    . uterine ablation      SOCIAL HISTORY: Social History   Tobacco Use  . Smoking status: Former Research scientist (life sciences)  . Smokeless tobacco: Never Used  Substance Use Topics  . Alcohol use: Yes    Comment: rare  . Drug use: No    FAMILY HISTORY: Family History  Problem Relation Age of Onset  . Hyperlipidemia Mother   . Depression Mother   . Cancer Mother   . Anxiety disorder Mother   . Obesity Mother   .  Migraines Father   . Hypertension Father   . Hyperlipidemia Father   . Cancer Father   . Bipolar disorder Sister   . Diabetes Paternal Grandfather   . Heart disease Paternal Grandfather    ROS: Review of Systems  Gastrointestinal: Negative for nausea and vomiting.  Musculoskeletal:       Negative for muscle weakness.  Psychiatric/Behavioral: Positive for depression (emotional eating). Negative for suicidal ideas.       Negative for homicidal ideas.   PHYSICAL EXAM: Blood pressure 117/75, pulse 80, temperature 98.4 F (36.9 C), temperature source Oral, height 5\' 4"  (1.626 m), weight 182 lb (82.6 kg), last menstrual period 06/06/2019, SpO2 96 %. Body mass index is 31.24 kg/m. Physical Exam Vitals signs reviewed.  Constitutional:      Appearance: Normal appearance. She is obese.  Cardiovascular:  Rate and Rhythm: Normal rate.     Pulses: Normal pulses.  Pulmonary:     Effort: Pulmonary effort is normal.     Breath sounds: Normal breath sounds.  Musculoskeletal: Normal range of motion.  Skin:    General: Skin is warm and dry.  Neurological:     Mental Status: She is alert and oriented to person, place, and time.  Psychiatric:        Behavior: Behavior normal.   RECENT LABS AND TESTS: BMET    Component Value Date/Time   NA 145 (H) 10/16/2018 1150   K 4.2 10/16/2018 1150   CL 102 10/16/2018 1150   CO2 24 10/16/2018 1150   GLUCOSE 78 10/16/2018 1150   GLUCOSE 98 02/01/2016 1129   BUN 12 10/16/2018 1150   CREATININE 0.80 10/16/2018 1150   CALCIUM 9.6 10/16/2018 1150   GFRNONAA 89 10/16/2018 1150   GFRAA 102 10/16/2018 1150   Lab Results  Component Value Date   HGBA1C 5.0 10/16/2018   Lab Results  Component Value Date   INSULIN 3.6 10/16/2018   CBC    Component Value Date/Time   WBC 5.8 10/16/2018 1150   WBC 9.0 02/01/2016 1129   RBC 4.16 10/16/2018 1150   RBC 4.41 02/01/2016 1129   HGB 13.1 10/16/2018 1150   HCT 38.3 10/16/2018 1150   PLT 200  02/01/2016 1129   MCV 92 10/16/2018 1150   MCH 31.5 10/16/2018 1150   MCH 31.1 02/01/2016 1129   MCHC 34.2 10/16/2018 1150   MCHC 33.9 02/01/2016 1129   RDW 11.8 (L) 10/16/2018 1150   LYMPHSABS 2.0 10/16/2018 1150   MONOABS 0.3 10/12/2008 0830   EOSABS 0.0 10/16/2018 1150   BASOSABS 0.0 10/16/2018 1150   Iron/TIBC/Ferritin/ %Sat No results found for: IRON, TIBC, FERRITIN, IRONPCTSAT Lipid Panel     Component Value Date/Time   CHOL 159 10/16/2018 1150   TRIG 47 10/16/2018 1150   HDL 55 10/16/2018 1150   LDLCALC 95 10/16/2018 1150   Hepatic Function Panel     Component Value Date/Time   PROT 7.3 10/16/2018 1150   ALBUMIN 4.5 10/16/2018 1150   AST 33 10/16/2018 1150   ALT 26 10/16/2018 1150   ALKPHOS 56 10/16/2018 1150   BILITOT 0.3 10/16/2018 1150   BILIDIR 0.1 05/05/2008 0114   IBILI 0.5 05/05/2008 0114      Component Value Date/Time   TSH 2.160 10/16/2018 1150   Results for JASIE, MELESKI (MRN [id number]) as of 06/26/2019 13:37  Ref. Range 05/05/2019 11:28  Vitamin D, 25-Hydroxy Latest Ref Range: 30.0 - 100.0 ng/mL 46.9   OBESITY BEHAVIORAL INTERVENTION VISIT  Today's visit was #16  Starting weight: 191 lbs Starting date: 10/16/2018 Today's weight: 182 lbs Today's date: 06/26/2019 Total lbs lost to date: 9   06/26/2019  Height 5\' 4"  (1.626 m)  Weight 182 lb (82.6 kg)  BMI (Calculated) 31.22  BLOOD PRESSURE - SYSTOLIC 664  BLOOD PRESSURE - DIASTOLIC 75   Body Fat % 40.3 %  Total Body Water (lbs) 76.2 lbs   ASK: We discussed the diagnosis of obesity with Myrtie Cruise today and Dela agreed to give [place] permission to discuss obesity behavioral modification therapy today.  ASSESS: Jodie has the diagnosis of obesity and her BMI today is 31.4. Pleshette is in the action stage of change.   ADVISE: Jojo was educated on the multiple health risks of obesity as well as the benefit of weight loss to improve her health. She  was advised of the need for long  term treatment and the importance of lifestyle modifications to improve her current health and to decrease her risk of future health problems.  AGREE: Multiple dietary modification options and treatment options were discussed and  Haelyn agreed to follow the recommendations documented in the above note.  ARRANGE: Koriana was educated on the importance of frequent visits to treat obesity as outlined per CMS and USPSTF guidelines and agreed to schedule her next follow up appointment today.  Migdalia Dk, am acting as Location manager for [employer], DO  I have reviewed the above documentation for accuracy and completeness, and I agree with the above. -Jearld Lesch, DO

## 2019-07-10 ENCOUNTER — Ambulatory Visit (INDEPENDENT_AMBULATORY_CARE_PROVIDER_SITE_OTHER): Payer: 59 | Admitting: Family Medicine

## 2019-07-14 ENCOUNTER — Other Ambulatory Visit: Payer: Self-pay

## 2019-07-14 ENCOUNTER — Ambulatory Visit (INDEPENDENT_AMBULATORY_CARE_PROVIDER_SITE_OTHER): Payer: 59 | Admitting: Family Medicine

## 2019-07-14 ENCOUNTER — Encounter (INDEPENDENT_AMBULATORY_CARE_PROVIDER_SITE_OTHER): Payer: Self-pay | Admitting: Family Medicine

## 2019-07-14 VITALS — BP 100/61 | HR 68 | Temp 98.1°F | Ht 64.0 in | Wt 183.0 lb

## 2019-07-14 DIAGNOSIS — F3289 Other specified depressive episodes: Secondary | ICD-10-CM | POA: Diagnosis not present

## 2019-07-14 DIAGNOSIS — Z9189 Other specified personal risk factors, not elsewhere classified: Secondary | ICD-10-CM | POA: Diagnosis not present

## 2019-07-14 DIAGNOSIS — Z6831 Body mass index (BMI) 31.0-31.9, adult: Secondary | ICD-10-CM | POA: Diagnosis not present

## 2019-07-14 DIAGNOSIS — E559 Vitamin D deficiency, unspecified: Secondary | ICD-10-CM | POA: Diagnosis not present

## 2019-07-14 DIAGNOSIS — E669 Obesity, unspecified: Secondary | ICD-10-CM

## 2019-07-15 NOTE — Progress Notes (Signed)
Office: [phone]  /  Fax: [phone]   HPI:   Chief Complaint: OBESITY [name] is here to discuss her progress with her obesity treatment plan. She is keeping a food journal with 1400-1500 calories and 90 grams of protein and is following her eating plan approximately 50% of the time. She states she is exercising via [employer] app 30 minutes 3 times per week. [name] has been off work recently for a vacation and has been off the plan. She has been having some alcohol where she had cut out alcohol before. Denies polyphagia. She reports she is now back on the plan. Her weight is 183 lb (83 kg) today and has had a weight gain of 1 lb since her last visit. She has lost 8 lbs since starting treatment with [place].  Depression with emotional eating behaviors [name] is struggling with emotional eating and using food for comfort to the extent that it is negatively impacting her health. She often snacks when she is not hungry. [name] sometimes feels she is out of control and then feels guilty that she made poor food choices. She has been working on behavior modification techniques to help reduce her emotional eating and has been somewhat successful. [name] is taking bupropion 150 mg daily and denies side effects. She is unsure if it is helping. She shows no sign of suicidal or homicidal ideations.  Depression screen [facility] 2/9 10/16/2018 03/15/2016 11/15/2015 08/09/2015 06/07/2015  Decreased Interest 1 0 0 0 0  Down, Depressed, Hopeless 1 0 0 0 0  PHQ - 2 Score 2 0 0 0 0  Altered sleeping 0 - - - -  Tired, decreased energy 1 - - - -  Change in appetite 2 - - - -  Feeling bad or failure about yourself  1 - - - -  Trouble concentrating 0 - - - -  Moving slowly or fidgety/restless 0 - - - -  Suicidal thoughts 0 - - - -  PHQ-9 Score 6 - - - -  Difficult doing work/chores Not difficult at all - - - -   Vitamin D deficiency [name] has a diagnosis of Vitamin D deficiency, which is not quite at  goal. Last Vitamin D was 46.9 on 05/05/2019. She is currently taking OTC Vit D and denies nausea, vomiting or muscle weakness.  At risk for osteopenia and osteoporosis [name] is at higher risk of osteopenia and osteoporosis due to Vitamin D deficiency.   ASSESSMENT AND PLAN:  Vitamin D deficiency  Other depression - with emotional eating  - Plan: buPROPion (WELLBUTRIN SR) 150 MG 12 hr tablet  At risk for osteoporosis  Class 1 obesity with serious comorbidity and body mass index (BMI) of 31.0 to 31.9 in adult, unspecified obesity type  PLAN:  Depression with Emotional Eating Behaviors We discussed behavior modification techniques today to help [name] deal with her emotional eating and depression. [name] was given a refill on her bupropion 150 mg QAM #30 with 0 refills and agrees to follow-up with our clinic in 2-3 weeks. She is referred to Dr. Mallie Mussel, our bariatric psychologist, for evaluation.   Vitamin D Deficiency [name] was informed that low Vitamin D levels contributes to fatigue and are associated with obesity, breast, and colon cancer. She agrees to continue taking OTC Vit D @ 2,000 IU daily and will follow-up for routine testing of Vitamin D, at least 2-3 times per year. She was informed of the risk of over-replacement of Vitamin D and agrees to  not increase her dose unless she discusses this with [place] first. [name] agrees to follow-up with our clinic in 2-3 weeks.  At risk for osteopenia and osteoporosis [name] was given extended  (15 minutes) osteoporosis prevention counseling today. [name] is at risk for osteopenia and osteoporosis due to her Vitamin D deficiency. She was encouraged to take her Vitamin D and follow her higher calcium diet and increase strengthening exercise to help strengthen her bones and decrease her risk of osteopenia and osteoporosis.  Obesity [name] is currently in the action stage of change. As such, her goal is to continue with weight loss efforts.  She has agreed to keep a food journal with 1400 calories and 90 grams of protein daily. [name] has been instructed to continue her current exercise regimen for weight loss and overall health benefits. We discussed the following Behavioral Modification Strategies today: increasing lean protein intake, decrease ETOH, emotional eating strategies, and keep a strict food journal.   [name] has agreed to follow-up with our clinic in 2-3 weeks. She was informed of the importance of frequent follow-up visits to maximize her success with intensive lifestyle modifications for her multiple health conditions.  ALLERGIES: No Known Allergies  MEDICATIONS: Current Outpatient Medications on File Prior to Visit  Medication Sig Dispense Refill  . buPROPion (WELLBUTRIN SR) 150 MG 12 hr tablet Take 1 tablet (150 mg total) by mouth daily. 30 tablet 0  . Vitamin D, Cholecalciferol, 50 MCG (2000 UT) CAPS Take 1 capsule by mouth daily.     No current facility-administered medications on file prior to visit.     PAST MEDICAL HISTORY: Past Medical History:  Diagnosis Date  . Anemia   . Back pain   . Depression    followed by [name]  . Gallbladder problem   . GERD (gastroesophageal reflux disease)   . History of kidney problems    1975, eval by 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 Kidney problem   . Migraines    [facility]  . Palpitations   . Vitamin D deficiency     PAST SURGICAL HISTORY: Past Surgical History:  Procedure Laterality Date  . CESAREAN SECTION  2007 & 2009  . CHOLECYSTECTOMY  2009  . CYSTOSCOPY  age 54  . SHOULDER ARTHROSCOPY WITH SUBACROMIAL DECOMPRESSION, ROTATOR CUFF REPAIR AND BICEP TENDON REPAIR Right 02/11/2016   Procedure: RIGHT SHOULDER ARTHROSCOPY WITH SUBACROMIAL  DECOMPRESSION, OPEN DCR,  OPEN MINI ROTATOR CUFF REPAIR, LABRAL REPAIR;  Surgeon: Netta Cedars, MD;  Location: [place];  Service: Orthopedics;  Laterality: Right;  . TUBAL LIGATION    . uterine ablation      SOCIAL HISTORY: Social History   Tobacco Use  . Smoking status: Former Research scientist (life sciences)  . Smokeless tobacco: Never Used  Substance Use Topics  . Alcohol use: Yes    Comment: rare  . Drug use: No    FAMILY HISTORY: Family History  Problem Relation Age of Onset  . Hyperlipidemia Mother   . Depression Mother   . Cancer Mother   . Anxiety disorder Mother   . Obesity Mother   . Migraines Father   . Hypertension Father   . Hyperlipidemia Father   . Cancer Father   . Bipolar disorder Sister   . Diabetes Paternal Grandfather   . Heart  disease Paternal Grandfather    ROS: Review of Systems  Gastrointestinal: Negative for nausea and vomiting.  Musculoskeletal:       Negative for muscle weakness.  Psychiatric/Behavioral: Positive for depression (emotional eating). Negative for suicidal ideas.       Negative for homicidal ideas.   PHYSICAL EXAM: Blood pressure 100/61, pulse 68, temperature 98.1 F (36.7 C), temperature source Oral, height 5\' 4"  (1.626 m), weight 183 lb (83 kg), SpO2 96 %. Body mass index is 31.41 kg/m. Physical Exam Vitals signs reviewed.  Constitutional:      Appearance: Normal appearance. She is obese.  Cardiovascular:     Rate and Rhythm: Normal rate.     Pulses: Normal pulses.  Pulmonary:     Effort: Pulmonary effort is normal.     Breath sounds: Normal breath sounds.  Musculoskeletal: Normal range of motion.  Skin:    General: Skin is warm and dry.  Neurological:     Mental Status: She is alert and oriented to person, place, and time.  Psychiatric:        Behavior: Behavior normal.   RECENT LABS AND TESTS: BMET    Component Value Date/Time   NA 145 (H) 10/16/2018 1150   K 4.2 10/16/2018 1150   CL 102 10/16/2018 1150   CO2 24 10/16/2018 1150    GLUCOSE 78 10/16/2018 1150   GLUCOSE 98 02/01/2016 1129   BUN 12 10/16/2018 1150   CREATININE 0.80 10/16/2018 1150   CALCIUM 9.6 10/16/2018 1150   GFRNONAA 89 10/16/2018 1150   GFRAA 102 10/16/2018 1150   Lab Results  Component Value Date   HGBA1C 5.0 10/16/2018   Lab Results  Component Value Date   INSULIN 3.6 10/16/2018   CBC    Component Value Date/Time   WBC 5.8 10/16/2018 1150   WBC 9.0 02/01/2016 1129   RBC 4.16 10/16/2018 1150   RBC 4.41 02/01/2016 1129   HGB 13.1 10/16/2018 1150   HCT 38.3 10/16/2018 1150   PLT 200 02/01/2016 1129   MCV 92 10/16/2018 1150   MCH 31.5 10/16/2018 1150   MCH 31.1 02/01/2016 1129   MCHC 34.2 10/16/2018 1150   MCHC 33.9 02/01/2016 1129   RDW 11.8 (L) 10/16/2018 1150   LYMPHSABS 2.0 10/16/2018 1150   MONOABS 0.3 10/12/2008 0830   EOSABS 0.0 10/16/2018 1150   BASOSABS 0.0 10/16/2018 1150   Iron/TIBC/Ferritin/ %Sat No results found for: IRON, TIBC, FERRITIN, IRONPCTSAT Lipid Panel     Component Value Date/Time   CHOL 159 10/16/2018 1150   TRIG 47 10/16/2018 1150   HDL 55 10/16/2018 1150   LDLCALC 95 10/16/2018 1150   Hepatic Function Panel     Component Value Date/Time   PROT 7.3 10/16/2018 1150   ALBUMIN 4.5 10/16/2018 1150   AST 33 10/16/2018 1150   ALT 26 10/16/2018 1150   ALKPHOS 56 10/16/2018 1150   BILITOT 0.3 10/16/2018 1150   BILIDIR 0.1 05/05/2008 0114   IBILI 0.5 05/05/2008 0114      Component Value Date/Time   TSH 2.160 10/16/2018 1150   Results for JASHAE, WIGGS (MRN [id number]) as of 07/15/2019 10:46  Ref. Range 05/05/2019 11:28  Vitamin D, 25-Hydroxy Latest Ref Range: 30.0 - 100.0 ng/mL 46.9   OBESITY BEHAVIORAL INTERVENTION VISIT  Today's visit was #17  Starting weight: 191 lbs Starting date: 10/16/2018 Today's weight: 183 lbs  Today's date: 07/14/2019 Total lbs lost to date: 8    07/14/2019  Height 5\' 4"  (1.626 m)  Weight 183 lb (83 kg)  BMI (Calculated) 31.4  BLOOD PRESSURE - SYSTOLIC 696   BLOOD PRESSURE - DIASTOLIC 61   Body Fat % 78.9 %  Total Body Water (lbs) 42.5 lbs   ASK: We discussed the diagnosis of obesity with [name] today and [name] agreed to give [place] permission to discuss obesity behavioral modification therapy today.  ASSESS: Kimberli has the diagnosis of obesity and her BMI today is 31.4. [name] is in the action stage of change.   ADVISE: [name] was educated on the multiple health risks of obesity as well as the benefit of weight loss to improve her health. She was advised of the need for long term treatment and the importance of lifestyle modifications to improve her current health and to decrease her risk of future health problems.  AGREE: Multiple dietary modification options and treatment options were discussed and  [name] agreed to follow the recommendations documented in the above note.  ARRANGE: [name] was educated on the importance of frequent visits to treat obesity as outlined per CMS and USPSTF guidelines and agreed to schedule her next follow up appointment today.  IMichaelene Song, am acting as Location manager for [employer], FNP  I have reviewed the above documentation for accuracy and completeness, and I agree with the above.  - Eliah Ozawa, FNP-C.

## 2019-07-16 ENCOUNTER — Encounter (INDEPENDENT_AMBULATORY_CARE_PROVIDER_SITE_OTHER): Payer: Self-pay | Admitting: Family Medicine

## 2019-07-16 MED ORDER — BUPROPION HCL ER (SR) 150 MG PO TB12: 150.0000 mg | ORAL_TABLET | Freq: Every day | ORAL | 0 refills | Status: DC

## 2019-07-16 MED FILL — BUPROPION HCL SR 150 MG TAB: 150 | 30 days supply | Qty: 30 | Fill #0

## 2019-07-21 DIAGNOSIS — R198 Other specified symptoms and signs involving the digestive system and abdomen: Secondary | ICD-10-CM | POA: Diagnosis not present
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
## 2019-07-23 ENCOUNTER — Ambulatory Visit (INDEPENDENT_AMBULATORY_CARE_PROVIDER_SITE_OTHER): Payer: 59 | Admitting: Psychology

## 2019-07-23 ENCOUNTER — Other Ambulatory Visit: Payer: Self-pay

## 2019-07-23 ENCOUNTER — Encounter (INDEPENDENT_AMBULATORY_CARE_PROVIDER_SITE_OTHER): Payer: Self-pay

## 2019-07-23 DIAGNOSIS — F3289 Other specified depressive episodes: Secondary | ICD-10-CM | POA: Diagnosis not present

## 2019-07-25 MED FILL — SM GENTLE LAXATIVE EC 5 MG: 5 MG | 1 days supply | Qty: 4 | Fill #0

## 2019-07-25 MED FILL — PEG-3350 SOLUTION: 420 | 1 days supply | Qty: 4000 | Fill #0

## 2019-07-29 DIAGNOSIS — K625 Hemorrhage of anus and rectum: Secondary | ICD-10-CM | POA: Diagnosis not present

## 2019-07-29 DIAGNOSIS — D123 Benign neoplasm of transverse colon: Secondary | ICD-10-CM | POA: Diagnosis not present

## 2019-07-30 ENCOUNTER — Ambulatory Visit (INDEPENDENT_AMBULATORY_CARE_PROVIDER_SITE_OTHER): Payer: 59 | Admitting: Family Medicine

## 2019-07-30 ENCOUNTER — Other Ambulatory Visit: Payer: Self-pay

## 2019-07-30 VITALS — BP 85/57 | HR 75 | Temp 98.4°F | Ht 64.0 in | Wt 180.2 lb

## 2019-07-30 DIAGNOSIS — E669 Obesity, unspecified: Secondary | ICD-10-CM

## 2019-07-30 DIAGNOSIS — Z6830 Body mass index (BMI) 30.0-30.9, adult: Secondary | ICD-10-CM

## 2019-07-30 DIAGNOSIS — F3289 Other specified depressive episodes: Secondary | ICD-10-CM | POA: Diagnosis not present

## 2019-07-30 DIAGNOSIS — E559 Vitamin D deficiency, unspecified: Secondary | ICD-10-CM

## 2019-07-30 NOTE — Progress Notes (Signed)
Office: [phone]  /  Fax: [phone]   HPI:   Chief Complaint: OBESITY [name] is here to discuss [name] progress with [name] obesity treatment plan. [name] is on the keep a food journal with 1500 calories and 90 grams of protein daily plan and is following [name] eating plan approximately 75 % of the time. [name] states [name] is doing strength training, cardio exercise, yoga 30 minutes 5 times per week and [name] is using an app. Nadeen journaled almost every day over the past few weeks and [name] calories and protein have been in range. [name] feels [name] eats more carbs when [name] is tired from work. [name] works night shift. [name] weight is 180 lb 3.2 oz (81.7 kg) today and has had a weight loss of 3 pounds over a period of 2 weeks since [name] last visit. [name] has lost 11 lbs since starting treatment with [place].  Vitamin D deficiency [name] has a diagnosis of vitamin D deficiency. [name] is at goal on OTC vit D and [name] denies nausea, vomiting or muscle weakness.  Depression with emotional eating behaviors [name] has been taking notes about triggers for cravings. [name] fatigue seems to trigger cravings. [name] saw Dr. Mallie Mussel for [name] initial visit. [name] reports no Bupropion side effects. Jariel is unsure if it is helping with cravings but [name] feels [name] mood is more stable. [name] has been working on behavior modification techniques to help reduce [name] emotional eating and has been somewhat successful. [name] shows no sign of suicidal or homicidal ideations.  ASSESSMENT AND PLAN:  Vitamin D deficiency  Other depression - with emotional eating   Class 1 obesity with serious comorbidity and body mass index (BMI) of 30.0 to 30.9 in adult, unspecified obesity type  PLAN:  Vitamin D Deficiency Tyshay was informed that low vitamin D levels contributes to fatigue and are associated with obesity, breast, and colon cancer. [name] will continue OTC Vit D and [name] will follow up for routine testing of vitamin D, at least 2-3 times  per year. [name] was informed of the risk of over-replacement of vitamin D and agrees to not increase [name] dose unless [name] discusses this with [place] first.  Depression with Emotional Eating Behaviors We discussed behavior modification techniques today to help [name] deal with [name] emotional eating and depression. Lawanda will continue Bupropion 150 mg daily and [name] will follow up with Dr. Mallie Mussel.   Obesity [name] is currently in the action stage of change. As such, [name] goal is to continue with weight loss efforts [name] has agreed to keep a food journal with 1500 calories and 90 grams of protein daily [name] will continue [name] exercise regimen for weight loss and overall health benefits. We discussed the following Behavioral Modification Strategies today: planning for success, increase H2O intake and increasing lean protein intake  Deb has agreed to follow up with our clinic in 2 to 3 weeks. [name] was informed of the importance of frequent follow up visits to maximize [name] success with intensive lifestyle modifications for [name] multiple health conditions.  ALLERGIES: No Known Allergies  MEDICATIONS: Current Outpatient Medications on File Prior to Visit  Medication Sig Dispense Refill   buPROPion (WELLBUTRIN SR) 150 MG 12 hr tablet Take 1 tablet (150 mg total) by mouth daily. 30 tablet 0   Vitamin D, Cholecalciferol, 50 MCG (2000 UT) CAPS Take 1 capsule by mouth daily.     No current facility-administered medications on file prior to visit.     PAST MEDICAL HISTORY:  Past Medical History:  Diagnosis Date   Anemia    Back pain    Depression    followed by [name]   Gallbladder problem    GERD (gastroesophageal reflux disease)    History of kidney problems    1975, eval by 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 w/ no evidence of obstruction & he felt no further invasive studies were  needed    Kidney problem    Migraines    [facility]   Palpitations    Vitamin D deficiency     PAST SURGICAL HISTORY: Past Surgical History:  Procedure Laterality Date   CESAREAN SECTION  2007 & 2009   CHOLECYSTECTOMY  2009   CYSTOSCOPY  age 57   SHOULDER ARTHROSCOPY WITH SUBACROMIAL DECOMPRESSION, ROTATOR CUFF REPAIR AND BICEP TENDON REPAIR Right 02/11/2016   Procedure: RIGHT SHOULDER ARTHROSCOPY WITH SUBACROMIAL DECOMPRESSION, OPEN DCR,  OPEN MINI ROTATOR CUFF REPAIR, LABRAL REPAIR;  Surgeon: Netta Cedars, MD;  Location: [facility];  Service: Orthopedics;  Laterality: Right;   TUBAL LIGATION     uterine ablation      SOCIAL HISTORY: Social History   Tobacco Use   Smoking status: Former Smoker   Smokeless tobacco: Never Used  Substance Use Topics   Alcohol use: Yes    Comment: rare   Drug use: No    FAMILY HISTORY: Family History  Problem Relation Age of Onset   Hyperlipidemia Mother    Depression Mother    Cancer Mother    Anxiety disorder Mother    Obesity Mother    Migraines Father    Hypertension Father    Hyperlipidemia Father    Cancer Father    Bipolar disorder Sister    Diabetes Paternal Grandfather    Heart disease Paternal Grandfather     ROS: Review of Systems  Constitutional: Positive for weight loss.  Gastrointestinal: Negative for nausea and vomiting.  Musculoskeletal:       Negative for muscle weakness  Endo/Heme/Allergies:       Positive for cravings  Psychiatric/Behavioral: Positive for depression. Negative for suicidal ideas.    PHYSICAL EXAM: Blood pressure (!) 85/57, pulse 75, temperature 98.4 F (36.9 C), temperature source Oral, height 5\' 4"  (1.626 m), weight 180 lb 3.2 oz (81.7 kg), last menstrual period 07/16/2019, SpO2 97 %. Body mass index is 30.93 kg/m. Physical Exam Vitals signs reviewed.  Constitutional:      Appearance: Normal appearance. [name] is well-developed. [name] is obese.    Cardiovascular:     Rate and Rhythm: Normal rate.  Pulmonary:     Effort: Pulmonary effort is normal.  Musculoskeletal: Normal range of motion.  Skin:    General: Skin is warm and dry.  Neurological:     Mental Status: [name] is alert and oriented to person, place, and time.  Psychiatric:        Mood and Affect: Mood normal.        Behavior: Behavior normal.        Thought Content: Thought content does not include homicidal or suicidal ideation.     RECENT LABS AND TESTS: BMET    Component Value Date/Time   NA 145 (H) 10/16/2018 1150   K 4.2 10/16/2018 1150   CL 102 10/16/2018 1150   CO2 24 10/16/2018 1150   GLUCOSE 78 10/16/2018 1150   GLUCOSE 98 02/01/2016 1129   BUN 12  10/16/2018 1150   CREATININE 0.80 10/16/2018 1150   CALCIUM 9.6 10/16/2018 1150   GFRNONAA 89 10/16/2018 1150   GFRAA 102 10/16/2018 1150   Lab Results  Component Value Date   HGBA1C 5.0 10/16/2018   Lab Results  Component Value Date   INSULIN 3.6 10/16/2018   CBC    Component Value Date/Time   WBC 5.8 10/16/2018 1150   WBC 9.0 02/01/2016 1129   RBC 4.16 10/16/2018 1150   RBC 4.41 02/01/2016 1129   HGB 13.1 10/16/2018 1150   HCT 38.3 10/16/2018 1150   PLT 200 02/01/2016 1129   MCV 92 10/16/2018 1150   MCH 31.5 10/16/2018 1150   MCH 31.1 02/01/2016 1129   MCHC 34.2 10/16/2018 1150   MCHC 33.9 02/01/2016 1129   RDW 11.8 (L) 10/16/2018 1150   LYMPHSABS 2.0 10/16/2018 1150   MONOABS 0.3 10/12/2008 0830   EOSABS 0.0 10/16/2018 1150   BASOSABS 0.0 10/16/2018 1150   Iron/TIBC/Ferritin/ %Sat No results found for: IRON, TIBC, FERRITIN, IRONPCTSAT Lipid Panel     Component Value Date/Time   CHOL 159 10/16/2018 1150   TRIG 47 10/16/2018 1150   HDL 55 10/16/2018 1150   LDLCALC 95 10/16/2018 1150   Hepatic Function Panel     Component Value Date/Time   PROT 7.3 10/16/2018 1150   ALBUMIN 4.5 10/16/2018 1150   AST 33 10/16/2018 1150   ALT 26 10/16/2018 1150   ALKPHOS 56 10/16/2018 1150    BILITOT 0.3 10/16/2018 1150   BILIDIR 0.1 05/05/2008 0114   IBILI 0.5 05/05/2008 0114      Component Value Date/Time   TSH 2.160 10/16/2018 1150    Results for CARMETTA, BERING (MRN [id number]) as of 07/30/2019 14:00  Ref. Range 05/05/2019 11:28  Vitamin D, 25-Hydroxy Latest Ref Range: 30.0 - 100.0 ng/mL 46.9    OBESITY BEHAVIORAL INTERVENTION VISIT  Today's visit was # 18   Starting weight: 191 lbs Starting date: 10/16/2018 Today's weight : 180 lbs Today's date: 07/30/2019 Total lbs lost to date: 11    07/30/2019  Height 5\' 4"  (1.626 m)  Weight 180 lb 3.2 oz (81.7 kg)  BMI (Calculated) 30.92  BLOOD PRESSURE - SYSTOLIC 85  BLOOD PRESSURE - DIASTOLIC 57   Body Fat % [id number] %  Total Body Water (lbs) 76.6 lbs    ASK: We discussed the diagnosis of obesity with Myrtie Cruise today and Christabel agreed to give [place] permission to discuss obesity behavioral modification therapy today.  ASSESS: Adiela has the diagnosis of obesity and [name] BMI today is 30.92 Jaylia is in the action stage of change   ADVISE: Tahani was educated on the multiple health risks of obesity as well as the benefit of weight loss to improve [name] health. [name] was advised of the need for long term treatment and the importance of lifestyle modifications to improve [name] current health and to decrease [name] risk of future health problems.  AGREE: Multiple dietary modification options and treatment options were discussed and  Raiana agreed to follow the recommendations documented in the above note.  ARRANGE: Ruqayya was educated on the importance of frequent visits to treat obesity as outlined per CMS and USPSTF guidelines and agreed to schedule [name] next follow up appointment today.  I, Doreene Nest, am acting as transcriptionist for [employer], FNP-C  I have reviewed the above documentation for accuracy and completeness, and I agree with the above.  - Katelynne Revak, FNP-C.

## 2019-07-31 ENCOUNTER — Encounter (INDEPENDENT_AMBULATORY_CARE_PROVIDER_SITE_OTHER): Payer: Self-pay | Admitting: Family Medicine

## 2019-08-11 NOTE — Progress Notes (Signed)
Office: [phone]  /  Fax: [phone]    Date: August 19, 2019   Appointment Start Time: 2:30pm Duration: 32 minutes Provider: Glennie Coleman, Psy.D. Type of Session: Individual Therapy  Location of Patient: Home Location of Provider: Provider's Home Type of Contact: Telepsychological Visit via [employer] WebEx   Session Content: [name] is a 48 y.o. female presenting via [place] for a follow-up appointment to address the previously established treatment goal of decreasing emotional eating. Today's appointment was a telepsychological visit, as this provider's clinic is seeing a limited number of patients for in-person visits due to COVID-19. Therapeutic services will resume to in-person appointments once deemed appropriate. [name] expressed understanding regarding the rationale for telepsychological services, and provided verbal consent for today's appointment. Prior to proceeding with today's appointment, [name]'s physical location at the time of this appointment was obtained. [name] reported she was at home and provided the address. In the event of technical difficulties, [name] shared a phone number she could be reached at. [name] and this provider participated in today's telepsychological service. Also, [name] denied anyone else being present in the room or on the WebEx appointment.  This provider conducted a brief check-in and verbally administered the PHQ-9 and GAD-7. [name] shared there have not been any significant changes. However, she discussed "anxiety" regarding current events, which has also contributed to feeling hopeless. She denied hopelessness being related to suicidal ideation. This provider and [name] discussed reducing news consumption. She was agreeable to limiting sources of news and reducing news consumption from multiple times a day to one to two times a day. Regarding eating, [name] stated she deviated during her camping trip, but noted, "Overall my eating has  been pretty good." She also discussed reviewing the handout shared by this provider. Moreover, psychoeducation regarding triggers for emotional eating was provided. [name] was provided a handout, and encouraged to utilize the handout between now and the next appointment to increase awareness of triggers and frequency. [name] agreed. This provider also discussed behavioral strategies for specific triggers, such as placing the utensil down when conversing to avoid mindless eating. [name] provided verbal consent during today's appointment for this provider to send the handout about triggers via e-mail. Overall, [name] was receptive to today's session as evidenced by openness to sharing, responsiveness to feedback, and willingness to explore triggers for emotional eating.  Mental Status Examination:  Appearance: neat Behavior: cooperative Mood: euthymic Affect: mood congruent Speech: normal in rate, volume, and tone Eye Contact: appropriate Psychomotor Activity: appropriate Thought Process: linear, logical, and goal directed  Content/Perceptual Disturbances: no hallucinations, delusions, bizarre thinking or behavior reported or observed and denies of suicidal and homicidal ideation, plan, and intent Orientation: time, person, place and purpose of appointment Cognition/Sensorium: memory, attention, language, and fund of knowledge intact  Insight: good Judgment: good  Structured Assessment Results: The Patient Health Questionnaire-9 (PHQ-9) is a self-report measure that assesses symptoms and severity of depression over the course of the last two weeks. [name] obtained a score of 4 suggesting minimal depression. [name] finds the endorsed symptoms to be somewhat difficult. Little interest or pleasure in doing things 0  Feeling down, depressed, or hopeless- current events 1  Trouble falling or staying asleep, or sleeping too much 1  Feeling tired or having little energy 1  Poor appetite or  overeating 1  Feeling bad about yourself --- or that you are a failure or have let yourself or your family down 0  Trouble concentrating on things, such as reading the newspaper or  watching television 0  Moving or speaking so slowly that other people could have noticed? Or the opposite --- being so fidgety or restless that you have been moving around a lot more than usual 0  Thoughts that you would be better off dead or hurting yourself in some way 0  PHQ-9 Score 4    The Generalized Anxiety Disorder-7 (GAD-7) is a brief self-report measure that assesses symptoms of anxiety over the course of the last two weeks. [name] obtained a score of 6 suggesting mild anxiety. [name] finds the endorsed symptoms to be extremely difficult. Feeling nervous, anxious, on edge 1  Not being able to stop or control worrying 1  Worrying too much about different things 1  Trouble relaxing 1  Being so restless that it's hard to sit still 0  Becoming easily annoyed or irritable 1  Feeling afraid as if something awful might happen- current events 1  GAD-7 Score 6   Interventions:  Conducted a brief chart review Verbal administration of PHQ-9 and GAD-7 for symptom monitoring Provided empathic reflections and validation Reviewed content from the previous session Psychoeducation provided regarding triggers for emotional eating Focused on rapport building Employed supportive psychotherapy interventions to facilitate reduced distress, and to improve coping skills with identified stressors  DSM-5 Diagnosis: 311 (F32.8) Other Specified Depressive Disorder, Emotional Eating Behaviors  Treatment Goal & Progress: During the initial appointment with this provider, the following treatment goal was established: decrease emotional eating. [name] has demonstrated some progress in her goal as evidenced by increased awareness of hunger patterns.   Plan: [name] continues to appear able and willing to participate as evidenced  by engagement in reciprocal conversation, and asking questions for clarification as appropriate. The next appointment will be scheduled in three weeks, which will be via [employer]. The next session will focus on the introduction of mindfulness.

## 2019-08-18 ENCOUNTER — Ambulatory Visit (INDEPENDENT_AMBULATORY_CARE_PROVIDER_SITE_OTHER): Payer: Self-pay | Admitting: Psychology

## 2019-08-19 ENCOUNTER — Ambulatory Visit (INDEPENDENT_AMBULATORY_CARE_PROVIDER_SITE_OTHER): Payer: 59 | Admitting: Psychology

## 2019-08-19 ENCOUNTER — Other Ambulatory Visit: Payer: Self-pay

## 2019-08-19 DIAGNOSIS — F3289 Other specified depressive episodes: Secondary | ICD-10-CM | POA: Diagnosis not present

## 2019-08-21 ENCOUNTER — Other Ambulatory Visit: Payer: Self-pay

## 2019-08-21 ENCOUNTER — Ambulatory Visit (INDEPENDENT_AMBULATORY_CARE_PROVIDER_SITE_OTHER): Payer: 59 | Admitting: Family Medicine

## 2019-08-21 VITALS — BP 91/63 | HR 78 | Temp 98.3°F | Ht 64.0 in | Wt 178.0 lb

## 2019-08-21 DIAGNOSIS — E559 Vitamin D deficiency, unspecified: Secondary | ICD-10-CM | POA: Diagnosis not present

## 2019-08-21 DIAGNOSIS — Z9189 Other specified personal risk factors, not elsewhere classified: Secondary | ICD-10-CM | POA: Diagnosis not present

## 2019-08-21 DIAGNOSIS — F3289 Other specified depressive episodes: Secondary | ICD-10-CM | POA: Diagnosis not present

## 2019-08-21 DIAGNOSIS — E669 Obesity, unspecified: Secondary | ICD-10-CM

## 2019-08-21 DIAGNOSIS — E66811 Obesity, class 1: Secondary | ICD-10-CM

## 2019-08-21 DIAGNOSIS — Z6830 Body mass index (BMI) 30.0-30.9, adult: Secondary | ICD-10-CM

## 2019-08-21 MED ORDER — BUPROPION HCL ER (SR) 150 MG PO TB12: 150.0000 mg | ORAL_TABLET | Freq: Every day | ORAL | 0 refills | Status: DC

## 2019-08-21 MED FILL — BUPROPION HCL SR 150 MG TAB: 150 | 30 days supply | Qty: 30 | Fill #0

## 2019-08-26 ENCOUNTER — Encounter (INDEPENDENT_AMBULATORY_CARE_PROVIDER_SITE_OTHER): Payer: Self-pay | Admitting: Family Medicine

## 2019-08-26 NOTE — Progress Notes (Signed)
Office: [phone]  /  Fax: [phone]   HPI:   Chief Complaint: OBESITY [name] is here to discuss her progress with her obesity treatment plan. She is on the  keep a food journal with 1500 calories and 90g of protein  daily and is following her eating plan approximately 75 % of the time. She states she is exercising cardio, strength train, and yoga for 30-40 minutes 5-6 times per week. [name] had been off her plan briefly during a camping trip but is now back on plan and journaling consistently. She meets her protein goal most days.  Her weight is 178 lb (80.7 kg) today and has had a weight loss of 2 pounds over a period of 3 weeks since her last visit. She has lost 13 lbs since starting treatment with [place].  Vitamin D deficiency [name] has a diagnosis of vitamin D deficiency. She is currently taking OTC vit D 2000 IU daily and denies nausea, vomiting or muscle weakness. Her vitamin D level is at goal.   At risk for osteopenia and osteoporosis [name] is at higher risk of osteopenia and osteoporosis due to vitamin D deficiency.   Depression with emotional eating behaviors [name] is struggling with emotional eating and using food for comfort to the extent that it is negatively impacting her health. She has been working on behavior modification techniques to help reduce her emotional eating and has been successful. She has seen Dr. Mallie Mussel (psychologist) 2 times and is on Wellbutrin. She feels seeing Dr. Mallie Mussel is helpful. She reports her stress eating is better controlled. Her mood is fairly stable.  She shows no sign of suicidal or homicidal ideations.  Depression screen [facility] 2/9 10/16/2018 03/15/2016 11/15/2015 08/09/2015 06/07/2015  Decreased Interest 1 0 0 0 0  Down, Depressed, Hopeless 1 0 0 0 0  PHQ - 2 Score 2 0 0 0 0  Altered sleeping 0 - - - -  Tired, decreased energy 1 - - - -  Change in appetite 2 - - - -  Feeling bad or failure about yourself  1 - - - -  Trouble  concentrating 0 - - - -  Moving slowly or fidgety/restless 0 - - - -  Suicidal thoughts 0 - - - -  PHQ-9 Score 6 - - - -  Difficult doing work/chores Not difficult at all - - - -     ASSESSMENT AND PLAN:  Other depression - with emotional eating  - Plan: buPROPion (WELLBUTRIN SR) 150 MG 12 hr tablet, DISCONTINUED: buPROPion (WELLBUTRIN SR) 150 MG 12 hr tablet  Vitamin D deficiency  At risk for osteoporosis  Class 1 obesity with serious comorbidity and body mass index (BMI) of 30.0 to 30.9 in adult, unspecified obesity type - BMI Greater than 30 at start of program   PLAN: Vitamin D Deficiency [name] was informed that low vitamin D levels contributes to fatigue and are associated with obesity, breast, and colon cancer. She agrees to continue to take OTC Vit D 2000 IU daily and will follow up for routine testing of vitamin D, at least 2-3 times per year. She was informed of the risk of over-replacement of vitamin D and agrees to not increase her dose unless she discusses this with [place] first. Agrees to follow up with our clinic as directed.   At risk for osteopenia and osteoporosis [name] was given extended  (15 minutes) osteoporosis prevention counseling today. [name] is at risk for osteopenia and osteoporosis due to her  vitamin D deficiency. She was encouraged to take her vitamin D and follow her higher calcium diet and increase strengthening exercise to help strengthen her bones and decrease her risk of osteopenia and osteoporosis.  Depression with Emotional Eating Behaviors We discussed behavior modification techniques today to help [name] deal with her emotional eating and depression. She has agreed to continue to take Wellbutrin SR 150 mg qd #30 with no refills and agreed to follow up as directed.  Obesity [name] is currently in the action stage of change. As such, her goal is to continue with weight loss efforts She has agreed to keep a food journal with 1500 calories and 90g  of protein  daily [name] will continue current exercise regimen for weight loss and overall health benefits.  We discussed the following Behavioral Modification Strategies today: planning for success and keeping a strict food journal.    [name] has agreed to follow up with our clinic in 3 weeks. She was informed of the importance of frequent follow up visits to maximize her success with intensive lifestyle modifications for her multiple health conditions.  ALLERGIES: No Known Allergies  MEDICATIONS: Current Outpatient Medications on File Prior to Visit  Medication Sig Dispense Refill  . Vitamin D, Cholecalciferol, 50 MCG (2000 UT) CAPS Take 1 capsule by mouth daily.     No current facility-administered medications on file prior to visit.     PAST MEDICAL HISTORY: Past Medical History:  Diagnosis Date  . Anemia   . Back pain   . Depression    followed by [name]  . Gallbladder problem   . GERD (gastroesophageal reflux disease)   . History of kidney problems    1975, eval by 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 Kidney problem   . Migraines    [facility]  . Palpitations   . Vitamin D deficiency     PAST SURGICAL HISTORY: Past Surgical History:  Procedure Laterality Date  . CESAREAN SECTION  2007 & 2009  . CHOLECYSTECTOMY  2009  . CYSTOSCOPY  age 26  . SHOULDER ARTHROSCOPY WITH SUBACROMIAL DECOMPRESSION, ROTATOR CUFF REPAIR AND BICEP TENDON REPAIR Right 02/11/2016   Procedure: RIGHT SHOULDER ARTHROSCOPY WITH SUBACROMIAL DECOMPRESSION, OPEN DCR,  OPEN MINI ROTATOR CUFF REPAIR, LABRAL REPAIR;  Surgeon: Netta Cedars, MD;  Location: [place];  Service: Orthopedics;  Laterality: Right;  . TUBAL LIGATION    . uterine ablation      SOCIAL HISTORY: Social History   Tobacco  Use  . Smoking status: Former Research scientist (life sciences)  . Smokeless tobacco: Never Used  Substance Use Topics  . Alcohol use: Yes    Comment: rare  . Drug use: No    FAMILY HISTORY: Family History  Problem Relation Age of Onset  . Hyperlipidemia Mother   . Depression Mother   . Cancer Mother   . Anxiety disorder Mother   . Obesity Mother   . Migraines Father   . Hypertension Father   . Hyperlipidemia Father   . Cancer Father   . Bipolar disorder Sister   . Diabetes Paternal Grandfather   . Heart disease Paternal Grandfather     ROS: Review of Systems  Constitutional: Positive for weight loss.  Gastrointestinal: Negative for nausea and vomiting.  Musculoskeletal:       Negative  for muscle weakness  Psychiatric/Behavioral: Positive for depression. Negative for suicidal ideas.    PHYSICAL EXAM: Blood pressure 91/63, pulse 78, temperature 98.3 F (36.8 C), temperature source Oral, height 5\' 4"  (1.626 m), weight 178 lb (80.7 kg), last menstrual period 08/07/2019, SpO2 97 %. Body mass index is 30.55 kg/m. Physical Exam Vitals signs reviewed.  Constitutional:      Appearance: Normal appearance. She is obese.  HENT:     Head: Normocephalic.     Nose: Nose normal.  Neck:     Musculoskeletal: Normal range of motion.  Cardiovascular:     Rate and Rhythm: Normal rate.  Pulmonary:     Effort: Pulmonary effort is normal.  Musculoskeletal: Normal range of motion.  Skin:    General: Skin is warm and dry.  Neurological:     Mental Status: She is alert and oriented to person, place, and time.  Psychiatric:        Mood and Affect: Mood normal.        Behavior: Behavior normal.     RECENT LABS AND TESTS: BMET    Component Value Date/Time   NA 145 (H) 10/16/2018 1150   K 4.2 10/16/2018 1150   CL 102 10/16/2018 1150   CO2 24 10/16/2018 1150   GLUCOSE 78 10/16/2018 1150   GLUCOSE 98 02/01/2016 1129   BUN 12 10/16/2018 1150   CREATININE 0.80 10/16/2018 1150   CALCIUM 9.6 10/16/2018  1150   GFRNONAA 89 10/16/2018 1150   GFRAA 102 10/16/2018 1150   Lab Results  Component Value Date   HGBA1C 5.0 10/16/2018   Lab Results  Component Value Date   INSULIN 3.6 10/16/2018   CBC    Component Value Date/Time   WBC 5.8 10/16/2018 1150   WBC 9.0 02/01/2016 1129   RBC 4.16 10/16/2018 1150   RBC 4.41 02/01/2016 1129   HGB 13.1 10/16/2018 1150   HCT 38.3 10/16/2018 1150   PLT 200 02/01/2016 1129   MCV 92 10/16/2018 1150   MCH 31.5 10/16/2018 1150   MCH 31.1 02/01/2016 1129   MCHC 34.2 10/16/2018 1150   MCHC 33.9 02/01/2016 1129   RDW 11.8 (L) 10/16/2018 1150   LYMPHSABS 2.0 10/16/2018 1150   MONOABS 0.3 10/12/2008 0830   EOSABS 0.0 10/16/2018 1150   BASOSABS 0.0 10/16/2018 1150   Iron/TIBC/Ferritin/ %Sat No results found for: IRON, TIBC, FERRITIN, IRONPCTSAT Lipid Panel     Component Value Date/Time   CHOL 159 10/16/2018 1150   TRIG 47 10/16/2018 1150   HDL 55 10/16/2018 1150   LDLCALC 95 10/16/2018 1150   Hepatic Function Panel     Component Value Date/Time   PROT 7.3 10/16/2018 1150   ALBUMIN 4.5 10/16/2018 1150   AST 33 10/16/2018 1150   ALT 26 10/16/2018 1150   ALKPHOS 56 10/16/2018 1150   BILITOT 0.3 10/16/2018 1150   BILIDIR 0.1 05/05/2008 0114   IBILI 0.5 05/05/2008 0114      Component Value Date/Time   TSH 2.160 10/16/2018 1150     Ref. Range 05/05/2019 11:28  Vitamin D, 25-Hydroxy Latest Ref Range: 30.0 - 100.0 ng/mL 46.9    OBESITY BEHAVIORAL INTERVENTION VISIT  Today's visit was # 19   Starting weight: 191 lbs Starting date: 10/16/18 Today's weight : Weight: 178 lb (80.7 kg)  Today's date: 08/21/19 Total lbs lost to date: 13 lbs At least 15 minutes were spent on discussing the following behavioral intervention visit.   ASK: We discussed the diagnosis of  obesity with [name] today and [name] agreed to give [place] permission to discuss obesity behavioral modification therapy today.  ASSESS: [name] has the diagnosis of  obesity and her BMI today is 30.54 [name] is in the action stage of change   ADVISE: Saffiyah was educated on the multiple health risks of obesity as well as the benefit of weight loss to improve her health. She was advised of the need for long term treatment and the importance of lifestyle modifications to improve her current health and to decrease her risk of future health problems.  AGREE: Multiple dietary modification options and treatment options were discussed and  [name] agreed to follow the recommendations documented in the above note.  ARRANGE: [name] was educated on the importance of frequent visits to treat obesity as outlined per CMS and USPSTF guidelines and agreed to schedule her next follow up appointment today.  I, [name], am acting as Location manager for [employer], [place].  I have reviewed the above documentation for accuracy and completeness, and I agree with the above.  - Sheila Roma, FNP-C.

## 2019-09-01 DIAGNOSIS — D2272 Melanocytic nevi of left lower limb, including hip: Secondary | ICD-10-CM | POA: Diagnosis not present

## 2019-09-01 DIAGNOSIS — L820 Inflamed seborrheic keratosis: Secondary | ICD-10-CM | POA: Diagnosis not present

## 2019-09-01 DIAGNOSIS — D2261 Melanocytic nevi of right upper limb, including shoulder: Secondary | ICD-10-CM | POA: Diagnosis not present

## 2019-09-01 DIAGNOSIS — D225 Melanocytic nevi of trunk: Secondary | ICD-10-CM | POA: Diagnosis not present

## 2019-09-01 DIAGNOSIS — B078 Other viral warts: Secondary | ICD-10-CM | POA: Diagnosis not present

## 2019-09-01 DIAGNOSIS — B0089 Other herpesviral infection: Secondary | ICD-10-CM | POA: Diagnosis not present

## 2019-09-01 DIAGNOSIS — L821 Other seborrheic keratosis: Secondary | ICD-10-CM | POA: Diagnosis not present

## 2019-09-01 MED FILL — valACYclovir HCL 1 GM TABS: 1 | 5 days supply | Qty: 20 | Fill #0

## 2019-09-01 MED FILL — MUPIROCIN 2% OINTMENT: 2 | 7 days supply | Qty: 22 | Fill #0

## 2019-09-08 NOTE — Progress Notes (Signed)
Office: [phone]  /  Fax: [phone]    Date: September 10, 2019   Appointment Start Time:  2:30pm Duration: 23 minutes Provider: Glennie Isle, Psy.D. Type of Session: Individual Therapy  Location of Patient: Home Location of Provider: Provider's Home Type of Contact: Telepsychological Visit via [employer]   Session Content: [name] is a 48 y.o. female presenting via [place] for a follow-up appointment to address the previously established treatment goal of decreasing emotional eating. Today's appointment was a telepsychological visit, as it is an option for appointments to reduce exposure to COVID-19. [name] expressed understanding regarding the rationale for telepsychological services, and provided verbal consent for today's appointment. Prior to proceeding with today's appointment, [name] physical location at the time of this appointment was obtained. [name] reported she was at home and provided the address. In the event of technical difficulties, [name] shared a phone number she could be reached at. [name] and this provider participated in today's telepsychological service. Also, [name] denied anyone else being present outside with her or on the WebEx appointment.  This provider conducted a brief check-in and verbally administered the PHQ-9 and GAD-7. [name] shared about recent events, which included camping with her family. She noted a reduction in news consumption, which she described as being a positive change. Regarding eating, [name] noted, "I think I'm doing pretty good." She reported "getting off [her] routine" triggers emotional eating, but she described a reduction in emotional eating overall. Psychoeducation regarding mindfulness was provided to assist with coping. A handout was provided to [name] with further information regarding mindfulness, including exercises. This provider also explained the benefit of mindfulness as it relates to emotional eating. [name]  was encouraged to engage in the provided exercises between now and the next appointment with this provider. [name] agreed. During today's appointment, [name] was led through a mindfulness exercise involving her senses. [name] provided verbal consent during today's appointment for this provider to send the handout about mindfulness via e-mail.  [name] was receptive to today's session as evidenced by openness to sharing, responsiveness to feedback, and willingness to engage in mindfulness exercises.  Mental Status Examination:  Appearance: neat Behavior: cooperative Mood: euthymic Affect: mood congruent Speech: normal in rate, volume, and tone Eye Contact: appropriate Psychomotor Activity: appropriate Thought Process: linear, logical, and goal directed  Content/Perceptual Disturbances: no hallucinations, delusions, bizarre thinking or behavior reported or observed and no evidence of suicidal and homicidal ideation, plan, and intent Orientation: time, person, place and purpose of appointment Cognition/Sensorium: memory, attention, language, and fund of knowledge intact  Insight: good Judgment: good  Structured Assessment Results: The Patient Health Questionnaire-9 (PHQ-9) is a self-report measure that assesses symptoms and severity of depression over the course of the last two weeks. [name] obtained a score of 1 suggesting minimal depression. [name] finds the endorsed symptoms to be somewhat difficult. Little interest or pleasure in doing things 0  Feeling down, depressed, or hopeless 0  Trouble falling or staying asleep, or sleeping too much 0  Feeling tired or having little energy 0  Poor appetite or overeating 1  Feeling bad about yourself --- or that you are a failure or have let yourself or your family down 0  Trouble concentrating on things, such as reading the newspaper or watching television 0  Moving or speaking so slowly that other people could have noticed? Or the opposite ---  being so fidgety or restless that you have been moving around a lot more than usual 0  Thoughts that you would  be better off dead or hurting yourself in some way 0  PHQ-9 Score 1    The Generalized Anxiety Disorder-7 (GAD-7) is a brief self-report measure that assesses symptoms of anxiety over the course of the last two weeks. [name] obtained a score of 2 suggesting minimal anxiety. [name] finds the endorsed symptoms to be somewhat difficult. Feeling nervous, anxious, on edge 0  Not being able to stop or control worrying 1  Worrying too much about different things 1  Trouble relaxing 0  Being so restless that it's hard to sit still 0  Becoming easily annoyed or irritable 0  Feeling afraid as if something awful might happen 0  GAD-7 Score 2   Interventions:  Conducted a brief chart review Verbal administration of PHQ-9 and GAD-7 for symptom monitoring Provided empathic reflections and validation Reviewed content from the previous session Psychoeducation provided regarding mindfulness Engaged patient in a mindfulness exercise Employed supportive psychotherapy interventions to facilitate reduced distress, and to improve coping skills with identified stressors Employed acceptance and commitment interventions to emphasize mindfulness and acceptance without struggle  DSM-5 Diagnosis: 311 (F32.8) Other Specified Depressive Disorder, Emotional Eating Behaviors  Treatment Goal & Progress: During the initial appointment with this provider, the following treatment goal was established: decrease emotional eating. [name] has demonstrated progress in her goal as evidenced by increased awareness of hunger patterns and triggers for emotional eating. [name] also reported a reduction in emotional eating and demonstrates willingness to engage in mindfulness exercises.  Plan: [name] continues to appear able and willing to participate as evidenced by engagement in reciprocal conversation, and asking  questions for clarification as appropriate. The next appointment will be scheduled in three weeks, which will be via [employer]. The next session will focus further on mindfulness.

## 2019-09-10 ENCOUNTER — Ambulatory Visit (INDEPENDENT_AMBULATORY_CARE_PROVIDER_SITE_OTHER): Payer: 59 | Admitting: Psychology

## 2019-09-10 ENCOUNTER — Other Ambulatory Visit: Payer: Self-pay

## 2019-09-10 DIAGNOSIS — F3289 Other specified depressive episodes: Secondary | ICD-10-CM

## 2019-09-11 DIAGNOSIS — Z0000 Encounter for general adult medical examination without abnormal findings: Secondary | ICD-10-CM | POA: Diagnosis not present

## 2019-09-11 DIAGNOSIS — G43009 Migraine without aura, not intractable, without status migrainosus: Secondary | ICD-10-CM | POA: Diagnosis not present

## 2019-09-11 DIAGNOSIS — E559 Vitamin D deficiency, unspecified: Secondary | ICD-10-CM | POA: Diagnosis not present

## 2019-09-11 DIAGNOSIS — D649 Anemia, unspecified: Secondary | ICD-10-CM | POA: Diagnosis not present

## 2019-09-11 DIAGNOSIS — Z13220 Encounter for screening for lipoid disorders: Secondary | ICD-10-CM | POA: Diagnosis not present

## 2019-09-11 DIAGNOSIS — K219 Gastro-esophageal reflux disease without esophagitis: Secondary | ICD-10-CM | POA: Diagnosis not present

## 2019-09-15 ENCOUNTER — Ambulatory Visit (INDEPENDENT_AMBULATORY_CARE_PROVIDER_SITE_OTHER): Payer: 59 | Admitting: Family Medicine

## 2019-09-15 ENCOUNTER — Other Ambulatory Visit: Payer: Self-pay

## 2019-09-15 VITALS — BP 95/62 | HR 69 | Temp 97.9°F | Ht 64.0 in | Wt 180.0 lb

## 2019-09-15 DIAGNOSIS — Z9189 Other specified personal risk factors, not elsewhere classified: Secondary | ICD-10-CM | POA: Diagnosis not present

## 2019-09-15 DIAGNOSIS — Z6830 Body mass index (BMI) 30.0-30.9, adult: Secondary | ICD-10-CM | POA: Diagnosis not present

## 2019-09-15 DIAGNOSIS — F3289 Other specified depressive episodes: Secondary | ICD-10-CM

## 2019-09-15 DIAGNOSIS — E559 Vitamin D deficiency, unspecified: Secondary | ICD-10-CM

## 2019-09-15 DIAGNOSIS — E669 Obesity, unspecified: Secondary | ICD-10-CM

## 2019-09-17 NOTE — Progress Notes (Signed)
Office: [phone]  /  Fax: [phone]   HPI:   Chief Complaint: OBESITY [name] is here to discuss her progress with her obesity treatment plan. She is on the keep a food journal with 1500 calories and 90 grams of protein daily plan and is following her eating plan approximately 70 % of the time. She states she is doing cross training, strength training and cardio exercise 30 to 45 minutes 5 times per week. [name] is not journaling consistently. She has missed 7 days over the past 3 weeks. She has gotten to her protein goals most of the time. She has been camping which has gotten her somewhat off track since they always have s'mores.. Her mother has also been visiting which has increased her eating due to stress.  Her weight is 180 lb (81.6 kg) today and has had a weight gain of 2 pounds over a period of 3 weeks since her last visit. She has lost 11 lbs since starting treatment with [place].  Vitamin D deficiency [name] has a diagnosis of vitamin D deficiency. Her vitamin D level has decreased down to 43 at her PCP visit. [name] has been on OTC vit D 2,000 IU daily and she denies nausea, vomiting or muscle weakness.  At risk for osteopenia and osteoporosis [name] is at higher risk of osteopenia and osteoporosis due to vitamin D deficiency.   Depression with emotional eating behaviors [name] has increased stress eating but she feels the Bupropion helps. She struggles with emotional eating and using food for comfort to the extent that it is negatively impacting her health. She often snacks when she is not hungry. [name] sometimes feels she is out of control and then feels guilty that she made poor food choices. She has been working on behavior modification techniques to help reduce her emotional eating and has been somewhat successful. She shows no sign of suicidal or homicidal ideations.  ASSESSMENT AND PLAN:  Vitamin D deficiency  Other depression - with emotional eating  At risk  for osteoporosis  Class 1 obesity with serious comorbidity and body mass index (BMI) of 30.0 to 30.9 in adult, unspecified obesity type  PLAN:  Vitamin D Deficiency Aadhira was informed that low vitamin D levels contributes to fatigue and are associated with obesity, breast, and colon cancer. [name] agrees to take OTC Vit D 4,000 IU daily and she will follow up for routine testing of vitamin D, at least 2-3 times per year. She was informed of the risk of over-replacement of vitamin D and agrees to not increase her dose unless she discusses this with [place] first. [name] agrees to follow up with our clinic in 3 weeks.  At risk for osteopenia and osteoporosis [name] was given extended (15 minutes) osteoporosis prevention counseling today. [name] is at risk for osteopenia and osteoporosis due to her vitamin D deficiency. She was encouraged to take her vitamin D and follow her higher calcium diet and increase strengthening exercise to help strengthen her bones and decrease her risk of osteopenia and osteoporosis.  Depression with Emotional Eating Behaviors We discussed behavior modification techniques today to help [name] deal with her emotional eating and depression. She will continue to take Bupropion (Wellbutrin SR) 150 mg daily and follow up as directed.  Obesity [name] is currently in the action stage of change. As such, her goal is to continue with weight loss efforts She has agreed to follow the Category 3 plan [name] will continue cross training, strength training and cardio exercise  30 to 45 minutes, 5 times per week for weight loss and overall health benefits. We discussed the following Behavioral Modification Strategies today: planning for success and decreasing simple carbohydrates   We discussed the low carb plan today and plan was provided to patient. She decided on the Category 3 plan for now.  [name] has agreed to follow up with our clinic in 3 weeks. She was informed of the  importance of frequent follow up visits to maximize her success with intensive lifestyle modifications for her multiple health conditions.  ALLERGIES: No Known Allergies  MEDICATIONS: Current Outpatient Medications on File Prior to Visit  Medication Sig Dispense Refill  . buPROPion (WELLBUTRIN SR) 150 MG 12 hr tablet Take 1 tablet (150 mg total) by mouth daily. 30 tablet 0  . Vitamin D, Cholecalciferol, 50 MCG (2000 UT) CAPS Take 1 capsule by mouth daily.     No current facility-administered medications on file prior to visit.     PAST MEDICAL HISTORY: Past Medical History:  Diagnosis Date  . Anemia   . Back pain   . Depression    followed by [name]  . Gallbladder problem   . GERD (gastroesophageal reflux disease)   . History of kidney problems    1975, eval by 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 Kidney problem   . Migraines    [facility]  . Palpitations   . Vitamin D deficiency     PAST SURGICAL HISTORY: Past Surgical History:  Procedure Laterality Date  . CESAREAN SECTION  2007 & 2009  . CHOLECYSTECTOMY  2009  . CYSTOSCOPY  age 8  . SHOULDER ARTHROSCOPY WITH SUBACROMIAL DECOMPRESSION, ROTATOR CUFF REPAIR AND BICEP TENDON REPAIR Right 02/11/2016   Procedure: RIGHT SHOULDER ARTHROSCOPY WITH SUBACROMIAL DECOMPRESSION, OPEN DCR,  OPEN MINI ROTATOR CUFF REPAIR, LABRAL REPAIR;  Surgeon: Netta Cedars, MD;  Location: [facility];  Service: Orthopedics;  Laterality: Right;  . TUBAL LIGATION    . uterine ablation      SOCIAL HISTORY: Social History   Tobacco Use  . Smoking status: Former Research scientist (life sciences)  . Smokeless tobacco: Never Used  Substance Use Topics  . Alcohol use: Yes    Comment: rare  . Drug use: No    FAMILY HISTORY: Family History  Problem Relation Age of Onset  .  Hyperlipidemia Mother   . Depression Mother   . Cancer Mother   . Anxiety disorder Mother   . Obesity Mother   . Migraines Father   . Hypertension Father   . Hyperlipidemia Father   . Cancer Father   . Bipolar disorder Sister   . Diabetes Paternal Grandfather   . Heart disease Paternal Grandfather     ROS: Review of Systems  Constitutional: Negative for weight loss.  Gastrointestinal: Negative for nausea and vomiting.  Musculoskeletal:       Negative for muscle weakness  Psychiatric/Behavioral: Positive for depression. Negative for suicidal ideas.    PHYSICAL EXAM: Blood pressure 95/62, pulse 69, temperature 97.9 F (36.6 C), temperature source Oral, height 5\' 4"  (1.626 m), weight 180 lb (81.6 kg), last menstrual period 09/08/2019, SpO2 97 %. Body mass index is 30.9 kg/m. Physical Exam Vitals signs reviewed.  Constitutional:      Appearance: Normal appearance. She is well-developed. She is obese.  Cardiovascular:     Rate and Rhythm: Normal rate.  Pulmonary:     Effort: Pulmonary effort is normal.  Musculoskeletal: Normal range of motion.  Skin:    General: Skin is warm and dry.  Neurological:     Mental Status: She is alert and oriented to person, place, and time.  Psychiatric:        Mood and Affect: Mood normal.        Behavior: Behavior normal.        Thought Content: Thought content does not include homicidal or suicidal ideation.     RECENT LABS AND TESTS: BMET    Component Value Date/Time   NA 145 (H) 10/16/2018 1150   K 4.2 10/16/2018 1150   CL 102 10/16/2018 1150   CO2 24 10/16/2018 1150   GLUCOSE 78 10/16/2018 1150   GLUCOSE 98 02/01/2016 1129   BUN 12 10/16/2018 1150   CREATININE 0.80 10/16/2018 1150   CALCIUM 9.6 10/16/2018 1150   GFRNONAA 89 10/16/2018 1150   GFRAA 102 10/16/2018 1150   Lab Results  Component Value Date   HGBA1C 5.0 10/16/2018   Lab Results  Component Value Date   INSULIN 3.6 10/16/2018   CBC    Component Value  Date/Time   WBC 5.8 10/16/2018 1150   WBC 9.0 02/01/2016 1129   RBC 4.16 10/16/2018 1150   RBC 4.41 02/01/2016 1129   HGB 13.1 10/16/2018 1150   HCT 38.3 10/16/2018 1150   PLT 200 02/01/2016 1129   MCV 92 10/16/2018 1150   MCH 31.5 10/16/2018 1150   MCH 31.1 02/01/2016 1129   MCHC 34.2 10/16/2018 1150   MCHC 33.9 02/01/2016 1129   RDW 11.8 (L) 10/16/2018 1150   LYMPHSABS 2.0 10/16/2018 1150   MONOABS 0.3 10/12/2008 0830   EOSABS 0.0 10/16/2018 1150   BASOSABS 0.0 10/16/2018 1150   Iron/TIBC/Ferritin/ %Sat No results found for: IRON, TIBC, FERRITIN, IRONPCTSAT Lipid Panel     Component Value Date/Time   CHOL 159 10/16/2018 1150   TRIG 47 10/16/2018 1150   HDL 55 10/16/2018 1150   LDLCALC 95 10/16/2018 1150   Hepatic Function Panel     Component Value Date/Time   PROT 7.3 10/16/2018 1150   ALBUMIN 4.5 10/16/2018 1150   AST 33 10/16/2018 1150   ALT 26 10/16/2018 1150   ALKPHOS 56 10/16/2018 1150   BILITOT 0.3 10/16/2018 1150   BILIDIR 0.1 05/05/2008 0114   IBILI 0.5 05/05/2008 0114      Component Value Date/Time   TSH 2.160 10/16/2018 1150     Ref. Range 05/05/2019 11:28  Vitamin D, 25-Hydroxy Latest Ref Range: 30.0 - 100.0 ng/mL 46.9    OBESITY BEHAVIORAL INTERVENTION VISIT  Today's visit was # 20   Starting weight: 191 lbs Starting date: 10/16/2018 Today's weight : 180 lbs Today's date: 09/15/2019 Total lbs lost to date: 11    09/15/2019  Height 5\' 4"  (1.626 m)  Weight 180 lb (81.6 kg)  BMI (Calculated) 30.88  BLOOD PRESSURE - SYSTOLIC 95  BLOOD PRESSURE - DIASTOLIC 62   Body Fat % [id number] %  Total Body Water (lbs) 75.4 lbs    ASK: We discussed the diagnosis of obesity with [name] today and [name] agreed to give [place] permission to discuss obesity behavioral modification therapy today.  ASSESS: [name] has the diagnosis of obesity and her BMI today is 30.88 [name] is in the action stage of change   ADVISE: [name] was educated on the  multiple health risks of obesity as well as the benefit of weight loss to improve her health. She was advised of the need for long term treatment and the importance of lifestyle modifications to improve her current health and to decrease her risk of future health problems.  AGREE: Multiple dietary modification options and treatment options were discussed and  Tangelia agreed to follow the recommendations documented in the above note.  ARRANGE: [name] was educated on the importance of frequent visits to treat obesity as outlined per CMS and USPSTF guidelines and agreed to schedule her next follow up appointment today.  Corey Skains, am acting as Location manager for [employer], FNP-C.  I have reviewed the above documentation for accuracy and completeness, and I agree with the above.  - Kamare Caspers, FNP-C.

## 2019-09-18 ENCOUNTER — Encounter (INDEPENDENT_AMBULATORY_CARE_PROVIDER_SITE_OTHER): Payer: Self-pay | Admitting: Family Medicine

## 2019-09-25 NOTE — Progress Notes (Signed)
Office: [phone]  /  Fax: [phone]    Date: October 02, 2019   Appointment Start Time: 10:29am Duration: 31 minutes Provider: Glennie [facility], Psy.D. Type of Session: Individual Therapy  Location of Patient: Home Location of Provider: [facility] Type of Contact: Telepsychological Visit via [employer] Web[employer]   Session Content: [name] is a 48 y.o. female presenting via [place] for a follow-up appointment to address the previously established treatment goal of decreasing emotional eating. Today's appointment was a telepsychological visit, as it is an option for appointments to reduce exposure to COVID-19. [name] expressed understanding regarding the rationale for telepsychological services, and provided verbal consent for today's appointment. Prior to proceeding with today's appointment, [name]'s physical location at the time of this appointment was obtained. In the event of technical difficulties, [name] shared a phone number she could be reached at. [name] and this provider participated in today's telepsychological service. Also, [name] denied anyone else being present in the room or on the WebEx appointment.  This provider conducted a brief check-in and verbally administered the PHQ-9 and GAD-7. [name] shared about ongoing stressors (e.g., election and concern about her son), but discussed engaging in discussed strategies (e.g., limiting news consumption). This provider recommended [name] utilize emergency resources should there be a concern about her son's safety. Notably, [name] shared, "This week has been better," as "he is trying." Options to establish care with a new provider, including [facility] contacting her insurance company for a list of in-network providers, exploring [email or web address], or this provider placing a referral were discussed as [name] expressed desire to meet with a provider to discuss concerns related to her son. [name] provided verbal  consent for this provider to place a referral to address parenting concerns/family dynamics and emotional eating. [name] of today's appointment focused on mindfulness. She shared she read the handout and "tried to do it more." She discussed sometimes she has to do it "really, really quick." Thus, psychoeducation regarding formal (e.g., setting aside a specific time daily to engage in an exercise) and informal (e.g., cultivating awareness in the present moment and taking a non-judgmental approach while engaging in day-to-day tasks) mindfulness was provided. This provider also discussed the utilization of YouTube for mindfulness exercises (e.g., videos by Merri Ray). Overall, [name] was receptive to today's session as evidenced by openness to sharing, responsiveness to feedback, and willingness to continue engaging in mindfulness exercises.  Mental Status Examination:  Appearance: neat Behavior: cooperative Mood: euthymic Affect: mood congruent Speech: normal in rate, volume, and tone Eye Contact: appropriate Psychomotor Activity: appropriate Thought Process: linear, logical, and goal directed  Content/Perceptual Disturbances: no hallucinations, delusions, bizarre thinking or behavior reported or observed and no evidence of suicidal and homicidal ideation, plan, and intent Orientation: time, person, place and purpose of appointment Cognition/Sensorium: memory, attention, language, and fund of knowledge intact  Insight: good Judgment: good  Structured Assessment Results: The Patient Health Questionnaire-9 (PHQ-9) is a self-report measure that assesses symptoms and severity of depression over the course of the last two weeks. [name] obtained a score of 3 suggesting minimal depression. [name] finds the endorsed symptoms to be somewhat difficult. Little interest or pleasure in doing things 0  Feeling down, depressed, or hopeless 1  Trouble falling or staying asleep, or sleeping too much 0   Feeling tired or having little energy 1  Poor appetite or overeating 1  Feeling bad about yourself --- or that you are a failure or have let yourself or your family down 0  Trouble concentrating on things, such as reading the newspaper or watching television 0  Moving or speaking so slowly that other people could have noticed? Or the opposite --- being so fidgety or restless that you have been moving around a lot more than usual 0  Thoughts that you would be better off dead or hurting yourself in some way 0  PHQ-9 Score 3    The Generalized Anxiety Disorder-7 (GAD-7) is a brief self-report measure that assesses symptoms of anxiety over the course of the last two weeks. [name] obtained a score of 8 suggesting mild anxiety. [name] finds the endorsed symptoms to be very difficult. Feeling nervous, anxious, on edge 1  Not being able to stop or control worrying 1  Worrying too much about different things 1  Trouble relaxing 1  Being so restless that it's hard to sit still 0  Becoming easily annoyed or irritable 3  Feeling afraid as if something awful might happen 1  GAD-7 Score 8   Interventions:  Conducted a brief chart review Verbal administration of PHQ-9 and GAD-7 for symptom monitoring Provided empathic reflections and validation Reviewed content from the previous session Processed thoughts and feelings Psychoeducation provided regarding mindfulness Employed motivational interviewing skills to assess patient's willingness/desire to adhere to recommended medical treatments and assignments Discussed option for a referral for longer-term therapeutic services Employed supportive psychotherapy interventions to facilitate reduced distress, and to improve coping skills with identified stressors  DSM-5 Diagnosis: 311 (F32.8) Other Specified Depressive Disorder, Emotional Eating Behaviors  Treatment Goal & Progress: During the initial appointment with this provider, the following treatment  goal was established: decrease emotional eating. [name] has demonstrated progress in her goal as evidenced by increased awareness of hunger patterns and triggers for emotional eating. Veeksha also continues to demonstrate willingness to engage in learned skill(s).  Plan: Denaysia continues to appear able and willing to participate as evidenced by engagement in reciprocal conversation, and asking questions for clarification as appropriate. The next appointment will be scheduled in two weeks, which will be via [employer]. The next session will focus on reviewing learned skills, and working towards the established treatment goal.

## 2019-10-01 ENCOUNTER — Other Ambulatory Visit (INDEPENDENT_AMBULATORY_CARE_PROVIDER_SITE_OTHER): Payer: Self-pay | Admitting: Family Medicine

## 2019-10-01 DIAGNOSIS — F3289 Other specified depressive episodes: Secondary | ICD-10-CM

## 2019-10-02 ENCOUNTER — Other Ambulatory Visit: Payer: Self-pay

## 2019-10-02 ENCOUNTER — Ambulatory Visit (INDEPENDENT_AMBULATORY_CARE_PROVIDER_SITE_OTHER): Payer: 59 | Admitting: Psychology

## 2019-10-02 DIAGNOSIS — F3289 Other specified depressive episodes: Secondary | ICD-10-CM | POA: Diagnosis not present

## 2019-10-02 MED ORDER — BUPROPION HCL ER (SR) 150 MG PO TB12: 150.0000 mg | ORAL_TABLET | Freq: Every day | ORAL | 0 refills | Status: DC

## 2019-10-02 MED FILL — BUPROPION HCL SR 150 MG TAB: 150 | 30 days supply | Qty: 30 | Fill #0

## 2019-10-06 NOTE — Progress Notes (Signed)
  Office: [phone]  /  Fax: [phone]    Date: October 16, 2019   Appointment Start Time: 12:30pm Duration: 29 minutes Provider: Glennie [facility], Psy.D. Type of Session: Individual Therapy  Location of Patient: Home Location of Provider: [facility] Type of Contact: Telepsychological Visit via [employer] WebEx   Session Content: [name] is a 48 y.o. female presenting via [name] for a follow-up appointment to address the previously established treatment goal of decreasing emotional eating. Today's appointment was a telepsychological visit, as it is an option for appointments to reduce exposure to COVID-19. [name] expressed understanding regarding the rationale for telepsychological services, and provided verbal consent for today's appointment. Prior to proceeding with today's appointment, [name]'s physical location at the time of this appointment was obtained. In the event of technical difficulties, [name] shared a phone number she could be reached at. [name] and this provider participated in today's telepsychological service. Also, [name] denied anyone else being present in the room or on the WebEx appointment.  This provider conducted a brief check-in. [name] described feeling as though she is on a "hamster wheel," but noted, "I'm feeling much, much better since last talking to you." She added, "My son's doing better." In addition, [name] shared she will be off for two weeks from work and will be going on vacation for the upcoming holiday. Moreover, [name] shared about ongoing frustration with work and COVID-19. Associated thoughts and feelings were processed. To further assist with coping, mindfulness was reviewed. [name] reported engaging in informal mindfulness while engaging in chores and reported it has helped reduce anxiety and stress. Positive reinforcement was provided. She was led through an exercise, "A Taste of Mindfulness." She noted, "That was very  nice." [name] provided verbal consent during today's appointment for this provider to send the handout for today's exercise via e-mail.  [name] was receptive to today's session as evidenced by openness to sharing, responsiveness to feedback, and willingness to engage in learned skills.  Mental Status Examination:  Appearance: neat Behavior: cooperative Mood: euthymic Affect: mood congruent Speech: normal in rate, volume, and tone Eye Contact: appropriate Psychomotor Activity: appropriate Thought Process: linear, logical, and goal directed  Content/Perceptual Disturbances: no hallucinations, delusions, bizarre thinking or behavior reported or observed and no evidence of suicidal and homicidal ideation, plan, and intent Orientation: time, person, place and purpose of appointment Cognition/Sensorium: memory, attention, language, and fund of knowledge intact  Insight: good Judgment: good  Interventions:  Conducted a brief chart review Provided empathic reflections and validation Reviewed content from the previous session Engaged patient in a mindfulness exercise Employed motivational interviewing skills to assess patient's willingness/desire to adhere to recommended medical treatments and assignments Employed supportive psychotherapy interventions to facilitate reduced distress, and to improve coping skills with identified stressors  DSM-5 Diagnosis: 311 (F32.8) Other Specified Depressive Disorder, Emotional Eating Behaviors  Treatment Goal & Progress: During the initial appointment with this provider, the following treatment goal was established: decrease emotional eating. [name] has demonstrated progress in her goal as evidenced by increased awareness of hunger patterns and triggers for emotional eating. [name] also continues to demonstrate willingness to engage in learned skill(s).  Plan: [name] continues to appear able and willing to participate as evidenced by engagement in  reciprocal conversation, and asking questions for clarification as appropriate. The next appointment will be scheduled in 3-4 weeks, which will be via [employer]. The next session will focus on reviewing learned skills, and working towards the established treatment goal.

## 2019-10-09 ENCOUNTER — Encounter (INDEPENDENT_AMBULATORY_CARE_PROVIDER_SITE_OTHER): Payer: Self-pay | Admitting: Family Medicine

## 2019-10-09 ENCOUNTER — Other Ambulatory Visit: Payer: Self-pay

## 2019-10-09 ENCOUNTER — Ambulatory Visit (INDEPENDENT_AMBULATORY_CARE_PROVIDER_SITE_OTHER): Payer: 59 | Admitting: Family Medicine

## 2019-10-09 VITALS — BP 96/64 | HR 71 | Temp 98.0°F | Ht 64.0 in | Wt 178.0 lb

## 2019-10-09 DIAGNOSIS — Z6830 Body mass index (BMI) 30.0-30.9, adult: Secondary | ICD-10-CM

## 2019-10-09 DIAGNOSIS — Z9189 Other specified personal risk factors, not elsewhere classified: Secondary | ICD-10-CM

## 2019-10-09 DIAGNOSIS — E669 Obesity, unspecified: Secondary | ICD-10-CM | POA: Diagnosis not present

## 2019-10-09 DIAGNOSIS — F3289 Other specified depressive episodes: Secondary | ICD-10-CM

## 2019-10-09 DIAGNOSIS — E559 Vitamin D deficiency, unspecified: Secondary | ICD-10-CM

## 2019-10-09 MED ORDER — BUPROPION HCL ER (SR) 150 MG PO TB12: 150.0000 mg | ORAL_TABLET | Freq: Every day | ORAL | 0 refills | Status: DC

## 2019-10-13 NOTE — Progress Notes (Signed)
Office: [phone]  /  Fax: [phone]   HPI:   Chief Complaint: OBESITY [name] is here to discuss her progress with her obesity treatment plan. She is on the keep a food journal with 1500 calories and 90 grams of protein daily and follow the Category 3 plan and is following her eating plan approximately 80 % of the time. She states she is doing cardio exercise and strength training 30 to 40 minutes 5 to 6 times per week. [name] is struggling with her rebellious 47 year old son which makes it more difficult to stick to the plan. However, she has done fairly well. She has been journaling more consistently when she is not doing the Category 3 plan. Her weight is 178 lb (80.7 kg) today and has had a weight loss of 2 pounds over a period of 3 to 4 weeks since her last visit. She has lost 13 lbs since starting treatment with [place].  Vitamin D deficiency [name] has a diagnosis of vitamin D deficiency. She is currently taking OTC vit D 4,000 IU daily. Her last vitamin D level was at 46.9 at the last check and was at goal. [name] denies nausea, vomiting or muscle weakness.  At risk for osteopenia and osteoporosis [name] is at higher risk of osteopenia and osteoporosis due to vitamin D deficiency.   Depression with emotional eating behaviors  Her stress eating has been fairly well controlled. She is seeing Dr. Mallie Mussel. She shows no sign of suicidal or homicidal ideations.  ASSESSMENT AND PLAN:  Vitamin D deficiency  Other depression - with emotional eating  - Plan: buPROPion (WELLBUTRIN SR) 150 MG 12 hr tablet  At risk for osteoporosis  Class 1 obesity with serious comorbidity and body mass index (BMI) of 30.0 to 30.9 in adult, unspecified obesity type  PLAN:  Vitamin D Deficiency [name] was informed that low vitamin D levels contributes to fatigue and are associated with obesity, breast, and colon cancer. Analyce will continue to take OTC Vit D @4 ,000 IU daily and she will follow up  for routine testing of vitamin D, at least 2-3 times per year. She was informed of the risk of over-replacement of vitamin D and agrees to not increase her dose unless she discusses this with [place] first.  At risk for osteopenia and osteoporosis [name] was given extended  (15 minutes) osteoporosis prevention counseling today. [name] is at risk for osteopenia and osteoporosis due to her vitamin D deficiency. She was encouraged to take her vitamin D and follow her higher calcium diet and increase strengthening exercise to help strengthen her bones and decrease her risk of osteopenia and osteoporosis.  Depression with Emotional Eating Behaviors We discussed behavior modification techniques today to help [name] deal with her emotional eating and depression. She has agreed to continue Bupropion 150 mg qAM #30 with no refills and follow up as directed.  Obesity [name] is currently in the action stage of change. As such, her goal is to continue with weight loss efforts She has agreed to keep a food journal with 1500 calories and 90 grams of protein daily or follow the Category 3 plan [name] will continue her current exercise regimen for weight loss and overall health benefits. We discussed the following Behavioral Modification Strategies today: planning for success and work on meal planning and easy cooking plans  Handouts for recipes were given to patient today.  [name] has agreed to follow up with our clinic in 3 weeks. She was informed of the  importance of frequent follow up visits to maximize her success with intensive lifestyle modifications for her multiple health conditions.  ALLERGIES: No Known Allergies  MEDICATIONS: Current Outpatient Medications on File Prior to Visit  Medication Sig Dispense Refill  . Vitamin D, Cholecalciferol, 50 MCG (2000 UT) CAPS Take 1 capsule by mouth daily.     No current facility-administered medications on file prior to visit.     PAST MEDICAL HISTORY:  Past Medical History:  Diagnosis Date  . Anemia   . Back pain   . Depression    followed by [name]  . Gallbladder problem   . GERD (gastroesophageal reflux disease)   . History of kidney problems    1975, eval by 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 Kidney problem   . Migraines    [facility]  . Palpitations   . Vitamin D deficiency     PAST SURGICAL HISTORY: Past Surgical History:  Procedure Laterality Date  . CESAREAN SECTION  2007 & 2009  . CHOLECYSTECTOMY  2009  . CYSTOSCOPY  age 31  . SHOULDER ARTHROSCOPY WITH SUBACROMIAL DECOMPRESSION, ROTATOR CUFF REPAIR AND BICEP TENDON REPAIR Right 02/11/2016   Procedure: RIGHT SHOULDER ARTHROSCOPY WITH SUBACROMIAL DECOMPRESSION, OPEN DCR,  OPEN MINI ROTATOR CUFF REPAIR, LABRAL REPAIR;  Surgeon: Netta Cedars, MD;  Location: [facility];  Service: Orthopedics;  Laterality: Right;  . TUBAL LIGATION    . uterine ablation      SOCIAL HISTORY: Social History   Tobacco Use  . Smoking status: Former Research scientist (life sciences)  . Smokeless tobacco: Never Used  Substance Use Topics  . Alcohol use: Yes    Comment: rare  . Drug use: No    FAMILY HISTORY: Family History  Problem Relation Age of Onset  . Hyperlipidemia Mother   . Depression Mother   . Cancer Mother   . Anxiety disorder Mother   . Obesity Mother   . Migraines Father   . Hypertension Father   . Hyperlipidemia Father   . Cancer Father   . Bipolar disorder Sister   . Diabetes Paternal Grandfather   . Heart disease Paternal Grandfather     ROS: Review of Systems  Constitutional: Positive for weight loss.  Gastrointestinal: Negative for nausea and vomiting.  Musculoskeletal:       Negative for muscle weakness  Psychiatric/Behavioral: Positive for depression. Negative for suicidal  ideas.    PHYSICAL EXAM: Blood pressure 96/64, pulse 71, temperature 98 F (36.7 C), temperature source Oral, height 5\' 4"  (1.626 m), weight 178 lb (80.7 kg), last menstrual period 10/03/2019, SpO2 97 %. Body mass index is 30.55 kg/m. Physical Exam Vitals signs reviewed.  Constitutional:      Appearance: Normal appearance. She is well-developed. She is obese.  Cardiovascular:     Rate and Rhythm: Normal rate.  Pulmonary:     Effort: Pulmonary effort is normal.  Musculoskeletal: Normal range of motion.  Skin:    General: Skin is warm and dry.  Neurological:     Mental Status: She is alert and oriented to person, place, and time.  Psychiatric:        Mood and Affect: Mood normal.        Behavior: Behavior normal.        Thought Content: Thought content does not include  homicidal or suicidal ideation.     RECENT LABS AND TESTS: BMET    Component Value Date/Time   NA 145 (H) 10/16/2018 1150   K 4.2 10/16/2018 1150   CL 102 10/16/2018 1150   CO2 24 10/16/2018 1150   GLUCOSE 78 10/16/2018 1150   GLUCOSE 98 02/01/2016 1129   BUN 12 10/16/2018 1150   CREATININE 0.80 10/16/2018 1150   CALCIUM 9.6 10/16/2018 1150   GFRNONAA 89 10/16/2018 1150   GFRAA 102 10/16/2018 1150   Lab Results  Component Value Date   HGBA1C 5.0 10/16/2018   Lab Results  Component Value Date   INSULIN 3.6 10/16/2018   CBC    Component Value Date/Time   WBC 5.8 10/16/2018 1150   WBC 9.0 02/01/2016 1129   RBC 4.16 10/16/2018 1150   RBC 4.41 02/01/2016 1129   HGB 13.1 10/16/2018 1150   HCT 38.3 10/16/2018 1150   PLT 200 02/01/2016 1129   MCV 92 10/16/2018 1150   MCH 31.5 10/16/2018 1150   MCH 31.1 02/01/2016 1129   MCHC 34.2 10/16/2018 1150   MCHC 33.9 02/01/2016 1129   RDW 11.8 (L) 10/16/2018 1150   LYMPHSABS 2.0 10/16/2018 1150   MONOABS 0.3 10/12/2008 0830   EOSABS 0.0 10/16/2018 1150   BASOSABS 0.0 10/16/2018 1150   Iron/TIBC/Ferritin/ %Sat No results found for: IRON, TIBC,  FERRITIN, IRONPCTSAT Lipid Panel     Component Value Date/Time   CHOL 159 10/16/2018 1150   TRIG 47 10/16/2018 1150   HDL 55 10/16/2018 1150   LDLCALC 95 10/16/2018 1150   Hepatic Function Panel     Component Value Date/Time   PROT 7.3 10/16/2018 1150   ALBUMIN 4.5 10/16/2018 1150   AST 33 10/16/2018 1150   ALT 26 10/16/2018 1150   ALKPHOS 56 10/16/2018 1150   BILITOT 0.3 10/16/2018 1150   BILIDIR 0.1 05/05/2008 0114   IBILI 0.5 05/05/2008 0114      Component Value Date/Time   TSH 2.160 10/16/2018 1150     Ref. Range 05/05/2019 11:28  Vitamin D, 25-Hydroxy Latest Ref Range: 30.0 - 100.0 ng/mL 46.9    OBESITY BEHAVIORAL INTERVENTION VISIT  Today's visit was # 21   Starting weight: 191 lbs Starting date: 10/16/2018 Today's weight : 178 lbs Today's date: 10/09/2019 Total lbs lost to date: 13    10/09/2019  Height 5\' 4"  (1.626 m)  Weight 178 lb (80.7 kg)  BMI (Calculated) 30.54  BLOOD PRESSURE - SYSTOLIC 96  BLOOD PRESSURE - DIASTOLIC 64   Body Fat % 0000000 %  Total Body Water (lbs) 75.4 lbs    ASK: We discussed the diagnosis of obesity with Myrtie Cruise today and [name] agreed to give [place] permission to discuss obesity behavioral modification therapy today.  ASSESS: Naziyah has the diagnosis of obesity and her BMI today is 30.54 [name] is in the action stage of change   ADVISE: [name] was educated on the multiple health risks of obesity as well as the benefit of weight loss to improve her health. She was advised of the need for long term treatment and the importance of lifestyle modifications to improve her current health and to decrease her risk of future health problems.  AGREE: Multiple dietary modification options and treatment options were discussed and  [place] agreed to follow the recommendations documented in the above note.  ARRANGE: [name] was educated on the importance of frequent visits to treat obesity as outlined per CMS and USPSTF  guidelines and agreed  to schedule her next follow up appointment today.  Corey Skains, am acting as Location manager for [employer], FNP-C.  I have reviewed the above documentation for accuracy and completeness, and I agree with the above.  - Anish Vana, FNP-C.

## 2019-10-14 ENCOUNTER — Encounter (INDEPENDENT_AMBULATORY_CARE_PROVIDER_SITE_OTHER): Payer: Self-pay | Admitting: Family Medicine

## 2019-10-14 DIAGNOSIS — Z20828 Contact with and (suspected) exposure to other viral communicable diseases: Secondary | ICD-10-CM | POA: Diagnosis not present

## 2019-10-16 ENCOUNTER — Ambulatory Visit (INDEPENDENT_AMBULATORY_CARE_PROVIDER_SITE_OTHER): Payer: 59 | Admitting: Psychology

## 2019-10-16 ENCOUNTER — Other Ambulatory Visit: Payer: Self-pay

## 2019-10-16 DIAGNOSIS — F3289 Other specified depressive episodes: Secondary | ICD-10-CM | POA: Diagnosis not present

## 2019-10-27 NOTE — Progress Notes (Signed)
Office: [phone]  /  Fax: [phone]    Date: November 10, 2019   Appointment Start Time: 10:30am Duration: 25 minutes Provider: Glennie Isle, Psy.D. Type of Session: Individual Therapy  Location of Patient: Parked in car at [employer] station (charging car) Location of Provider: [facility] Type of Contact: Telepsychological Visit via [employer]  Session Content: [name] is a 48 y.o. female presenting via [name] for a follow-up appointment to address the previously established treatment goal of decreasing emotional eating. Today's appointment was a telepsychological visit due to COVID-19. [name] provided verbal consent for today's telepsychological appointment and she is aware she is responsible for securing confidentiality on her end of the session. Prior to proceeding with today's appointment, [name]'s physical location at the time of this appointment was obtained as well a phone number she could be reached at in the event of technical difficulties. [name] and this provider participated in today's telepsychological service.   This provider conducted a brief check-in. [name] shared about recent events and noted, "Things have been going pretty good." She also shared about her first appointment with her new provider at [name]. Their next appointment is in January 2021. [name] further shared "things have gotten better" with her son and noted a plan to establish him with a therapist. Regarding eating, [name] recalled "a handful of days" where she engaged in emotional eating, but added, "I can recognize it quicker and stop it." She described engagement in portion control and making better choices. Positive reinforcement was provided. A plan was developed to help [name] cope with emotional eating secondary to stress and out of habit using learned skills. She wrote down the following plan: focus on hydration, be prepared with snacks congruent  to the meal plan, pause to ask questions when triggered to eat (e.g., Am I really hungry?; Is there something bothering me? Will I feel better if I eat?), and engage in  coping skills after going through the aforementioned questions. Overall, [name] was receptive to today's appointment as evidenced by openness to sharing, responsiveness to feedback, and willingness to engage in learned skills.  Mental Status Examination:  Appearance: well groomed and appropriate hygiene  Behavior: appropriate to circumstances Mood: euthymic Affect: mood congruent Speech: normal in rate, volume, and tone Eye Contact: appropriate Psychomotor Activity: appropriate Gait: unable to assess Thought Process: linear, logical, and goal directed  Thought Content/Perception: no hallucinations, delusions, bizarre thinking or behavior reported or observed and no evidence of suicidal and homicidal ideation, plan, and intent Orientation: time, person, place and purpose of appointment Memory/Concentration: memory, attention, language, and fund of knowledge intact  Insight/Judgment: good  Interventions:  Conducted a brief chart review Provided empathic reflections and validation Reviewed content from the previous session Provided positive reinforcement Employed supportive psychotherapy interventions to facilitate reduced distress and to improve coping skills with identified stressors Employed motivational interviewing skills to assess patient's willingness/desire to adhere to recommended medical treatments and assignments Reviewed learned skills  DSM-5 Diagnosis: 311 (F32.8) Other Specified Depressive Disorder, Emotional Eating Behaviors  Treatment Goal & Progress: During the initial appointment with this provider, the following treatment goal was established: decrease emotional eating. [name] demonstrated progress in her goal as evidenced by increased awareness of hunger patterns, identification of triggers for  emotional eating and reduction in emotional eating. [name] also continues to demonstrate willingness to engage in learned skill(s).  Plan: As previously planned, today was [name]'s last appointment with this provider. She acknowledged understanding that she may  request a follow-up appointment with this provider in the future as long as she is still established with the clinic. No further follow-up planned by this provider.

## 2019-11-03 ENCOUNTER — Other Ambulatory Visit: Payer: Self-pay

## 2019-11-03 ENCOUNTER — Ambulatory Visit (INDEPENDENT_AMBULATORY_CARE_PROVIDER_SITE_OTHER): Payer: 59 | Admitting: Family Medicine

## 2019-11-03 ENCOUNTER — Encounter (INDEPENDENT_AMBULATORY_CARE_PROVIDER_SITE_OTHER): Payer: Self-pay | Admitting: Family Medicine

## 2019-11-03 VITALS — BP 106/65 | HR 68 | Temp 98.1°F | Ht 64.0 in | Wt 179.0 lb

## 2019-11-03 DIAGNOSIS — E559 Vitamin D deficiency, unspecified: Secondary | ICD-10-CM

## 2019-11-03 DIAGNOSIS — Z6830 Body mass index (BMI) 30.0-30.9, adult: Secondary | ICD-10-CM | POA: Diagnosis not present

## 2019-11-03 DIAGNOSIS — F3289 Other specified depressive episodes: Secondary | ICD-10-CM

## 2019-11-03 DIAGNOSIS — E669 Obesity, unspecified: Secondary | ICD-10-CM | POA: Diagnosis not present

## 2019-11-03 DIAGNOSIS — Z9189 Other specified personal risk factors, not elsewhere classified: Secondary | ICD-10-CM

## 2019-11-03 MED ORDER — BUPROPION HCL ER (SR) 150 MG PO TB12: 150.0000 mg | ORAL_TABLET | Freq: Every day | ORAL | 0 refills | Status: DC

## 2019-11-03 MED FILL — BUPROPION HCL SR 150 MG TAB: 150 | 30 days supply | Qty: 30 | Fill #0

## 2019-11-04 NOTE — Progress Notes (Signed)
Office: [phone]  /  Fax: [phone]   HPI:   Chief Complaint: OBESITY [name] is here to discuss her progress with her obesity treatment plan. She is on the keep a food journal with 1500 calories and 90 grams of protein daily and is following her eating plan approximately 70 % of the time. She states she is doing cardio exercise, yoga and strength training 30 to 40 minutes 5 to 6 times per week. [name] is having her menstrual cycle and she feels very bloated. She feels she is retaining fluid. [name] took a trip over Thanksgiving and she did indulge some, but she also did a lot of hiking. She is journaling daily and she is hitting her protein and calorie goal. Her weight is 179 lb (81.2 kg) today and has had a weight gain of 1 pound over a period of 3 weeks since her last visit. She has lost 12 lbs since starting treatment with [place].  Vitamin D deficiency [name] has a diagnosis of vitamin D deficiency. She is nearly at goal on 4,000 IU OTC vit D. [name] denies nausea, vomiting or muscle weakness.  At risk for osteopenia and osteoporosis [name] is at higher risk of osteopenia and osteoporosis due to vitamin D deficiency.   Depression with emotional eating behaviors [name]'s mood is stable, however she is very stressed, due to her 9 year old son acting up. She is seeing Dr. Mallie Mussel and she will start at [facility] counseling too. [name] struggles with emotional eating and using food for comfort to the extent that it is negatively impacting her health. She often snacks when she is not hungry. [name] sometimes feels she is out of control and then feels guilty that she made poor food choices. She has been working on behavior modification techniques to help reduce her emotional eating and has been somewhat successful. She shows no sign of suicidal or homicidal ideations.  ASSESSMENT AND PLAN:  Vitamin D deficiency  Other depression - with emotional eating  - Plan: buPROPion (WELLBUTRIN  SR) 150 MG 12 hr tablet  At risk for osteoporosis  Class 1 obesity with serious comorbidity and body mass index (BMI) of 30.0 to 30.9 in adult, unspecified obesity type  PLAN:  Vitamin D Deficiency Low vitamin D level contributes to fatigue and are associated with obesity, breast, and colon cancer. [name] will continue to take OTC Vit D @ 4,000 IU daily and she will follow up for routine testing of vitamin D, at least 2-3 times per year to avoid over-replacement.  At risk for osteopenia and osteoporosis [name] was given extended  (15 minutes) osteoporosis prevention counseling today. Darely is at risk for osteopenia and osteoporosis due to her vitamin D deficiency. She was encouraged to take her vitamin D and follow her higher calcium diet and increase strengthening exercise to help strengthen her bones and decrease her risk of osteopenia and osteoporosis.  Emotional Eating Behaviors (other depression) Behavior modification techniques were discussed today to help [name] deal with her emotional/non-hunger eating behaviors. [name] will continue with counseling. She agrees to continue Bupropion 150 mg qAM #30 with no refills and follow up as directed. Will continue to follow and monitor her progress.   Obesity [name] is currently in the action stage of change. As such, her goal is to continue with weight loss efforts She has agreed to keep a food journal with 1500 calories and 90 grams of protein daily [name] will continue cardio exercise, yoga and strength training for 30 to  40 minutes, 5 to 6 times per week for weight loss and overall health benefits. We discussed the following Behavioral Modification Strategies today: planning for success, keep a strict food journal, increasing lean protein intake and decreasing simple carbohydrates   [name] has agreed to follow up with our clinic in 4 weeks. She was informed of the importance of frequent follow up visits to maximize her success with  intensive lifestyle modifications for her multiple health conditions.  ALLERGIES: No Known Allergies  MEDICATIONS: Current Outpatient Medications on File Prior to Visit  Medication Sig Dispense Refill  . Vitamin D, Cholecalciferol, 50 MCG (2000 UT) CAPS Take 1 capsule by mouth daily.     No current facility-administered medications on file prior to visit.     PAST MEDICAL HISTORY: Past Medical History:  Diagnosis Date  . Anemia   . Back pain   . Depression    followed by [name]  . Gallbladder problem   . GERD (gastroesophageal reflux disease)   . History of kidney problems    1975, eval by 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 Kidney problem   . Migraines    [facility]  . Palpitations   . Vitamin D deficiency     PAST SURGICAL HISTORY: Past Surgical History:  Procedure Laterality Date  . CESAREAN SECTION  2007 & 2009  . CHOLECYSTECTOMY  2009  . CYSTOSCOPY  age 69  . SHOULDER ARTHROSCOPY WITH SUBACROMIAL DECOMPRESSION, ROTATOR CUFF REPAIR AND BICEP TENDON REPAIR Right 02/11/2016   Procedure: RIGHT SHOULDER ARTHROSCOPY WITH SUBACROMIAL DECOMPRESSION, OPEN DCR,  OPEN MINI ROTATOR CUFF REPAIR, LABRAL REPAIR;  Surgeon: Netta Cedars, MD;  Location: [place];  Service: Orthopedics;  Laterality: Right;  . TUBAL LIGATION    . uterine ablation      SOCIAL HISTORY: Social History   Tobacco Use  . Smoking status: Former Research scientist (life sciences)  . Smokeless tobacco: Never Used  Substance Use Topics  . Alcohol use: Yes    Comment: rare  . Drug use: No    FAMILY HISTORY: Family History  Problem Relation Age of Onset  . Hyperlipidemia Mother   . Depression Mother   . Cancer Mother   . Anxiety disorder Mother   . Obesity Mother   . Migraines Father   . Hypertension Father   .  Hyperlipidemia Father   . Cancer Father   . Bipolar disorder Sister   . Diabetes Paternal Grandfather   . Heart disease Paternal Grandfather     ROS: Review of Systems  Constitutional: Negative for weight loss.  Gastrointestinal: Negative for nausea and vomiting.  Musculoskeletal:       Negative for muscle weakness  Psychiatric/Behavioral: Positive for depression. Negative for suicidal ideas.    PHYSICAL EXAM: Blood pressure 106/65, pulse 68, temperature 98.1 F (36.7 C), temperature source Oral, height 5\' 4"  (1.626 m), weight 179 lb (81.2 kg), last menstrual period 11/03/2019, SpO2 97 %. Body mass index is 30.73 kg/m. Physical Exam Vitals signs reviewed.  Constitutional:      Appearance: Normal appearance. She is well-developed. She is obese.  Cardiovascular:     Rate and Rhythm: Normal rate.  Pulmonary:     Effort: Pulmonary effort is normal.  Musculoskeletal: Normal range of motion.  Skin:    General: Skin is warm  and dry.  Neurological:     Mental Status: She is alert and oriented to person, place, and time.  Psychiatric:        Mood and Affect: Mood normal.        Behavior: Behavior normal.        Thought Content: Thought content does not include homicidal or suicidal ideation.     RECENT LABS AND TESTS: BMET    Component Value Date/Time   NA 145 (H) 10/16/2018 1150   K 4.2 10/16/2018 1150   CL 102 10/16/2018 1150   CO2 24 10/16/2018 1150   GLUCOSE 78 10/16/2018 1150   GLUCOSE 98 02/01/2016 1129   BUN 12 10/16/2018 1150   CREATININE 0.80 10/16/2018 1150   CALCIUM 9.6 10/16/2018 1150   GFRNONAA 89 10/16/2018 1150   GFRAA 102 10/16/2018 1150   Lab Results  Component Value Date   HGBA1C 5.0 10/16/2018   Lab Results  Component Value Date   INSULIN 3.6 10/16/2018   CBC    Component Value Date/Time   WBC 5.8 10/16/2018 1150   WBC 9.0 02/01/2016 1129   RBC 4.16 10/16/2018 1150   RBC 4.41 02/01/2016 1129   HGB 13.1 10/16/2018 1150   HCT 38.3  10/16/2018 1150   PLT 200 02/01/2016 1129   MCV 92 10/16/2018 1150   MCH 31.5 10/16/2018 1150   MCH 31.1 02/01/2016 1129   MCHC 34.2 10/16/2018 1150   MCHC 33.9 02/01/2016 1129   RDW 11.8 (L) 10/16/2018 1150   LYMPHSABS 2.0 10/16/2018 1150   MONOABS 0.3 10/12/2008 0830   EOSABS 0.0 10/16/2018 1150   BASOSABS 0.0 10/16/2018 1150   Iron/TIBC/Ferritin/ %Sat No results found for: IRON, TIBC, FERRITIN, IRONPCTSAT Lipid Panel     Component Value Date/Time   CHOL 159 10/16/2018 1150   TRIG 47 10/16/2018 1150   HDL 55 10/16/2018 1150   LDLCALC 95 10/16/2018 1150   Hepatic Function Panel     Component Value Date/Time   PROT 7.3 10/16/2018 1150   ALBUMIN 4.5 10/16/2018 1150   AST 33 10/16/2018 1150   ALT 26 10/16/2018 1150   ALKPHOS 56 10/16/2018 1150   BILITOT 0.3 10/16/2018 1150   BILIDIR 0.1 05/05/2008 0114   IBILI 0.5 05/05/2008 0114      Component Value Date/Time   TSH 2.160 10/16/2018 1150     Ref. Range 05/05/2019 11:28  Vitamin D, 25-Hydroxy Latest Ref Range: 30.0 - 100.0 ng/mL 46.9     OBESITY BEHAVIORAL INTERVENTION VISIT  Today's visit was # 22  Starting weight: 191 lbs Starting date: 10/16/2018 Today's weight : 179 lbs Today's date: 11/03/2019 Total lbs lost to date: 12    11/03/2019  Height 5\' 4"  (1.626 m)  Weight 179 lb (81.2 kg)  BMI (Calculated) 30.71  BLOOD PRESSURE - SYSTOLIC [id number]  BLOOD PRESSURE - DIASTOLIC 65   Body Fat % [id number] %  Total Body Water (lbs) 76.6 lbs    ASK: We discussed the diagnosis of obesity with [name] today and [name] agreed to give [place] permission to discuss obesity behavioral modification therapy today.  ASSESS: [name] has the diagnosis of obesity and her BMI today is 30.71 [name] is in the action stage of change   ADVISE: [name] was educated on the multiple health risks of obesity as well as the benefit of weight loss to improve her health. She was advised of the need for long term treatment and the  importance of lifestyle modifications to improve her  current health and to decrease her risk of future health problems.  AGREE: Multiple dietary modification options and treatment options were discussed and  Makenzye agreed to follow the recommendations documented in the above note.  ARRANGE: [name] was educated on the importance of frequent visits to treat obesity as outlined per CMS and USPSTF guidelines and agreed to schedule her next follow up appointment today.  I, Doreene Nest, am acting as transcriptionist for [employer], FNP-C  I have reviewed the above documentation for accuracy and completeness, and I agree with the above.  - Mionna Advincula, FNP-C.

## 2019-11-05 ENCOUNTER — Encounter (INDEPENDENT_AMBULATORY_CARE_PROVIDER_SITE_OTHER): Payer: Self-pay | Admitting: Family Medicine

## 2019-11-05 ENCOUNTER — Ambulatory Visit (INDEPENDENT_AMBULATORY_CARE_PROVIDER_SITE_OTHER): Payer: 59 | Admitting: Professional

## 2019-11-05 DIAGNOSIS — F4323 Adjustment disorder with mixed anxiety and depressed mood: Secondary | ICD-10-CM | POA: Diagnosis not present

## 2019-11-08 ENCOUNTER — Telehealth: Payer: Self-pay | Admitting: Nurse Practitioner

## 2019-11-08 DIAGNOSIS — N3000 Acute cystitis without hematuria: Secondary | ICD-10-CM

## 2019-11-08 MED ORDER — CEPHALEXIN 500 MG PO CAPS: 500.0000 mg | ORAL_CAPSULE | Freq: Two times a day (BID) | ORAL | 0 refills | Status: DC

## 2019-11-08 NOTE — Progress Notes (Signed)

## 2019-11-10 ENCOUNTER — Other Ambulatory Visit: Payer: Self-pay

## 2019-11-10 ENCOUNTER — Ambulatory Visit (INDEPENDENT_AMBULATORY_CARE_PROVIDER_SITE_OTHER): Payer: 59 | Admitting: Psychology

## 2019-11-10 DIAGNOSIS — F3289 Other specified depressive episodes: Secondary | ICD-10-CM

## 2019-12-01 ENCOUNTER — Other Ambulatory Visit: Payer: Self-pay

## 2019-12-01 ENCOUNTER — Ambulatory Visit (INDEPENDENT_AMBULATORY_CARE_PROVIDER_SITE_OTHER): Payer: 59 | Admitting: Family Medicine

## 2019-12-01 ENCOUNTER — Encounter (INDEPENDENT_AMBULATORY_CARE_PROVIDER_SITE_OTHER): Payer: Self-pay | Admitting: Family Medicine

## 2019-12-01 VITALS — BP 115/66 | HR 60 | Temp 98.0°F | Ht 64.0 in | Wt 183.0 lb

## 2019-12-01 DIAGNOSIS — E669 Obesity, unspecified: Secondary | ICD-10-CM

## 2019-12-01 DIAGNOSIS — Z9189 Other specified personal risk factors, not elsewhere classified: Secondary | ICD-10-CM | POA: Diagnosis not present

## 2019-12-01 DIAGNOSIS — F3289 Other specified depressive episodes: Secondary | ICD-10-CM | POA: Diagnosis not present

## 2019-12-01 DIAGNOSIS — E559 Vitamin D deficiency, unspecified: Secondary | ICD-10-CM | POA: Diagnosis not present

## 2019-12-01 DIAGNOSIS — Z6831 Body mass index (BMI) 31.0-31.9, adult: Secondary | ICD-10-CM | POA: Diagnosis not present

## 2019-12-01 MED ORDER — BUPROPION HCL ER (SR) 150 MG PO TB12: 150.0000 mg | ORAL_TABLET | Freq: Every day | ORAL | 0 refills | Status: DC

## 2019-12-01 MED FILL — BUPROPION HCL SR 150 MG TAB: 150 | 30 days supply | Qty: 30 | Fill #0

## 2019-12-02 NOTE — Progress Notes (Addendum)
Office: [phone]  /  Fax: [phone]   HPI:  Chief Complaint: OBESITY [name] is here to discuss her progress with her obesity treatment plan. She is on the keeping a food journal of 1300 to 1500 calories and 90 grams of protein daily plan and states she is following her eating plan approximately 10 % of the time. She states she is doing strength training and cardio exercise (app) 30 to 40 minutes 6 times per week.  [name] has been off the plan completely over the last month. She did a lot of baking and she ate what she baked.   [name] has ordered groceries and has started back journaling today. She switched jobs and will be switching from nights to days which she believes will aid in weight loss.  Vitamin D deficiency [name] has a diagnosis of vitamin D deficiency and she is at goal. Vit D level is maintained on OTC vitamin D (2,000 IU).  At risk for osteoporosis [name] is at higher risk of osteoporosis due to vitamin D deficiency.  Other Depression with emotional eating [name]'s emotional eating has somewhat increased recently due to celebration over her new job.  Today's visit was # 23  Starting weight: 191 lbs Starting date: 10/16/2018 Today's weight : 183 lbs Today's date: 12/01/2019 Total lbs lost to date: 8 Total lbs lost since last in-office visit: 0  ASSESSMENT AND PLAN:  Vitamin D deficiency  Other depression - with emotional eating  - Plan: buPROPion (WELLBUTRIN SR) 150 MG 12 hr tablet  At risk for osteoporosis  Class 1 obesity with serious comorbidity and body mass index (BMI) of 31.0 to 31.9 in adult, unspecified obesity type  PLAN:  Vitamin D deficiency Low Vitamin D level contributes to fatigue and are associated with obesity, breast, and colon cancer. [name] will continue to take OTC vitamin D and she will follow-up for routine testing of vitamin D, at least 2-3 times per year to avoid over-replacement.  At risk for osteoporosis [name] was  given (~15 minutes) osteoporosis prevention counseling today. Bemnet is at risk for osteopenia and osteoporosis due to her Vitamin D deficiency. She was encouraged to take her Vitamin D and follow her higher calcium diet and increase strengthening exercise to help strengthen her bones and decrease her risk of osteopenia and osteoporosis.  Other Depression with emotional eating [name] agrees to continue Bupropion 150 mg qAM #30 with no refills and follow up as directed.  Obesity [name] is currently in the action stage of change. As such, her goal is to continue with weight loss efforts. She has agreed to keeping a food journal and adhering to recommended goals of 1300 to 1500 calories and 90 grams of protein daily. [name] will continue her current exercise regimen for weight loss and overall health benefits. We discussed the following Behavioral Modification Strategies today: increasing lean protein intake, decreasing simple carbohydrates, planning for success and keeping a strict food journal.  [name] has agreed to follow-up with our clinic in 2 to 3 weeks. She was informed of the importance of frequent follow-up visits to maximize her success with intensive lifestyle modifications for her multiple health conditions.  ALLERGIES: No Known Allergies  MEDICATIONS: No current outpatient medications on file prior to visit.   No current facility-administered medications on file prior to visit.    PAST MEDICAL HISTORY: Past Medical History:  Diagnosis Date  . Anemia   . Back pain   . Depression    followed by [name]  .  Gallbladder problem   . GERD (gastroesophageal reflux disease)   . History of kidney problems    1975, eval by 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 Kidney problem   . Migraines      [facility]  . Palpitations   . Vitamin D deficiency     PAST SURGICAL HISTORY: Past Surgical History:  Procedure Laterality Date  . CESAREAN SECTION  2007 & 2009  . CHOLECYSTECTOMY  2009  . CYSTOSCOPY  age 7  . SHOULDER ARTHROSCOPY WITH SUBACROMIAL DECOMPRESSION, ROTATOR CUFF REPAIR AND BICEP TENDON REPAIR Right 02/11/2016   Procedure: RIGHT SHOULDER ARTHROSCOPY WITH SUBACROMIAL DECOMPRESSION, OPEN DCR,  OPEN MINI ROTATOR CUFF REPAIR, LABRAL REPAIR;  Surgeon: Netta Cedars, MD;  Location: [place];  Service: Orthopedics;  Laterality: Right;  . TUBAL LIGATION    . uterine ablation      SOCIAL HISTORY: Social History   Tobacco Use  . Smoking status: Former Research scientist (life sciences)  . Smokeless tobacco: Never Used  Substance Use Topics  . Alcohol use: Yes    Comment: rare  . Drug use: No    FAMILY HISTORY: Family History  Problem Relation Age of Onset  . Hyperlipidemia Mother   . Depression Mother   . Cancer Mother   . Anxiety disorder Mother   . Obesity Mother   . Migraines Father   . Hypertension Father   . Hyperlipidemia Father   . Cancer Father   . Bipolar disorder Sister   . Diabetes Paternal Grandfather   . Heart disease Paternal Grandfather     ROS: Review of Systems  Constitutional: Negative for weight loss.  Psychiatric/Behavioral: Positive for depression.    PHYSICAL EXAM: Blood pressure 115/66, pulse 60, temperature 98 F (36.7 C), temperature source Oral, height 5\' 4"  (1.626 m), weight 183 lb (83 kg), last menstrual period 11/03/2019, SpO2 99 %. Body mass index is 31.41 kg/m. Physical Exam Vitals reviewed.  Constitutional:      General: She is not in acute distress.    Appearance: Normal appearance. She is well-developed.  Pulmonary:     Effort: Pulmonary effort is normal.  Neurological:     Mental Status: She is alert and oriented to person, place, and time.  Psychiatric:        Mood and Affect: Mood normal.        Behavior: Behavior normal.      RECENT LABS AND TESTS: BMET    Component Value Date/Time   NA 145 (H) 10/16/2018 1150   K 4.2 10/16/2018 1150   CL 102 10/16/2018 1150   CO2 24 10/16/2018 1150   GLUCOSE 78 10/16/2018 1150   GLUCOSE 98 02/01/2016 1129   BUN 12 10/16/2018 1150   CREATININE 0.80 10/16/2018 1150   CALCIUM 9.6 10/16/2018 1150   GFRNONAA 89 10/16/2018 1150   GFRAA 102 10/16/2018 1150   Lab Results  Component Value Date   HGBA1C 5.0 10/16/2018   Lab Results  Component Value Date   INSULIN 3.6 10/16/2018   CBC    Component Value Date/Time   WBC 5.8 10/16/2018 1150   WBC 9.0 02/01/2016 1129   RBC 4.16 10/16/2018 1150   RBC 4.41 02/01/2016 1129   HGB 13.1 10/16/2018 1150   HCT 38.3 10/16/2018 1150   PLT 200 02/01/2016 1129  MCV 92 10/16/2018 1150   MCH 31.5 10/16/2018 1150   MCH 31.1 02/01/2016 1129   MCHC 34.2 10/16/2018 1150   MCHC 33.9 02/01/2016 1129   RDW 11.8 (L) 10/16/2018 1150   LYMPHSABS 2.0 10/16/2018 1150   MONOABS 0.3 10/12/2008 0830   EOSABS 0.0 10/16/2018 1150   BASOSABS 0.0 10/16/2018 1150   Iron/TIBC/Ferritin/ %Sat No results found for: IRON, TIBC, FERRITIN, IRONPCTSAT Lipid Panel     Component Value Date/Time   CHOL 159 10/16/2018 1150   TRIG 47 10/16/2018 1150   HDL 55 10/16/2018 1150   LDLCALC 95 10/16/2018 1150   Hepatic Function Panel     Component Value Date/Time   PROT 7.3 10/16/2018 1150   ALBUMIN 4.5 10/16/2018 1150   AST 33 10/16/2018 1150   ALT 26 10/16/2018 1150   ALKPHOS 56 10/16/2018 1150   BILITOT 0.3 10/16/2018 1150   BILIDIR 0.1 05/05/2008 0114   IBILI 0.5 05/05/2008 0114      Component Value Date/Time   TSH 2.160 10/16/2018 1150    Ref. Range 05/05/2019 11:28  Vitamin D, 25-Hydroxy Latest Ref Range: 30.0 - 100.0 ng/mL 46.9    I, Doreene Nest, am acting as Location manager for [employer], FNP-C.  I have reviewed the above documentation for accuracy and completeness, and I agree with the above.  - Saamir Armstrong,  FNP-C.

## 2019-12-03 ENCOUNTER — Encounter (INDEPENDENT_AMBULATORY_CARE_PROVIDER_SITE_OTHER): Payer: Self-pay | Admitting: Family Medicine

## 2019-12-04 ENCOUNTER — Ambulatory Visit: Payer: 59 | Admitting: Professional

## 2019-12-10 ENCOUNTER — Ambulatory Visit: Payer: 59 | Admitting: Professional

## 2019-12-11 ENCOUNTER — Ambulatory Visit: Payer: 59 | Admitting: Professional

## 2019-12-12 ENCOUNTER — Ambulatory Visit (INDEPENDENT_AMBULATORY_CARE_PROVIDER_SITE_OTHER): Payer: 59 | Admitting: Professional

## 2019-12-12 DIAGNOSIS — F411 Generalized anxiety disorder: Secondary | ICD-10-CM | POA: Diagnosis not present

## 2019-12-17 ENCOUNTER — Ambulatory Visit: Payer: 59 | Admitting: Professional

## 2019-12-18 ENCOUNTER — Ambulatory Visit: Payer: 59 | Admitting: Professional

## 2019-12-19 ENCOUNTER — Ambulatory Visit (INDEPENDENT_AMBULATORY_CARE_PROVIDER_SITE_OTHER): Payer: 59 | Admitting: Professional

## 2019-12-19 DIAGNOSIS — F411 Generalized anxiety disorder: Secondary | ICD-10-CM | POA: Diagnosis not present

## 2019-12-22 ENCOUNTER — Other Ambulatory Visit: Payer: Self-pay

## 2019-12-22 ENCOUNTER — Encounter (INDEPENDENT_AMBULATORY_CARE_PROVIDER_SITE_OTHER): Payer: Self-pay | Admitting: Family Medicine

## 2019-12-22 ENCOUNTER — Ambulatory Visit (INDEPENDENT_AMBULATORY_CARE_PROVIDER_SITE_OTHER): Payer: 59 | Admitting: Family Medicine

## 2019-12-22 VITALS — BP 108/66 | HR 68 | Temp 97.9°F | Ht 64.0 in | Wt 178.0 lb

## 2019-12-22 DIAGNOSIS — E559 Vitamin D deficiency, unspecified: Secondary | ICD-10-CM | POA: Diagnosis not present

## 2019-12-22 DIAGNOSIS — F3289 Other specified depressive episodes: Secondary | ICD-10-CM | POA: Diagnosis not present

## 2019-12-22 DIAGNOSIS — Z6830 Body mass index (BMI) 30.0-30.9, adult: Secondary | ICD-10-CM

## 2019-12-22 DIAGNOSIS — R5383 Other fatigue: Secondary | ICD-10-CM | POA: Diagnosis not present

## 2019-12-22 DIAGNOSIS — E669 Obesity, unspecified: Secondary | ICD-10-CM | POA: Diagnosis not present

## 2019-12-22 MED ORDER — BUPROPION HCL ER (SR) 150 MG PO TB12: 150.0000 mg | ORAL_TABLET | Freq: Every day | ORAL | 0 refills | Status: DC

## 2019-12-22 NOTE — Progress Notes (Signed)
Chief Complaint:   OBESITY [name] is here to discuss her progress with her obesity treatment plan along with follow-up of her obesity related diagnoses. [name] is on keeping a food journal of 1500 calories and 90 to 100 grams of protein daily plan and states she is following her eating plan approximately 90% of the time. [name] states she is doing cardio exercise, yoga and weight training 30 to 40 minutes 5 to 6 times per week.  Today's visit was #: 24 Starting weight: 191 lbs Starting date: 10/16/2018 Today's weight: 178 lbs Today's date: 12/23/2019 Total lbs lost to date: 13 Total lbs lost since last in-office visit: 5  Interim History: Midna recently started a new job on day shift. She feels much better and her sleep is much improved. She reports greater consistency with sticking to the plan. [name] is journaling very consistently and she is hitting her protein goals most days.  Subjective:   Other fatigue - Plan: Comprehensive metabolic panel, CBC with Differential/Platelet, Hemoglobin A1c, Insulin, random, Lipid Panel With LDL/HDL Ratio, VITAMIN D 25 Hydroxy (Vit-D Deficiency, Fractures), T3, T4, free, TSH [name]'s fatigue has improved since starting day shift.  Vitamin D deficiency - Plan: VITAMIN D 25 Hydroxy (Vit-D Deficiency, Fractures) [name]'s last Vitamin D level was nearly at goal (46.9 on 05/05/19). [name] is on OTC vit D 2,000 IU daily, and she reports being inconsistent with taking it.   Other depression - with emotional eating  - Plan: buPROPion (WELLBUTRIN SR) 150 MG 12 hr tablet . Her emotional eating is well controlled. She has been working on behavior modification techniques to help reduce her emotional eating and has been somewhat successful. Her mood is stable. She shows no sign of suicidal or homicidal ideations.  Assessment/Plan:   Other fatigue - Plan: Comprehensive metabolic panel, CBC with Differential/Platelet, Hemoglobin A1c, Insulin,  random, Lipid Panel With LDL/HDL Ratio, VITAMIN D 25 Hydroxy (Vit-D Deficiency, Fractures), T3, T4, free, TSH We will check thyroid panel, fasting lipid panel, A1c, CMET, insulin level and CBC today.  Vitamin D deficiency - Plan: VITAMIN D 25 Hydroxy (Vit-D Deficiency, Fractures) Low Vitamin D level contributes to fatigue and are associated with obesity, breast, and colon cancer. [name] will continue taking OTC Vit D 2,000 IU daily and we will check vitamin D level today. She will follow-up for routine testing of Vitamin D, at least 2-3 times per year to avoid over-replacement.  Other depression - with emotional eating  - Plan: buPROPion (WELLBUTRIN SR) 150 MG 12 hr tablet  [name] Coleman to continue Bupropion 150 mg every morning #30 with no refills. Orders and follow up as documented in patient record.   Class 1 obesity with serious comorbidity and body mass index (BMI) of 30.0 to 30.9 in adult, unspecified obesity type [name] is currently in the action stage of change. As such, her goal is to continue with weight loss efforts. She has Coleman to keeping a food journal and adhering to recommended goals of 1500 calories and 90 to 100 grams of protein daily.   [name] will continue her current exercise regimen.  Behavioral modification strategies: decreasing simple carbohydrates, planning for success and keeping a strict food journal.  [name] has Coleman to follow-up with our clinic in 3 weeks. She was informed of the importance of frequent follow-up visits to maximize her success with intensive lifestyle modifications for her multiple health conditions.   [name] was informed we would discuss her lab results at her next  visit unless there is a critical issue that needs to be addressed sooner. [name] to keep her next visit at the Coleman upon time to discuss these results.  Objective:   Blood pressure 108/66, pulse 68, temperature 97.9 F (36.6 C), temperature source Oral, height 5\' 4"   (1.626 m), weight 178 lb (80.7 kg), last menstrual period 12/01/2019, SpO2 98 %. Body mass index is 30.55 kg/m.  General: Cooperative, alert, well developed, in no acute distress. HEENT: Conjunctivae and lids unremarkable. Cardiovascular: Regular rhythm.  Lungs: Normal work of breathing. Neurologic: No focal deficits.   Lab Results  Component Value Date   CREATININE 0.80 10/16/2018   BUN 12 10/16/2018   NA 145 (H) 10/16/2018   K 4.2 10/16/2018   CL 102 10/16/2018   CO2 24 10/16/2018   Lab Results  Component Value Date   ALT 26 10/16/2018   AST 33 10/16/2018   ALKPHOS 56 10/16/2018   BILITOT 0.3 10/16/2018   Lab Results  Component Value Date   HGBA1C 5.0 10/16/2018   Lab Results  Component Value Date   INSULIN 3.6 10/16/2018   Lab Results  Component Value Date   TSH 2.160 10/16/2018   Lab Results  Component Value Date   CHOL 159 10/16/2018   HDL 55 10/16/2018   LDLCALC 95 10/16/2018   TRIG 47 10/16/2018   Lab Results  Component Value Date   WBC 5.8 10/16/2018   HGB 13.1 10/16/2018   HCT 38.3 10/16/2018   MCV 92 10/16/2018   PLT 200 02/01/2016   No results found for: IRON, TIBC, FERRITIN   Ref. Range 05/05/2019 11:28  Vitamin D, 25-Hydroxy Latest Ref Range: 30.0 - 100.0 ng/mL 46.9    Attestation Statements:   Reviewed by clinician on day of visit: allergies, medications, problem list, medical history, surgical history, family history, social history, and previous encounter notes.  Corey Skains, am acting as Location manager for [employer], FNP-C.  I have reviewed the above documentation for accuracy and completeness, and I agree with the above. -  Alvia Tory [employer], FNP-C

## 2019-12-23 ENCOUNTER — Encounter (INDEPENDENT_AMBULATORY_CARE_PROVIDER_SITE_OTHER): Payer: Self-pay | Admitting: Family Medicine

## 2019-12-23 LAB — CBC WITH DIFFERENTIAL/PLATELET
Basophils Absolute: 0 10*3/uL (ref 0.0–0.2)
Basos: 1 %
EOS (ABSOLUTE): 0.1 10*3/uL (ref 0.0–0.4)
Eos: 1 %
Hematocrit: 40.2 % (ref 34.0–46.6)
Hemoglobin: 13.6 g/dL (ref 11.1–15.9)
Immature Grans (Abs): 0 10*3/uL (ref 0.0–0.1)
Immature Granulocytes: 0 %
Lymphocytes Absolute: 2.1 10*3/uL (ref 0.7–3.1)
Lymphs: 33 %
MCH: 31.8 pg (ref 26.6–33.0)
MCHC: 33.8 g/dL (ref 31.5–35.7)
MCV: 94 fL (ref 79–97)
Monocytes Absolute: 0.3 10*3/uL (ref 0.1–0.9)
Monocytes: 5 %
Neutrophils Absolute: 3.9 10*3/uL (ref 1.4–7.0)
Neutrophils: 60 %
Platelets: 242 10*3/uL (ref 150–450)
RBC: 4.28 x10E6/uL (ref 3.77–5.28)
RDW: 12 % (ref 11.7–15.4)
WBC: 6.4 10*3/uL (ref 3.4–10.8)

## 2019-12-23 LAB — COMPREHENSIVE METABOLIC PANEL
ALT: 11 IU/L (ref 0–32)
AST: 13 IU/L (ref 0–40)
Albumin/Globulin Ratio: 1.7 (ref 1.2–2.2)
Albumin: 4.2 g/dL (ref 3.8–4.8)
Alkaline Phosphatase: 54 IU/L (ref 39–117)
BUN/Creatinine Ratio: 19 (ref 9–23)
BUN: 14 mg/dL (ref 6–24)
Bilirubin Total: 0.3 mg/dL (ref 0.0–1.2)
CO2: 23 mmol/L (ref 20–29)
Calcium: 9.3 mg/dL (ref 8.7–10.2)
Chloride: 106 mmol/L (ref 96–106)
Creatinine, Ser: 0.73 mg/dL (ref 0.57–1.00)
GFR calc Af Amer: 113 mL/min/{1.73_m2} (ref >59)
GFR calc non Af Amer: 98 mL/min/{1.73_m2} (ref >59)
Globulin, Total: 2.5 g/dL (ref 1.5–4.5)
Glucose: 87 mg/dL (ref 65–99)
Potassium: 4.9 mmol/L (ref 3.5–5.2)
Sodium: 143 mmol/L (ref 134–144)
Total Protein: 6.7 g/dL (ref 6.0–8.5)

## 2019-12-23 LAB — TSH: TSH: 3.89 u[IU]/mL (ref 0.450–4.500)

## 2019-12-23 LAB — LIPID PANEL WITH LDL/HDL RATIO
Cholesterol, Total: 158 mg/dL (ref 100–199)
HDL: 51 mg/dL (ref >39)
LDL Chol Calc (NIH): 96 mg/dL (ref 0–99)
LDL/HDL Ratio: 1.9 ratio (ref 0.0–3.2)
Triglycerides: 54 mg/dL (ref 0–149)
VLDL Cholesterol Cal: 11 mg/dL (ref 5–40)

## 2019-12-23 LAB — T3: T3, Total: 96 ng/dL (ref 71–180)

## 2019-12-23 LAB — HEMOGLOBIN A1C
Est. average glucose Bld gHb Est-mCnc: 88 mg/dL
Hgb A1c MFr Bld: 4.7 % — ABNORMAL LOW (ref 4.8–5.6)

## 2019-12-23 LAB — VITAMIN D 25 HYDROXY (VIT D DEFICIENCY, FRACTURES): Vit D, 25-Hydroxy: 32 ng/mL (ref 30.0–100.0)

## 2019-12-23 LAB — INSULIN, RANDOM: INSULIN: 6.5 u[IU]/mL (ref 2.6–24.9)

## 2019-12-23 LAB — T4, FREE: Free T4: 1.21 ng/dL (ref 0.82–1.77)

## 2019-12-24 ENCOUNTER — Ambulatory Visit: Payer: 59 | Admitting: Professional

## 2019-12-25 ENCOUNTER — Ambulatory Visit: Payer: 59 | Admitting: Professional

## 2020-01-01 ENCOUNTER — Ambulatory Visit: Payer: 59 | Admitting: Professional

## 2020-01-02 ENCOUNTER — Ambulatory Visit (INDEPENDENT_AMBULATORY_CARE_PROVIDER_SITE_OTHER): Payer: 59 | Admitting: Professional

## 2020-01-02 DIAGNOSIS — F411 Generalized anxiety disorder: Secondary | ICD-10-CM | POA: Diagnosis not present

## 2020-01-05 MED FILL — BUPROPION HCL SR 150 MG TAB: 150 | 30 days supply | Qty: 30 | Fill #0

## 2020-01-08 ENCOUNTER — Ambulatory Visit: Payer: 59 | Admitting: Professional

## 2020-01-13 ENCOUNTER — Ambulatory Visit (INDEPENDENT_AMBULATORY_CARE_PROVIDER_SITE_OTHER): Payer: 59 | Admitting: Family Medicine

## 2020-01-13 ENCOUNTER — Ambulatory Visit (INDEPENDENT_AMBULATORY_CARE_PROVIDER_SITE_OTHER): Payer: 59 | Admitting: Professional

## 2020-01-13 ENCOUNTER — Encounter (INDEPENDENT_AMBULATORY_CARE_PROVIDER_SITE_OTHER): Payer: Self-pay | Admitting: Family Medicine

## 2020-01-13 ENCOUNTER — Other Ambulatory Visit: Payer: Self-pay

## 2020-01-13 VITALS — BP 114/67 | HR 65 | Temp 98.0°F | Ht 64.0 in | Wt 182.0 lb

## 2020-01-13 DIAGNOSIS — M25511 Pain in right shoulder: Secondary | ICD-10-CM | POA: Diagnosis not present

## 2020-01-13 DIAGNOSIS — F3289 Other specified depressive episodes: Secondary | ICD-10-CM

## 2020-01-13 DIAGNOSIS — E669 Obesity, unspecified: Secondary | ICD-10-CM

## 2020-01-13 DIAGNOSIS — Z6831 Body mass index (BMI) 31.0-31.9, adult: Secondary | ICD-10-CM

## 2020-01-13 DIAGNOSIS — E559 Vitamin D deficiency, unspecified: Secondary | ICD-10-CM | POA: Diagnosis not present

## 2020-01-13 DIAGNOSIS — F411 Generalized anxiety disorder: Secondary | ICD-10-CM | POA: Diagnosis not present

## 2020-01-13 MED FILL — METHOCARBAMOL 500 MG TABS: 500 | 15 days supply | Qty: 60 | Fill #0

## 2020-01-13 NOTE — Progress Notes (Signed)
Chief Complaint:   OBESITY [name] is here to discuss her progress with her obesity treatment plan along with follow-up of her obesity related diagnoses. Annu is keeping a food journal of 1500 calories and 90 to 100 grams of protein daily and states she is following her eating plan approximately 70% of the time. Ashleah states she is doing cardio exercise and strength training 30 to 40 minutes 5 to 6 times per week.  Today's visit was #: 25 Starting weight: 191 lbs Starting date: 10/16/2018 Today's weight: 182 lbs Today's date: 01/13/2020 Total lbs lost to date: 9 Total lbs lost since last in-office visit: 0  Interim History: Makennah reports that she has been off the plan the last four days. She started back journaling yesterday. She has journaled all but six days for the last three weeks. She is meeting her protein goals daily. Paiyton goes over her goal on calories at times.  Subjective:   Vitamin D deficiency Jalaysia has worsening vitamin D deficiency. She is on OTC vit D 4,000 IU daily. Her vitamin D level has decreased to 32.0 (12/22/19), from 46.9. She has not been compliant with vitamin D daily. Labs were discussed with patient today.  Other depression, with emotional eating Aalayna is on Bupropion. She meets with a counselor and she has been discussing reasons for emotional eating with the counselor. She has been working on behavior modification techniques to help reduce her emotional eating and has been somewhat successful. She shows no sign of suicidal or homicidal ideations.  Assessment/Plan:   Vitamin D deficiency Low Vitamin D level contributes to fatigue and are associated with obesity, breast, and colon cancer. Charysse agrees to increase the dose of OTC Vitamin D3 to 5,000 IU daily. She will follow-up for routine testing of Vitamin D, at least 2-3 times per year to avoid over-replacement.  Other depression, with emotional eating Randy will continue  Bupropion and meeting with the counselor every other week. Orders and follow up as documented in patient record.   Class 1 obesity with serious comorbidity and body mass index (BMI) of 31.0 to 31.9 in adult, unspecified obesity type Kalima is currently in the action stage of change. As such, her goal is to continue with weight loss efforts. She has agreed to start the low carb plan.  Exercise goals: Gustava will continue doing cardio exercise and strength training 30 to 40 minutes, 5 to 6 times per week.  Behavioral modification strategies: decreasing simple carbohydrates and keeping a strict food journal. Stephine may have a low carb protein shake at breakfast on the low carb plan.  Jequetta has agreed to follow-up with our clinic in 3 weeks. She was informed of the importance of frequent follow-up visits to maximize her success with intensive lifestyle modifications for her multiple health conditions.   Objective:   Blood pressure 114/67, pulse 65, temperature 98 F (36.7 C), temperature source Oral, height 5\' 4"  (1.626 m), weight 182 lb (82.6 kg), last menstrual period 12/23/2019, SpO2 98 %. Body mass index is 31.24 kg/m.  General: Cooperative, alert, well developed, in no acute distress. HEENT: Conjunctivae and lids unremarkable. Cardiovascular: Regular rhythm.  Lungs: Normal work of breathing. Neurologic: No focal deficits.   Lab Results  Component Value Date   CREATININE 0.73 12/22/2019   BUN 14 12/22/2019   NA 143 12/22/2019   K 4.9 12/22/2019   CL 106 12/22/2019   CO2 23 12/22/2019   Lab Results  Component Value Date  ALT 11 12/22/2019   AST 13 12/22/2019   ALKPHOS 54 12/22/2019   BILITOT 0.3 12/22/2019   Lab Results  Component Value Date   HGBA1C 4.7 (L) 12/22/2019   HGBA1C 5.0 10/16/2018   Lab Results  Component Value Date   INSULIN 6.5 12/22/2019   INSULIN 3.6 10/16/2018   Lab Results  Component Value Date   TSH 3.890 12/22/2019   Lab Results    Component Value Date   CHOL 158 12/22/2019   HDL 51 12/22/2019   LDLCALC 96 12/22/2019   TRIG 54 12/22/2019   Lab Results  Component Value Date   WBC 6.4 12/22/2019   HGB 13.6 12/22/2019   HCT 40.2 12/22/2019   MCV 94 12/22/2019   PLT 242 12/22/2019   No results found for: IRON, TIBC, FERRITIN   Ref. Range 12/22/2019 08:34  Vitamin D, 25-Hydroxy Latest Ref Range: 30.0 - 100.0 ng/mL 32.0    Attestation Statements:   Reviewed by clinician on day of visit: allergies, medications, problem list, medical history, surgical history, family history, social history, and previous encounter notes.  Corey Skains, am acting as Location manager for [employer], FNP-C.  I have reviewed the above documentation for accuracy and completeness, and I agree with the above. -  Yunique Dearcos [employer], FNP-C

## 2020-01-14 ENCOUNTER — Encounter (INDEPENDENT_AMBULATORY_CARE_PROVIDER_SITE_OTHER): Payer: Self-pay | Admitting: Family Medicine

## 2020-01-21 ENCOUNTER — Ambulatory Visit: Payer: Self-pay | Admitting: Physical Therapy

## 2020-01-26 ENCOUNTER — Ambulatory Visit (INDEPENDENT_AMBULATORY_CARE_PROVIDER_SITE_OTHER): Payer: 59 | Admitting: Professional

## 2020-01-26 DIAGNOSIS — F411 Generalized anxiety disorder: Secondary | ICD-10-CM

## 2020-01-30 ENCOUNTER — Encounter: Payer: Self-pay | Admitting: Physical Therapy

## 2020-01-30 ENCOUNTER — Other Ambulatory Visit: Payer: Self-pay

## 2020-01-30 ENCOUNTER — Ambulatory Visit: Payer: 59 | Attending: Physician Assistant | Admitting: Physical Therapy

## 2020-01-30 DIAGNOSIS — M6281 Muscle weakness (generalized): Secondary | ICD-10-CM | POA: Diagnosis present

## 2020-01-30 DIAGNOSIS — G8929 Other chronic pain: Secondary | ICD-10-CM | POA: Insufficient documentation

## 2020-01-30 DIAGNOSIS — R252 Cramp and spasm: Secondary | ICD-10-CM | POA: Diagnosis present

## 2020-01-30 DIAGNOSIS — M25511 Pain in right shoulder: Secondary | ICD-10-CM | POA: Insufficient documentation

## 2020-01-30 NOTE — Therapy (Signed)
[facility] [address] [place], [place], [address] Phone: [phone]   Fax:  [phone]  Physical Therapy Evaluation  Patient Details  Name: [name] MRN: [id number] Date of Birth: [birth year] Referring Provider (PT): Shelle Iron PA   Encounter Date: 01/30/2020  PT End of Session - 01/30/20 1207    Visit Number  1    Date for PT Re-Evaluation  03/26/20    Authorization Type  [facility]    PT Start Time  0801    PT Stop Time  0850    PT Time Calculation (min)  49 min    Activity Tolerance  Patient tolerated treatment well       Past Medical History:  Diagnosis Date  . Anemia   . Back pain   . Depression    followed by [name]  . Gallbladder problem   . GERD (gastroesophageal reflux disease)   . History of kidney problems    1975, eval by 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 Kidney problem   . Migraines    [facility]  . Palpitations   . Vitamin D deficiency     Past Surgical History:  Procedure Laterality Date  . CESAREAN SECTION  2007 & 2009  . CHOLECYSTECTOMY  2009  . CYSTOSCOPY  age 65  . SHOULDER ARTHROSCOPY WITH SUBACROMIAL DECOMPRESSION, ROTATOR CUFF REPAIR AND BICEP TENDON REPAIR Right 02/11/2016   Procedure: RIGHT SHOULDER ARTHROSCOPY WITH SUBACROMIAL DECOMPRESSION, OPEN DCR,  OPEN MINI ROTATOR CUFF REPAIR, LABRAL REPAIR;  Surgeon: Netta Cedars, MD;  Location: [facility];  Service: Orthopedics;  Laterality: Right;  . TUBAL LIGATION    . uterine ablation      There were no vitals filed for this visit.   Subjective Assessment - 01/30/20 0808    Subjective  Off/on right shoulder pain 1-2 years, worse last 2 months.  Exercising at home, doing a lot of UE weight bearing.  Right lateral  deltoid tightness, upper trap, rhomboid, pectoral tightness/pain.   Left clavicle. Massage didn't really help.  Avoiding weight bearing on arms when exercising.    Diagnostic tests  [name] felt rotator cuff repair still good but "some arthritis" in there    Patient Stated Goals  get back to working out;  pain relief    Currently in Pain?  Yes    Pain Score  1     Pain Location  Shoulder    Pain Orientation  Right    Pain Type  Chronic pain    Pain Onset  More than a month ago    Pain Frequency  Intermittent    Aggravating Factors   weight bearing         [facility] PT Assessment - 01/30/20 0001      Assessment   Medical Diagnosis  right shoulder pain    Referring Provider (PT)  Shelle Iron PA    Onset Date/Surgical Date  --   2 months    Hand Dominance  Right    Prior Therapy  for cuff repair 2017      Precautions   Precautions  None      Restrictions   Weight Bearing Restrictions  No      Balance Screen   Has the patient  fallen in the past 6 months  No    Has the patient had a decrease in activity level because of a fear of falling?   No    Is the patient reluctant to leave their home because of a fear of falling?   No      Home Film/video editor residence    Living Arrangements  Spouse/significant other;Children      Prior Function   Level of Independence  Independent    Vocation  Part time employment    Vocation Requirements  nurse outpatient surgery     Leisure  work out; activities with kids       Observation/Other Assessments   Focus on Therapeutic Outcomes (FOTO)   21% limitation       AROM   Overall AROM Comments  decreased scapular dissociation     Right Shoulder Flexion  168 Degrees    Right Shoulder ABduction  168 Degrees    Right Shoulder Internal Rotation  --   T 10   Right Shoulder External Rotation  --   C7   Left Shoulder Flexion  163 Degrees    Left Shoulder ABduction  168 Degrees    Left Shoulder Internal Rotation   --   T6   Left Shoulder External Rotation  --   T4     Strength   Right Shoulder Flexion  5/5    Right Shoulder Extension  5/5    Right Shoulder ABduction  5/5    Right Shoulder Internal Rotation  5/5    Right Shoulder External Rotation  5/5    Right Shoulder Horizontal ABduction  4+/5    Right Shoulder Horizontal ADduction  4+/5      Palpation   Palpation comment  tender points in lateral deltoid, upper traps, levator scap, rhomboids       Hawkins-Kennedy test   Findings  Negative      Empty Can test   Findings  Negative      Lag time at 0 degrees   Findings  Negative      Drop Arm test   Findings  Negative      Painful Arc of Motion   Findings  Negative                Objective measurements completed on examination: See above findings.      [facility] Adult PT Treatment/Exercise - 01/30/20 0001      Moist Heat Therapy   Number Minutes Moist Heat  5 Minutes    Moist Heat Location  Shoulder      Manual Therapy   Soft tissue mobilization  right upper traps, rhomboids, levator scap, subscapularis, deltoids        Trigger Point Dry Needling - 01/30/20 0001    Consent Given?  Yes    Education Handout Provided  Previously provided    Muscles Treated Head and Neck  Upper trapezius;Levator scapulae    Muscles Treated Upper Quadrant  Rhomboids;Supraspinatus;Infraspinatus;Subscapularis;Deltoid    Upper Trapezius Response  Twitch reponse elicited;Palpable increased muscle length    Levator Scapulae Response  Palpable increased muscle length    Rhomboids Response  Palpable increased muscle length    Supraspinatus Response  Palpable increased muscle length    Infraspinatus Response  Palpable increased muscle length    Subscapularis Response  Palpable increased muscle length    Deltoid Response  Palpable increased muscle length  PT Short Term Goals - 01/30/20 1218      PT SHORT TERM GOAL #1   Title  The patient will report understanding of  basic self care and HEP    Time  4    Period  Weeks    Status  New    Target Date  02/27/20      PT SHORT TERM GOAL #2   Title  The patient will report a 30% improvement in shoulder discomfort    Time  4    Period  Weeks    Status  New      PT SHORT TERM GOAL #3   Title  ...      PT SHORT TERM GOAL #4   Title  ...        PT Long Term Goals - 01/30/20 1219      PT LONG TERM GOAL #1   Title  The patient will be independent in safe, self progression of HEP for further strengthening of right shoulder    Time  8    Period  Weeks    Status  New    Target Date  03/26/20      PT LONG TERM GOAL #2   Title  The patient will have 5/5 scapular strength needed for home and work tasks including activities that require weight bearing through UEs    Time  8    Period  Weeks    Status  New      PT LONG TERM GOAL #3   Title  The patient will report a 60% improvement in right shoulder discomfort with lifting and other home/work ADLS    Time  8    Period  Weeks    Status  New      PT LONG TERM GOAL #4   Title  FOTO functional outcome score improved to 18% limitation    Period  Weeks    Status  New      PT LONG TERM GOAL #5   Title  .Marland KitchenMarland Kitchen             Plan - 01/30/20 1208    Clinical Impression Statement  The patient reports a 1-2 year history of right shoulder/scapular tightness which as worsened over the last 2 months.  She states discomfort may be aggravated by UE weight bearing like push ups and planks.  Past medical history includes right rotator cuff repair in 2017.  Her shoulder ROM is full and painless.  Her glenohumeral strength is grossly 5/5; mild scapular weakness 4+/5.  Numerous myofascial tender points in upper traps, levator scap, rhomboids and deltoids.  Decreased soft tissue mobility in subcapularis contributes to decreased scapular dissociation.  She has benefited from dry needling in the past and is receptive to this for treating myofascial tender points.   Negative rotator cuff special tests.    Examination-Activity Limitations  Other;Lift;Carry    Examination-Participation Restrictions  Other    Stability/Clinical Decision Making  Stable/Uncomplicated    Clinical Decision Making  Low    Rehab Potential  Good    PT Frequency  2x / week    PT Duration  8 weeks    PT Treatment/Interventions  ADLs/Self Care Home Management;Electrical Stimulation;Cryotherapy;Ultrasound;Moist Heat;Iontophoresis 4mg /ml Dexamethasone;Therapeutic activities;Therapeutic exercise;Neuromuscular re-education;Manual techniques;Patient/family education;Dry needling;Taping    PT Next Visit Plan  assess response to DN;  taping; scapular strengthening; initiate HEP    Consulted and Agree with Plan of Care  Patient  Patient will benefit from skilled therapeutic intervention in order to improve the following deficits and impairments:  Increased fascial restricitons, Pain, Impaired UE functional use, Increased muscle spasms, Decreased strength  Visit Diagnosis: Chronic right shoulder pain - Plan: PT plan of care cert/re-cert  Cramp and spasm - Plan: PT plan of care cert/re-cert  Muscle weakness (generalized) - Plan: PT plan of care cert/re-cert     Problem List Patient Active Problem List   Diagnosis Date Noted  . Depression 05/22/2019  . Vitamin D deficiency 12/03/2018  . Class 1 obesity with serious comorbidity and body mass index (BMI) of 31.0 to 31.9 in adult 12/03/2018   Ruben Im, PT 01/30/20 12:24 PM Phone: [phone] Fax: [phone] [name] 01/30/2020, 12:24 PM  [facility] [address], [place], [place], [address] Phone: [phone]   Fax:  [phone]  Name: [name] MRN: [id number] Date of Birth: [birth year]

## 2020-02-04 ENCOUNTER — Other Ambulatory Visit: Payer: Self-pay

## 2020-02-04 ENCOUNTER — Encounter (INDEPENDENT_AMBULATORY_CARE_PROVIDER_SITE_OTHER): Payer: Self-pay | Admitting: Family Medicine

## 2020-02-04 ENCOUNTER — Ambulatory Visit (INDEPENDENT_AMBULATORY_CARE_PROVIDER_SITE_OTHER): Payer: 59 | Admitting: Family Medicine

## 2020-02-04 VITALS — BP 101/52 | HR 68 | Temp 97.6°F | Ht 64.0 in | Wt 181.0 lb

## 2020-02-04 DIAGNOSIS — Z6831 Body mass index (BMI) 31.0-31.9, adult: Secondary | ICD-10-CM

## 2020-02-04 DIAGNOSIS — E669 Obesity, unspecified: Secondary | ICD-10-CM

## 2020-02-04 DIAGNOSIS — E559 Vitamin D deficiency, unspecified: Secondary | ICD-10-CM | POA: Diagnosis not present

## 2020-02-04 DIAGNOSIS — Z9189 Other specified personal risk factors, not elsewhere classified: Secondary | ICD-10-CM | POA: Diagnosis not present

## 2020-02-04 DIAGNOSIS — F3289 Other specified depressive episodes: Secondary | ICD-10-CM | POA: Diagnosis not present

## 2020-02-04 MED ORDER — BUPROPION HCL ER (SR) 150 MG PO TB12: 150.0000 mg | ORAL_TABLET | Freq: Every day | ORAL | 0 refills | Status: DC

## 2020-02-04 MED FILL — BUPROPION HCL SR 150 MG TAB: 150 | 30 days supply | Qty: 30 | Fill #0

## 2020-02-04 NOTE — Progress Notes (Signed)
Chief Complaint:   OBESITY [name] is here to discuss her progress with her obesity treatment plan along with follow-up of her obesity related diagnoses. [name] is on following a lower carbohydrate, vegetable and lean protein rich diet plan and states she is following her eating plan approximately 80% of the time. [name] states she is strength training and cardio for 30-40 minutes 6 times per week.  Today's visit was #: 26 Starting weight: 191 lbs Starting date: 10/16/2018 Today's weight: 181 lbs Today's date: 02/04/2020 Total lbs lost to date: 10 Total lbs lost since last in-office visit: 1  Interim History: [name] did start the low carbohydrate plan the week after our last appointment. She has added a cup of milk to it for her morning shake.  She also reports being off the plan over the weekend. She notices a decrease in appetite. Her energy is also down, and she feels she may not be eating enough protein.  Subjective:   1. Vitamin D deficiency [name]'s last Vit D was low at 32 on 12/22/2019. She increased OTC Vit D to 5,000 IU daily recently.  2. Other depression, with emotional eating [name]'s emotional eating is well controlled.  3. At risk for electrolyte imbalance [name] is at risk of electrolyte imbalance due to being on the low carbohydrate plan. She feels fatigued since starting the low carbohydrate plan. She does admit to not eating enough possibly due to lack of appetite. She is drinking plenty of water.   Assessment/Plan:   1. Vitamin D deficiency Low Vitamin D level contributes to fatigue and are associated with obesity, breast, and colon cancer. [name] agreed to continue taking OTC Vit D 5,000 IU daily and will follow-up for routine testing of Vitamin D, at least 2-3 times per year to avoid over-replacement.  2. Other depression, with emotional eating Behavior modification techniques were discussed today to help [name] deal with her emotional/non-hunger  eating behaviors. we will refill bupropion for 1 month. Orders and follow up as documented in patient record.   - buPROPion (WELLBUTRIN SR) 150 MG 12 hr tablet; Take 1 tablet (150 mg total) by mouth daily.  Dispense: 30 tablet; Refill: 0  3. At risk for electrolyte imbalance [name] was given approximately 15 minutes dehydration prevention counseling today. [name] is at risk for electrolyte imbalance due to being on the low carbohydrate plan. She was encouraged to hydrate and monitor fluid status to avoid dehydration as well as weight loss plateaus.  She was advised to increas salt intake with broth or Gatorade Zero.   4. Class 1 obesity with serious comorbidity and body mass index (BMI) of 31.0 to 31.9 in adult, unspecified obesity type [name] is currently in the action stage of change. As such, her goal is to continue with weight loss efforts. She has agreed to keeping a food journal and adhering to recommended goals of 1400-1500 calories and 90-10 grams of protein daily or following a lower carbohydrate, vegetable and lean protein rich diet plan.   [name] will try the Low carbohydrate plan for another week, and then she may switch to journaling. Advised her it is ok to eat the beans allowed on the Low carbohydrate plan.  Exercise goals: As is.  Behavioral modification strategies: increasing lean protein intake, increasing sodium intake, no skipping meals and planning for success.  [name] has agreed to follow-up with our clinic in 3 weeks. She was informed of the importance of frequent follow-up visits to maximize her success with intensive  lifestyle modifications for her multiple health conditions.   Objective:   Blood pressure (!) 101/52, pulse 68, temperature 97.6 F (36.4 C), temperature source Oral, height 5\' 4"  (1.626 m), weight 181 lb (82.1 kg), last menstrual period 12/31/2019, SpO2 99 %. Body mass index is 31.07 kg/m.  General: Cooperative, alert, well developed, in no acute  distress. HEENT: Conjunctivae and lids unremarkable. Cardiovascular: Regular rhythm.  Lungs: Normal work of breathing. Neurologic: No focal deficits.   Lab Results  Component Value Date   CREATININE 0.73 12/22/2019   BUN 14 12/22/2019   NA 143 12/22/2019   K 4.9 12/22/2019   CL 106 12/22/2019   CO2 23 12/22/2019   Lab Results  Component Value Date   ALT 11 12/22/2019   AST 13 12/22/2019   ALKPHOS 54 12/22/2019   BILITOT 0.3 12/22/2019   Lab Results  Component Value Date   HGBA1C 4.7 (L) 12/22/2019   HGBA1C 5.0 10/16/2018   Lab Results  Component Value Date   INSULIN 6.5 12/22/2019   INSULIN 3.6 10/16/2018   Lab Results  Component Value Date   TSH 3.890 12/22/2019   Lab Results  Component Value Date   CHOL 158 12/22/2019   HDL 51 12/22/2019   LDLCALC 96 12/22/2019   TRIG 54 12/22/2019   Lab Results  Component Value Date   WBC 6.4 12/22/2019   HGB 13.6 12/22/2019   HCT 40.2 12/22/2019   MCV 94 12/22/2019   PLT 242 12/22/2019   No results found for: IRON, TIBC, FERRITIN  Attestation Statements:   Reviewed by clinician on day of visit: allergies, medications, problem list, medical history, surgical history, family history, social history, and previous encounter notes.   Wilhemena Durie, am acting as Location manager for [employer], FNP-C.  I have reviewed the above documentation for accuracy and completeness, and I agree with the above. -  Georgianne Fick, FNP

## 2020-02-05 ENCOUNTER — Ambulatory Visit: Payer: 59 | Admitting: Physical Therapy

## 2020-02-05 ENCOUNTER — Encounter (INDEPENDENT_AMBULATORY_CARE_PROVIDER_SITE_OTHER): Payer: Self-pay | Admitting: Family Medicine

## 2020-02-05 ENCOUNTER — Encounter: Payer: Self-pay | Admitting: Physical Therapy

## 2020-02-05 DIAGNOSIS — M6281 Muscle weakness (generalized): Secondary | ICD-10-CM

## 2020-02-05 DIAGNOSIS — G8929 Other chronic pain: Secondary | ICD-10-CM

## 2020-02-05 DIAGNOSIS — M25511 Pain in right shoulder: Secondary | ICD-10-CM | POA: Diagnosis not present

## 2020-02-05 DIAGNOSIS — R252 Cramp and spasm: Secondary | ICD-10-CM

## 2020-02-05 NOTE — Therapy (Signed)
[facility] [address], [place], [place], [address] Phone: [phone]   Fax:  [phone]  Physical Therapy Treatment  Patient Details  Name: [name] MRN: [id number] Date of Birth: 1971/07/08 Referring Provider (PT): Shelle Iron PA   Encounter Date: 02/05/2020  PT End of Session - 02/05/20 1522    Visit Number  2    Date for PT Re-Evaluation  03/26/20    Authorization Type  [employer] Choice    PT Start Time  1445    PT Stop Time  1528    PT Time Calculation (min)  43 min    Activity Tolerance  Patient tolerated treatment well       Past Medical History:  Diagnosis Date  . Anemia   . Back pain   . Depression    followed by [name]  . Gallbladder problem   . GERD (gastroesophageal reflux disease)   . History of kidney problems    1975, eval by 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 Kidney problem   . Migraines    [facility]  . Palpitations   . Vitamin D deficiency     Past Surgical History:  Procedure Laterality Date  . CESAREAN SECTION  2007 & 2009  . CHOLECYSTECTOMY  2009  . CYSTOSCOPY  age 82  . SHOULDER ARTHROSCOPY WITH SUBACROMIAL DECOMPRESSION, ROTATOR CUFF REPAIR AND BICEP TENDON REPAIR Right 02/11/2016   Procedure: RIGHT SHOULDER ARTHROSCOPY WITH SUBACROMIAL DECOMPRESSION, OPEN DCR,  OPEN MINI ROTATOR CUFF REPAIR, LABRAL REPAIR;  Surgeon: Netta Cedars, MD;  Location: [place];  Service: Orthopedics;  Laterality: Right;  . TUBAL LIGATION    . uterine ablation      There were no vitals filed for this visit.  Subjective Assessment - 02/05/20 1444    Subjective  Minor soreness following dry needling.  I think it helped.    Diagnostic tests  [name] felt rotator cuff repair still good but  "some arthritis" in there    Patient Stated Goals  get back to working out;  pain relief    Currently in Pain?  Yes    Pain Score  1     Pain Location  Shoulder    Pain Orientation  Right                       [facility] Adult PT Treatment/Exercise - 02/05/20 0001      Shoulder Exercises: Supine   Other Supine Exercises  lying on foam roll UE elevation       Shoulder Exercises: Sidelying   Other Sidelying Exercises  open books right 10x       Shoulder Exercises: Stretch   Other Shoulder Stretches  foam roll vertical T stretch     Other Shoulder Stretches  childs pose foam roll 5x;  thread the needle with foam roll 5x       Moist Heat Therapy   Number Minutes Moist Heat  5 Minutes    Moist Heat Location  Shoulder      Manual Therapy   Soft tissue mobilization  right upper traps, rhomboids, levator scap, subscapularis, deltoids, pectorals        Trigger Point Dry Needling - 02/05/20 0001    Muscles Treated  Upper Quadrant  Pectoralis major;Pectoralis minor    Other Dry Needling  right     Upper Trapezius Response  Twitch reponse elicited;Palpable increased muscle length    Levator Scapulae Response  Twitch response elicited;Palpable increased muscle length    Pectoralis Major Response  Twitch response elicited;Palpable increased muscle length    Pectoralis Minor Response  Twitch response elicited;Palpable increased muscle length    Rhomboids Response  Palpable increased muscle length    Supraspinatus Response  Palpable increased muscle length    Infraspinatus Response  Palpable increased muscle length    Subscapularis Response  Palpable increased muscle length    Deltoid Response  Palpable increased muscle length             PT Short Term Goals - 01/30/20 1218      PT SHORT TERM GOAL #1   Title  The patient will report understanding of basic self care and HEP    Time  4    Period  Weeks    Status  New    Target Date  02/27/20      PT SHORT TERM GOAL  #2   Title  The patient will report a 30% improvement in shoulder discomfort    Time  4    Period  Weeks    Status  New      PT SHORT TERM GOAL #3   Title  ...      PT SHORT TERM GOAL #4   Title  ...        PT Long Term Goals - 01/30/20 1219      PT LONG TERM GOAL #1   Title  The patient will be independent in safe, self progression of HEP for further strengthening of right shoulder    Time  8    Period  Weeks    Status  New    Target Date  03/26/20      PT LONG TERM GOAL #2   Title  The patient will have 5/5 scapular strength needed for home and work tasks including activities that require weight bearing through UEs    Time  8    Period  Weeks    Status  New      PT LONG TERM GOAL #3   Title  The patient will report a 60% improvement in right shoulder discomfort with lifting and other home/work ADLS    Time  8    Period  Weeks    Status  New      PT LONG TERM GOAL #4   Title  FOTO functional outcome score improved to 18% limitation    Period  Weeks    Status  New      PT LONG TERM GOAL #5   Title  ...            Plan - 02/05/20 1523    Clinical Impression Statement  The patient has improved scapular dissociation from rib cage indicating better myofascial mobility.  She does have several tender points in right upper traps, levator and middle deltoid but notable decrease size and number following treatment session.  Positive response to pectoral stretching and posterior capsule stretching with foam roll.  Therapist closely monitoring response with all treatment interventions.    Rehab Potential  Good    PT Frequency  2x / week    PT Duration  8 weeks    PT Treatment/Interventions  ADLs/Self Care Home Management;Electrical Stimulation;Cryotherapy;Ultrasound;Moist Heat;Iontophoresis 4mg /ml Dexamethasone;Therapeutic activities;Therapeutic  exercise;Neuromuscular re-education;Manual techniques;Patient/family education;Dry needling;Taping    PT Next Visit Plan   assess response to DN;  taping; scapular strengthening;  HEP add pectoral stretches and scapular strengthening       Patient will benefit from skilled therapeutic intervention in order to improve the following deficits and impairments:  Increased fascial restricitons, Pain, Impaired UE functional use, Increased muscle spasms, Decreased strength  Visit Diagnosis: Chronic right shoulder pain  Cramp and spasm  Muscle weakness (generalized)     Problem List Patient Active Problem List   Diagnosis Date Noted  . Depression 05/22/2019  . Vitamin D deficiency 12/03/2018  . Class 1 obesity with serious comorbidity and body mass index (BMI) of 31.0 to 31.9 in adult 12/03/2018   Ruben Im, PT 02/05/20 3:37 PM Phone: [phone] Fax: [phone] [name] 02/05/2020, 3:37 PM  [facility] [address], [place], [place], [address] Phone: [phone]   Fax:  [phone]  Name: [name] MRN: [id number] Date of Birth: [birth year]

## 2020-02-13 ENCOUNTER — Ambulatory Visit: Payer: 59 | Admitting: Physical Therapy

## 2020-02-13 ENCOUNTER — Ambulatory Visit (INDEPENDENT_AMBULATORY_CARE_PROVIDER_SITE_OTHER): Payer: 59 | Admitting: Professional

## 2020-02-13 ENCOUNTER — Other Ambulatory Visit: Payer: Self-pay

## 2020-02-13 DIAGNOSIS — G8929 Other chronic pain: Secondary | ICD-10-CM

## 2020-02-13 DIAGNOSIS — M25511 Pain in right shoulder: Secondary | ICD-10-CM

## 2020-02-13 DIAGNOSIS — R252 Cramp and spasm: Secondary | ICD-10-CM

## 2020-02-13 DIAGNOSIS — F411 Generalized anxiety disorder: Secondary | ICD-10-CM | POA: Diagnosis not present

## 2020-02-13 DIAGNOSIS — M6281 Muscle weakness (generalized): Secondary | ICD-10-CM

## 2020-02-13 NOTE — Patient Instructions (Signed)
Access Code: [email or web address] URL: [email or web address] Date: 02/13/2020 Prepared by: Ruben Im  Exercises Seated Levator Scapulae Stretch - 1 x daily - 7 x weekly - 1 sets - 3 reps - 20 hold Push Up on Table - 1 x daily - 7 x weekly - 1 sets - 10 reps Standing Plank on Wall with Reaches and Resistance - 1 x daily - 7 x weekly - 1 sets - 10 reps Wall Clock with Theraband - 1 x daily - 7 x weekly - 1 sets - 10 reps Mini Band Reach Overhead Full ROM - 1 x daily - 7 x weekly - 1 sets - 10 reps

## 2020-02-13 NOTE — Therapy (Signed)
[facility] [address], [place], [place], [address] Phone: [phone]   Fax:  [phone]  Physical Therapy Treatment  Patient Details  Name: [name] MRN: [id number] Date of Birth: 1971-11-22 Referring Provider (PT): Shelle Iron PA   Encounter Date: 02/13/2020  PT End of Session - 02/13/20 1213    Visit Number  3    Date for PT Re-Evaluation  03/26/20    Authorization Type  [employer] Choice    PT Start Time  0930    PT Stop Time  1020    PT Time Calculation (min)  50 min    Activity Tolerance  Patient tolerated treatment well       Past Medical History:  Diagnosis Date  . Anemia   . Back pain   . Depression    followed by [name]  . Gallbladder problem   . GERD (gastroesophageal reflux disease)   . History of kidney problems    1975, eval by 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 Kidney problem   . Migraines    [facility]  . Palpitations   . Vitamin D deficiency     Past Surgical History:  Procedure Laterality Date  . CESAREAN SECTION  2007 & 2009  . CHOLECYSTECTOMY  2009  . CYSTOSCOPY  age 30  . SHOULDER ARTHROSCOPY WITH SUBACROMIAL DECOMPRESSION, ROTATOR CUFF REPAIR AND BICEP TENDON REPAIR Right 02/11/2016   Procedure: RIGHT SHOULDER ARTHROSCOPY WITH SUBACROMIAL DECOMPRESSION, OPEN DCR,  OPEN MINI ROTATOR CUFF REPAIR, LABRAL REPAIR;  Surgeon: Netta Cedars, MD;  Location: [place];  Service: Orthopedics;  Laterality: Right;  . TUBAL LIGATION    . uterine ablation      There were no vitals filed for this visit.  Subjective Assessment - 02/13/20 0932    Subjective  Not too much soreness.   Tightness upper arm, upper trap at work yesterday.  Just noticeable. I need stretching.    Currently in  Pain?  Yes    Pain Score  2     Pain Location  Shoulder    Pain Orientation  Right    Pain Type  Chronic pain                       [facility] Adult PT Treatment/Exercise - 02/13/20 0001      Shoulder Exercises: Standing   Flexion  Strengthening;Both;10 reps    Flexion Limitations  with red band around wrists     Other Standing Exercises  discussion of current HEP       Shoulder Exercises: ROM/Strengthening   Wall Pushups Limitations  counter push ups, side steps on counter 10x each     Other ROM/Strengthening Exercises  red band steering wheels 10x     Other ROM/Strengthening Exercises  red band around wrists side step and clocks 10x each       Shoulder Exercises: Stretch   Cross Chest Stretch Limitations  wall levator scap stretch with ball myofascial release 3x 20 sec     Other Shoulder Stretches  childs pose foam roll 5x;  thread the needle with foam roll 5x       Moist Heat Therapy   Number Minutes Moist Heat  5 Minutes    Moist Heat  Location  Shoulder      Manual Therapy   Soft tissue mobilization  right upper trap, rhomboids, deltoid, levator scap       Trigger Point Dry Needling - 02/13/20 0001    Consent Given?  Yes    Other Dry Needling  right     Upper Trapezius Response  Twitch reponse elicited;Palpable increased muscle length    Levator Scapulae Response  Twitch response elicited;Palpable increased muscle length    Rhomboids Response  Palpable increased muscle length    Supraspinatus Response  Palpable increased muscle length    Infraspinatus Response  Palpable increased muscle length    Deltoid Response  Palpable increased muscle length           PT Education - 02/13/20 1212    Education Details  Access Code: [email or web address]  band around wrists overhead. wall clocks; levator stretch at wall with lacrosse ball    Person(s) Educated  Patient    Methods  Explanation;Demonstration;Handout    Comprehension  Returned demonstration;Verbalized  understanding       PT Short Term Goals - 01/30/20 1218      PT SHORT TERM GOAL #1   Title  The patient will report understanding of basic self care and HEP    Time  4    Period  Weeks    Status  New    Target Date  02/27/20      PT SHORT TERM GOAL #2   Title  The patient will report a 30% improvement in shoulder discomfort    Time  4    Period  Weeks    Status  New      PT SHORT TERM GOAL #3   Title  ...      PT SHORT TERM GOAL #4   Title  ...        PT Long Term Goals - 01/30/20 1219      PT LONG TERM GOAL #1   Title  The patient will be independent in safe, self progression of HEP for further strengthening of right shoulder    Time  8    Period  Weeks    Status  New    Target Date  03/26/20      PT LONG TERM GOAL #2   Title  The patient will have 5/5 scapular strength needed for home and work tasks including activities that require weight bearing through UEs    Time  8    Period  Weeks    Status  New      PT LONG TERM GOAL #3   Title  The patient will report a 60% improvement in right shoulder discomfort with lifting and other home/work ADLS    Time  8    Period  Weeks    Status  New      PT LONG TERM GOAL #4   Title  FOTO functional outcome score improved to 18% limitation    Period  Weeks    Status  New      PT LONG TERM GOAL #5   Title  ...            Plan - 02/13/20 1214    Clinical Impression Statement  The patient has tender points particularly in levator scap, upper trap and middle deltoid muscles. She responds well to dry needling and manual therapy with twitch reponses produced ( a good prognostic indicator).  She is compliant with regular ex at home and we  discussed focus areas including levator trigger point management, shoulder capsular stretching and scapular stabilization/strengthening.  Therapist monitoring response with all treatment interventions.    Rehab Potential  Good    PT Frequency  2x / Coleman    PT Duration  8 weeks    PT  Treatment/Interventions  ADLs/Self Care Home Management;Electrical Stimulation;Cryotherapy;Ultrasound;Moist Heat;Iontophoresis 4mg /ml Dexamethasone;Therapeutic activities;Therapeutic exercise;Neuromuscular re-education;Manual techniques;Patient/family education;Dry needling;Taping    PT Next Visit Plan  assess response to DN;  taping; scapular strengthening;  HEP;  may decrease treatment frequency to 1x/Coleman    PT Home Exercise Plan  Access Code: [email or web address]       Patient will benefit from skilled therapeutic intervention in order to improve the following deficits and impairments:  Increased fascial restricitons, Pain, Impaired UE functional use, Increased muscle spasms, Decreased strength  Visit Diagnosis: Chronic right shoulder pain  Cramp and spasm  Muscle weakness (generalized)     Problem List Patient Active Problem List   Diagnosis Date Noted  . Depression 05/22/2019  . Vitamin D deficiency 12/03/2018  . Class 1 obesity with serious comorbidity and body mass index (BMI) of 31.0 to 31.9 in adult 12/03/2018   Ruben Im, PT 02/13/20 3:52 PM Phone: [phone] Fax: [phone] [name] 02/13/2020, 3:52 PM  [facility] [address] [place], [place], [address] Phone: [phone]   Fax:  [phone]  Name: FRANCY MONT MRN: [id number] Date of Birth: Jan 31, 1971

## 2020-02-17 ENCOUNTER — Ambulatory Visit: Payer: 59 | Admitting: Physical Therapy

## 2020-02-17 ENCOUNTER — Other Ambulatory Visit: Payer: Self-pay

## 2020-02-17 DIAGNOSIS — R252 Cramp and spasm: Secondary | ICD-10-CM

## 2020-02-17 DIAGNOSIS — M6281 Muscle weakness (generalized): Secondary | ICD-10-CM | POA: Diagnosis not present

## 2020-02-17 DIAGNOSIS — H524 Presbyopia: Secondary | ICD-10-CM | POA: Diagnosis not present

## 2020-02-17 DIAGNOSIS — M25511 Pain in right shoulder: Secondary | ICD-10-CM | POA: Diagnosis not present

## 2020-02-17 DIAGNOSIS — G8929 Other chronic pain: Secondary | ICD-10-CM

## 2020-02-17 NOTE — Therapy (Signed)
[facility] [address] [place], [place], [address] Phone: [phone]   Fax:  [phone]  Physical Therapy Treatment  Patient Details  Name: [name] MRN: [id number] Date of Birth: [birth year] Referring Provider (PT): Shelle Iron PA   Encounter Date: 02/17/2020  PT End of Session - 02/17/20 1355    Visit Number  4    Date for PT Re-Evaluation  03/26/20    Authorization Type  [employer] Choice    PT Start Time  1230    PT Stop Time  1310   DN and heat   PT Time Calculation (min)  40 min    Activity Tolerance  Patient tolerated treatment well       Past Medical History:  Diagnosis Date  . Anemia   . Back pain   . Depression    followed by [name]  . Gallbladder problem   . GERD (gastroesophageal reflux disease)   . History of kidney problems    1975, eval by 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 Kidney problem   . Migraines    [facility]  . Palpitations   . Vitamin D deficiency     Past Surgical History:  Procedure Laterality Date  . CESAREAN SECTION  2007 & 2009  . CHOLECYSTECTOMY  2009  . CYSTOSCOPY  age 89  . SHOULDER ARTHROSCOPY WITH SUBACROMIAL DECOMPRESSION, ROTATOR CUFF REPAIR AND BICEP TENDON REPAIR Right 02/11/2016   Procedure: RIGHT SHOULDER ARTHROSCOPY WITH SUBACROMIAL DECOMPRESSION, OPEN DCR,  OPEN MINI ROTATOR CUFF REPAIR, LABRAL REPAIR;  Surgeon: Netta Cedars, MD;  Location: [facility];  Service: Orthopedics;  Laterality: Right;  . TUBAL LIGATION    . uterine ablation      There were no vitals filed for this visit.  Subjective Assessment - 02/17/20 1235    Subjective  That tight band is not in my deltoid now.  Just feels tight in levator/upper trap region.    Currently in Pain?   No/denies    Pain Score  1     Pain Location  Shoulder    Pain Orientation  Right                       [facility] Adult PT Treatment/Exercise - 02/17/20 0001      Shoulder Exercises: Sidelying   Other Sidelying Exercises  open books right 10x       Shoulder Exercises: Stretch   Corner Stretch Limitations  doorway pec stretch 3x 20 sec     Other Shoulder Stretches  levator self stretch 3x 20 sec     Other Shoulder Stretches  on wall bil UE overhead 10x       Moist Heat Therapy   Number Minutes Moist Heat  5 Minutes    Moist Heat Location  Shoulder      Manual Therapy   Soft tissue mobilization  right upper trap, rhomboids, deltoid, levator scap       Trigger Point Dry Needling - 02/17/20 0001    Consent Given?  Yes    Other Dry Needling  right     Upper Trapezius Response  Twitch reponse elicited;Palpable increased muscle length    Levator Scapulae Response  Twitch response elicited;Palpable increased muscle length  PT Short Term Goals - 02/17/20 1414      PT SHORT TERM GOAL #1   Title  The patient will report understanding of basic self care and HEP    Status  Achieved      PT SHORT TERM GOAL #2   Title  The patient will report a 30% improvement in shoulder discomfort    Status  Achieved        PT Long Term Goals - 01/30/20 1219      PT LONG TERM GOAL #1   Title  The patient will be independent in safe, self progression of HEP for further strengthening of right shoulder    Time  8    Period  Weeks    Status  New    Target Date  03/26/20      PT LONG TERM GOAL #2   Title  The patient will have 5/5 scapular strength needed for home and work tasks including activities that require weight bearing through UEs    Time  8    Period  Weeks    Status  New      PT LONG TERM GOAL #3   Title  The patient will report a 60% improvement in right shoulder discomfort with lifting and other home/work ADLS    Time  8    Period  Weeks     Status  New      PT LONG TERM GOAL #4   Title  FOTO functional outcome score improved to 18% limitation    Period  Weeks    Status  New      PT LONG TERM GOAL #5   Title  ...            Plan - 02/17/20 1358    Clinical Impression Statement  The patient has fewer tender points overall with none identified in deltoids or medial and inferior scapular muscles.  She does have moderately sized upper trap and levator scap trigger points which radiate into her neck when activated.  She has a positive response to dry needling and mnaual therapy with numerous twitch responses and improve soft tissue mobility following.  She demonstrates good compliance with home exercises with focus on both stretching and strengthening.  Will follow up in 2 weeks following her trip to [place].  STGS met.    Rehab Potential  Good    PT Frequency  2x / week    PT Duration  8 weeks    PT Treatment/Interventions  ADLs/Self Care Home Management;Electrical Stimulation;Cryotherapy;Ultrasound;Moist Heat;Iontophoresis 36m/ml Dexamethasone;Therapeutic activities;Therapeutic exercise;Neuromuscular re-education;Manual techniques;Patient/family education;Dry needling;Taping    PT Next Visit Plan  DN;  taping; scapular strengthening;  HEP;  may decrease treatment frequency to 1x/week    PT Home Exercise Plan  Access Code: [email or web address]      Patient will benefit from skilled therapeutic intervention in order to improve the following deficits and impairments:  Increased fascial restricitons, Pain, Impaired UE functional use, Increased muscle spasms, Decreased strength  Visit Diagnosis: Chronic right shoulder pain  Cramp and spasm  Muscle weakness (generalized)     Problem List Patient Active Problem List   Diagnosis Date Noted  . Depression 05/22/2019  . Vitamin D deficiency 12/03/2018  . Class 1 obesity with serious comorbidity and body mass index (BMI) of 31.0 to 31.9 in adult 12/03/2018   SRuben Im  PT 02/17/20 2:16 PM Phone: [phone]: [phone] Singh3/23/2021, 2:15 PM  [facility]  [place] [address] [place], [place], [address] Phone: [phone]   Fax:  [phone]  Name: [name] MRN: [id number] Date of Birth: 17-Mar-1971

## 2020-02-19 ENCOUNTER — Encounter: Payer: Self-pay | Admitting: Physical Therapy

## 2020-03-02 ENCOUNTER — Other Ambulatory Visit: Payer: Self-pay

## 2020-03-02 ENCOUNTER — Ambulatory Visit: Payer: 59 | Attending: Physician Assistant | Admitting: Physical Therapy

## 2020-03-02 DIAGNOSIS — R252 Cramp and spasm: Secondary | ICD-10-CM | POA: Diagnosis not present

## 2020-03-02 DIAGNOSIS — M25511 Pain in right shoulder: Secondary | ICD-10-CM | POA: Diagnosis not present

## 2020-03-02 DIAGNOSIS — G8929 Other chronic pain: Secondary | ICD-10-CM | POA: Insufficient documentation

## 2020-03-02 DIAGNOSIS — M6281 Muscle weakness (generalized): Secondary | ICD-10-CM | POA: Insufficient documentation

## 2020-03-02 NOTE — Therapy (Addendum)
[facility] [address], [place], [place], [address] Phone: [phone]   Fax:  [phone]  Physical Therapy Treatment/Discharge Summary   Patient Details  Name: [name] MRN: [id number] Date of Birth: [birth year] Referring Provider (PT): Shelle Iron PA   Encounter Date: 03/02/2020  PT End of Session - 03/02/20 0933    Visit Number  5    Date for PT Re-Evaluation  03/26/20    Authorization Type  [facility] Choice    PT Start Time  0931    PT Stop Time  1013   dry needle /heat   PT Time Calculation (min)  42 min    Activity Tolerance  Patient tolerated treatment well       Past Medical History:  Diagnosis Date  . Anemia   . Back pain   . Depression    followed by [name]  . Gallbladder problem   . GERD (gastroesophageal reflux disease)   . History of kidney problems    1975, eval by 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 Kidney problem   . Migraines    [facility]  . Palpitations   . Vitamin D deficiency     Past Surgical History:  Procedure Laterality Date  . CESAREAN SECTION  2007 & 2009  . CHOLECYSTECTOMY  2009  . CYSTOSCOPY  age 38  . SHOULDER ARTHROSCOPY WITH SUBACROMIAL DECOMPRESSION, ROTATOR CUFF REPAIR AND BICEP TENDON REPAIR Right 02/11/2016   Procedure: RIGHT SHOULDER ARTHROSCOPY WITH SUBACROMIAL DECOMPRESSION, OPEN DCR,  OPEN MINI ROTATOR CUFF REPAIR, LABRAL REPAIR;  Surgeon: Netta Cedars, MD;  Location: [facility];  Service: Orthopedics;  Laterality: Right;  . TUBAL LIGATION    . uterine ablation      There were no vitals filed for this visit.  Subjective Assessment - 03/02/20 0934    Subjective  Shoulder is pretty good.  Always tight sore in upper trap/levator.  Did mostly cardio over the  break so no strengthening which sometimes bothers me.    Currently in Pain?  Yes    Pain Score  1     Pain Location  Shoulder    Pain Orientation  Right                       [facility] Adult PT Treatment/Exercise - 03/02/20 0001      Shoulder Exercises: ROM/Strengthening   Other ROM/Strengthening Exercises  verbal review of HEP       Moist Heat Therapy   Number Minutes Moist Heat  5 Minutes    Moist Heat Location  Shoulder      Manual Therapy   Soft tissue mobilization  right upper trap, rhomboids, deltoid, levator scap       Trigger Point Dry Needling - 03/02/20 0001    Consent Given?  Yes    Muscles Treated Head and Neck  Cervical multifidi    Other Dry Needling  right     Upper Trapezius Response  Twitch reponse elicited;Palpable increased muscle length    Levator Scapulae Response  Twitch response elicited;Palpable increased muscle length    Cervical multifidi Response  Twitch reponse elicited;Palpable increased muscle length    Infraspinatus Response  Palpable increased muscle length    Subscapularis Response  Palpable  increased muscle length             PT Short Term Goals - 03/02/20 1003      PT SHORT TERM GOAL #1   Title  The patient will report understanding of basic self care and HEP    Status  Achieved      PT SHORT TERM GOAL #2   Title  The patient will report a 30% improvement in shoulder discomfort    Status  Achieved        PT Long Term Goals - 03/02/20 1002      PT LONG TERM GOAL #1   Title  The patient will be independent in safe, self progression of HEP for further strengthening of right shoulder    Status  Achieved      PT LONG TERM GOAL #2   Title  The patient will have 5/5 scapular strength needed for home and work tasks including activities that require weight bearing through UEs    Time  8    Period  Weeks    Status  On-going      PT LONG TERM GOAL #3   Title  The patient will report a 60% improvement in right  shoulder discomfort with lifting and other home/work ADLS            Plan - 03/02/20 1859    Clinical Impression Statement  The patient has much improved soft tissue mobility secondary to decreased number of tender points particularly deltoids and subscapularis.  Tender points in upper traps and levator continue to present.  Added DN to right cervical multifidi which were quite sensitive and multiple twitch responses produced.  She should meet remaining goals in 2-3 visits.  She is concerned her trigger points may be aggravated as she will be packing to move over the next month.    Rehab Potential  Good    PT Frequency  2x / week    PT Duration  8 weeks    PT Treatment/Interventions  ADLs/Self Care Home Management;Electrical Stimulation;Cryotherapy;Ultrasound;Moist Heat;Iontophoresis '4mg'$ /ml Dexamethasone;Therapeutic activities;Therapeutic exercise;Neuromuscular re-education;Manual techniques;Patient/family education;Dry needling;Taping    PT Next Visit Plan  DN;  taping; scapular strengthening;  HEP;  may decrease treatment frequency to 1x/week    PT Home Exercise Plan  Access Code: [email or web address]       Patient will benefit from skilled therapeutic intervention in order to improve the following deficits and impairments:     Visit Diagnosis: Chronic right shoulder pain  Cramp and spasm  Muscle weakness (generalized)  PHYSICAL THERAPY DISCHARGE SUMMARY  Visits from Start of Care: 5  Current functional level related to goals / functional outcomes: The patient reports much improvement in pain on last visit.  She called to cancel remaining appts and  request discharge from PT.   Remaining deficits: As above   Education / Equipment: HEP  Plan: Patient agrees to discharge.  Patient goals were partially met. Patient is being discharged due to the patient's request.  ?????          Problem List Patient Active Problem List   Diagnosis Date Noted  . Depression 05/22/2019  .  Vitamin D deficiency 12/03/2018  . Class 1 obesity with serious comorbidity and body mass index (BMI) of 31.0 to 31.9 in adult 12/03/2018   Ruben Im, PT 03/02/20 7:07 PM Phone: [phone] Fax: [phone] [name] 03/02/2020, 7:06 PM  [facility] [address], [place], [place], [address] Phone:  [phone]   Fax:  [phone]  Name: [name] MRN: [id number] Date of Birth: [birth year]

## 2020-03-04 ENCOUNTER — Other Ambulatory Visit: Payer: Self-pay

## 2020-03-04 ENCOUNTER — Ambulatory Visit (INDEPENDENT_AMBULATORY_CARE_PROVIDER_SITE_OTHER): Payer: 59 | Admitting: Family Medicine

## 2020-03-04 ENCOUNTER — Encounter (INDEPENDENT_AMBULATORY_CARE_PROVIDER_SITE_OTHER): Payer: Self-pay | Admitting: Family Medicine

## 2020-03-04 VITALS — BP 110/67 | HR 67 | Temp 97.7°F | Ht 64.0 in | Wt 177.0 lb

## 2020-03-04 DIAGNOSIS — E559 Vitamin D deficiency, unspecified: Secondary | ICD-10-CM | POA: Diagnosis not present

## 2020-03-04 DIAGNOSIS — F3289 Other specified depressive episodes: Secondary | ICD-10-CM | POA: Diagnosis not present

## 2020-03-04 DIAGNOSIS — E669 Obesity, unspecified: Secondary | ICD-10-CM | POA: Diagnosis not present

## 2020-03-04 DIAGNOSIS — Z6830 Body mass index (BMI) 30.0-30.9, adult: Secondary | ICD-10-CM

## 2020-03-04 MED ORDER — BUPROPION HCL ER (SR) 150 MG PO TB12: 150.0000 mg | ORAL_TABLET | Freq: Every day | ORAL | 0 refills | Status: DC

## 2020-03-04 MED ORDER — VITAMIN D 125 MCG (5000 UT) PO CAPS: 1.0000 | ORAL_CAPSULE | Freq: Every day | ORAL | 0 refills | Status: DC

## 2020-03-04 MED FILL — BUPROPION HCL SR 150 MG TAB: 150 | 30 days supply | Qty: 30 | Fill #0

## 2020-03-04 NOTE — Progress Notes (Signed)
Chief Complaint:   OBESITY [name] is here to discuss her progress with her obesity treatment plan along with follow-up of her obesity related diagnoses. [name] is on following a lower carbohydrate, vegetable and lean protein rich diet plan and states she is following her eating plan approximately 80% of the time. [name] states she is doing cardio and strength training for 30-40 minutes 5-6 times per week.  Today's visit was #: 27 Starting weight: 191 lbs Starting date: 10/16/2018 Today's weight: 177 lbs Today's date: 03/04/2020 Total lbs lost to date: 14 Total lbs lost since last in-office visit: 4  Interim History: [name] was on vacation last week and off plan. She is now back on the Low carbohydrate plan and is sticking to it very well. She is now feeling better on the Low carbohydrate plan. She had felt fatigued before. She denies hunger and reports less hunger overall on the Low carbohydrate.  Subjective:   1. Vitamin D deficiency [name] is now daily Vit D as directed. Last Vit D was low at 32. She is on OTC Vit D 5,000 daily.  2. Other depression, with emotional eating [name] notes her stress eating is well controlled with bupropion.  Assessment/Plan:   1. Vitamin D deficiency Low Vitamin D level contributes to fatigue and are associated with obesity, breast, and colon cancer. Kerrah agreed to continue taking OTC Vit D and will follow-up for routine testing of Vitamin D, at least 2-3 times per year to avoid over-replacement.  2. Other depression, with emotional eating Behavior modification techniques were discussed today to help [name] deal with her emotional/non-hunger eating behaviors. We will refill bupropion SR for 1 month. Orders and follow up as documented in patient record.   - buPROPion (WELLBUTRIN SR) 150 MG 12 hr tablet; Take 1 tablet (150 mg total) by mouth daily.  Dispense: 30 tablet; Refill: 0  3. Class 1 obesity with serious comorbidity and body mass  index (BMI) of 30.0 to 30.9 in adult, unspecified obesity type [name] is currently in the action stage of change. As such, her goal is to continue with weight loss efforts. She has agreed to following a lower carbohydrate, vegetable and lean protein rich diet plan.   Exercise goals: As is.  Behavioral modification strategies: decreasing simple carbohydrates.  [name] has agreed to follow-up with our clinic in 3 weeks. She was informed of the importance of frequent follow-up visits to maximize her success with intensive lifestyle modifications for her multiple health conditions.   Objective:   Blood pressure 110/67, pulse 67, temperature 97.7 F (36.5 C), temperature source Oral, height 5\' 4"  (1.626 m), weight 177 lb (80.3 kg), last menstrual period 02/23/2020, SpO2 97 %. Body mass index is 30.38 kg/m.  General: Cooperative, alert, well developed, in no acute distress. HEENT: Conjunctivae and lids unremarkable. Cardiovascular: Regular rhythm.  Lungs: Normal work of breathing. Neurologic: No focal deficits.   Lab Results  Component Value Date   CREATININE 0.73 12/22/2019   BUN 14 12/22/2019   NA 143 12/22/2019   K 4.9 12/22/2019   CL 106 12/22/2019   CO2 23 12/22/2019   Lab Results  Component Value Date   ALT 11 12/22/2019   AST 13 12/22/2019   ALKPHOS 54 12/22/2019   BILITOT 0.3 12/22/2019   Lab Results  Component Value Date   HGBA1C 4.7 (L) 12/22/2019   HGBA1C 5.0 10/16/2018   Lab Results  Component Value Date   INSULIN 6.5 12/22/2019   INSULIN 3.6  10/16/2018   Lab Results  Component Value Date   TSH 3.890 12/22/2019   Lab Results  Component Value Date   CHOL 158 12/22/2019   HDL 51 12/22/2019   LDLCALC 96 12/22/2019   TRIG 54 12/22/2019   Lab Results  Component Value Date   WBC 6.4 12/22/2019   HGB 13.6 12/22/2019   HCT 40.2 12/22/2019   MCV 94 12/22/2019   PLT 242 12/22/2019   No results found for: IRON, TIBC, FERRITIN  Attestation Statements:     Reviewed by clinician on day of visit: allergies, medications, problem list, medical history, surgical history, family history, social history, and previous encounter notes.   Wilhemena Durie, am acting as Location manager for [employer], FNP-C.  I have reviewed the above documentation for accuracy and completeness, and I agree with the above. -  Georgianne Fick, FNP

## 2020-03-05 ENCOUNTER — Ambulatory Visit: Payer: 59 | Admitting: Professional

## 2020-03-09 ENCOUNTER — Encounter: Payer: 59 | Admitting: Physical Therapy

## 2020-03-13 ENCOUNTER — Ambulatory Visit (INDEPENDENT_AMBULATORY_CARE_PROVIDER_SITE_OTHER): Payer: 59 | Admitting: Professional

## 2020-03-13 DIAGNOSIS — F411 Generalized anxiety disorder: Secondary | ICD-10-CM

## 2020-03-16 ENCOUNTER — Encounter: Payer: 59 | Admitting: Physical Therapy

## 2020-03-23 ENCOUNTER — Encounter: Payer: 59 | Admitting: Physical Therapy

## 2020-03-24 ENCOUNTER — Ambulatory Visit (INDEPENDENT_AMBULATORY_CARE_PROVIDER_SITE_OTHER): Payer: 59 | Admitting: Family Medicine

## 2020-03-31 ENCOUNTER — Encounter (INDEPENDENT_AMBULATORY_CARE_PROVIDER_SITE_OTHER): Payer: Self-pay | Admitting: Family Medicine

## 2020-03-31 ENCOUNTER — Ambulatory Visit (INDEPENDENT_AMBULATORY_CARE_PROVIDER_SITE_OTHER): Payer: 59 | Admitting: Family Medicine

## 2020-03-31 ENCOUNTER — Other Ambulatory Visit: Payer: Self-pay

## 2020-03-31 VITALS — BP 109/70 | HR 62 | Temp 97.7°F | Ht 64.0 in | Wt 175.0 lb

## 2020-03-31 DIAGNOSIS — E559 Vitamin D deficiency, unspecified: Secondary | ICD-10-CM

## 2020-03-31 DIAGNOSIS — Z6830 Body mass index (BMI) 30.0-30.9, adult: Secondary | ICD-10-CM | POA: Diagnosis not present

## 2020-03-31 DIAGNOSIS — F3289 Other specified depressive episodes: Secondary | ICD-10-CM | POA: Diagnosis not present

## 2020-03-31 DIAGNOSIS — E669 Obesity, unspecified: Secondary | ICD-10-CM | POA: Diagnosis not present

## 2020-03-31 MED ORDER — BUPROPION HCL ER (SR) 150 MG PO TB12: 150.0000 mg | ORAL_TABLET | Freq: Two times a day (BID) | ORAL | 0 refills | Status: DC

## 2020-03-31 MED FILL — BUPROPION HCL SR 150 MG TAB: 150 | 30 days supply | Qty: 60 | Fill #0

## 2020-04-01 ENCOUNTER — Encounter (INDEPENDENT_AMBULATORY_CARE_PROVIDER_SITE_OTHER): Payer: Self-pay | Admitting: Family Medicine

## 2020-04-01 NOTE — Progress Notes (Signed)
Chief Complaint:   OBESITY [name] is here to discuss her progress with her obesity treatment plan along with follow-up of her obesity related diagnoses. [name] is on following a lower carbohydrate, vegetable and lean protein rich diet plan and states she is following her eating plan approximately 50% of the time. [name] states she is walking and doing yoga for 30-45 minutes 5-6 times per week.  Today's visit was #: 28 Starting weight: 191 lbs Starting date: 10/16/2018 Today's weight: 175 lbs Today's date: 04/01/2020 Total lbs lost to date: 16 Total lbs lost since last in-office visit: 2  Interim History: [name] admits to being off the plan the last few weeks due to stress. She is surprised she lost weight today. She is moving and has been packing. She notes increased stress eating.  Subjective:   1. Vitamin D deficiency [name]'s last Vit D level was 32 (low). She is on OTC Vit D 5,000 daily.  2. Other depression, with emotional eating [name] notes cravings are exacerbated currently by stress currently. She is on bupropion 150 mg q AM. She is able to talk herself out of stress eating sometimes.  Assessment/Plan:   1. Vitamin D deficiency Low Vitamin D level contributes to fatigue and are associated with obesity, breast, and colon cancer. Kersha agreed to continue taking OTC Vit D 5,00 IU daily. She will follow-up for routine testing of Vitamin D, at least 2-3 times per year to avoid over-replacement.  2. Other depression, with emotional eating Behavior modification techniques were discussed today to help [name] deal with her emotional/non-hunger eating behaviors. [name] agreed to increase bupropion SR to 150 mg BID with no refills. Orders and follow up as documented in patient record.   - buPROPion (WELLBUTRIN SR) 150 MG 12 hr tablet; Take 1 tablet (150 mg total) by mouth 2 (two) times daily.  Dispense: 60 tablet; Refill: 0  3. Class 1 obesity with serious comorbidity  and body mass index (BMI) of 30.0 to 30.9 in adult, unspecified obesity type [name] is currently in the action stage of change. As such, her goal is to continue with weight loss efforts. She has agreed to following a lower carbohydrate, vegetable and lean protein rich diet plan.   Exercise goals: As is.  Behavioral modification strategies: decreasing simple carbohydrates.  [name] has agreed to follow-up with our clinic in 3 weeks. She was informed of the importance of frequent follow-up visits to maximize her success with intensive lifestyle modifications for her multiple health conditions.   Objective:   Blood pressure 109/70, pulse 62, temperature 97.7 F (36.5 C), temperature source Oral, height 5\' 4"  (1.626 m), weight 175 lb (79.4 kg), last menstrual period 03/24/2020, SpO2 100 %. Body mass index is 30.04 kg/m.  General: Cooperative, alert, well developed, in no acute distress. HEENT: Conjunctivae and lids unremarkable. Cardiovascular: Regular rhythm.  Lungs: Normal work of breathing. Neurologic: No focal deficits.   Lab Results  Component Value Date   CREATININE 0.73 12/22/2019   BUN 14 12/22/2019   NA 143 12/22/2019   K 4.9 12/22/2019   CL 106 12/22/2019   CO2 23 12/22/2019   Lab Results  Component Value Date   ALT 11 12/22/2019   AST 13 12/22/2019   ALKPHOS 54 12/22/2019   BILITOT 0.3 12/22/2019   Lab Results  Component Value Date   HGBA1C 4.7 (L) 12/22/2019   HGBA1C 5.0 10/16/2018   Lab Results  Component Value Date   INSULIN 6.5 12/22/2019  INSULIN 3.6 10/16/2018   Lab Results  Component Value Date   TSH 3.890 12/22/2019   Lab Results  Component Value Date   CHOL 158 12/22/2019   HDL 51 12/22/2019   LDLCALC 96 12/22/2019   TRIG 54 12/22/2019   Lab Results  Component Value Date   WBC 6.4 12/22/2019   HGB 13.6 12/22/2019   HCT 40.2 12/22/2019   MCV 94 12/22/2019   PLT 242 12/22/2019   No results found for: IRON, TIBC, FERRITIN   Attestation Statements:   Reviewed by clinician on day of visit: allergies, medications, problem list, medical history, surgical history, family history, social history, and previous encounter notes.   Wilhemena Durie, am acting as Location manager for [employer], FNP-C.  I have reviewed the above documentation for accuracy and completeness, and I agree with the above. -  Georgianne Fick, FNP

## 2020-04-20 ENCOUNTER — Ambulatory Visit (INDEPENDENT_AMBULATORY_CARE_PROVIDER_SITE_OTHER): Payer: 59 | Admitting: Family Medicine

## 2020-04-29 ENCOUNTER — Encounter (INDEPENDENT_AMBULATORY_CARE_PROVIDER_SITE_OTHER): Payer: Self-pay | Admitting: Family Medicine

## 2020-04-29 ENCOUNTER — Other Ambulatory Visit: Payer: Self-pay

## 2020-04-29 ENCOUNTER — Ambulatory Visit (INDEPENDENT_AMBULATORY_CARE_PROVIDER_SITE_OTHER): Payer: 59 | Admitting: Family Medicine

## 2020-04-29 VITALS — BP 103/70 | HR 68 | Temp 98.0°F | Ht 64.0 in | Wt 176.0 lb

## 2020-04-29 DIAGNOSIS — E669 Obesity, unspecified: Secondary | ICD-10-CM

## 2020-04-29 DIAGNOSIS — Z6830 Body mass index (BMI) 30.0-30.9, adult: Secondary | ICD-10-CM | POA: Diagnosis not present

## 2020-04-29 DIAGNOSIS — E559 Vitamin D deficiency, unspecified: Secondary | ICD-10-CM

## 2020-04-29 NOTE — Progress Notes (Signed)
Chief Complaint:   OBESITY [name] is here to discuss her progress with her obesity treatment plan along with follow-up of her obesity related diagnoses. [name] is on following a lower carbohydrate, vegetable and lean protein rich diet plan and states she is following her eating plan approximately 30% of the time. [name] states she is walking for 30 minutes 3-4 times per week.  Today's visit was #: 4 Starting weight: 191 lbs Starting date: 10/16/2018 Today's weight: 176 lbs Today's date: 04/29/2020 Total lbs lost to date: 15 Total lbs lost since last in-office visit: 0  Interim History: [name] moved 2 weeks ago and it has been quite chaotic at her house. She now has her kitchen unpacked and is now back on the plan. She still likes the Low carbohydrate plan. She reports her hunger is satisfied on the plan.  Subjective:   1. Vitamin D deficiency [name]'s Vit D level is at goal. She is on OTC Vit D 5,000 IU daily.  Assessment/Plan:   1. Vitamin D deficiency Low Vitamin D level contributes to fatigue and are associated with obesity, breast, and colon cancer. [name] agreed to continue taking OTC Vit D 5,000 IU every week and will follow-up for routine testing of Vitamin D, at least 2-3 times per year to avoid over-replacement.  2. Class 1 obesity with serious comorbidity and body mass index (BMI) of 30.0 to 30.9 in adult, unspecified obesity type [name] is currently in the action stage of change. As such, her goal is to continue with weight loss efforts. She has agreed to following a lower carbohydrate, vegetable and lean protein rich diet plan.   Exercise goals: As is.  Behavioral modification strategies: decreasing simple carbohydrates.  [name] has agreed to follow-up with our clinic in 3 to 4 weeks. She was informed of the importance of frequent follow-up visits to maximize her success with intensive lifestyle modifications for her multiple health conditions.    Objective:   Blood pressure 103/70, pulse 68, temperature 98 F (36.7 C), temperature source Oral, height 5\' 4"  (1.626 m), weight 176 lb (79.8 kg), SpO2 98 %. Body mass index is 30.21 kg/m.  General: Cooperative, alert, well developed, in no acute distress. HEENT: Conjunctivae and lids unremarkable. Cardiovascular: Regular rhythm.  Lungs: Normal work of breathing. Neurologic: No focal deficits.   Lab Results  Component Value Date   CREATININE 0.73 12/22/2019   BUN 14 12/22/2019   NA 143 12/22/2019   K 4.9 12/22/2019   CL 106 12/22/2019   CO2 23 12/22/2019   Lab Results  Component Value Date   ALT 11 12/22/2019   AST 13 12/22/2019   ALKPHOS 54 12/22/2019   BILITOT 0.3 12/22/2019   Lab Results  Component Value Date   HGBA1C 4.7 (L) 12/22/2019   HGBA1C 5.0 10/16/2018   Lab Results  Component Value Date   INSULIN 6.5 12/22/2019   INSULIN 3.6 10/16/2018   Lab Results  Component Value Date   TSH 3.890 12/22/2019   Lab Results  Component Value Date   CHOL 158 12/22/2019   HDL 51 12/22/2019   LDLCALC 96 12/22/2019   TRIG 54 12/22/2019   Lab Results  Component Value Date   WBC 6.4 12/22/2019   HGB 13.6 12/22/2019   HCT 40.2 12/22/2019   MCV 94 12/22/2019   PLT 242 12/22/2019   No results found for: IRON, TIBC, FERRITIN  Attestation Statements:   Reviewed by clinician on day of visit: allergies, medications, problem list,  medical history, surgical history, family history, social history, and previous encounter notes.   Wilhemena Durie, am acting as Location manager for [employer], FNP-C.  I have reviewed the above documentation for accuracy and completeness, and I agree with the above. -  Georgianne Fick, FNP

## 2020-05-03 ENCOUNTER — Encounter (INDEPENDENT_AMBULATORY_CARE_PROVIDER_SITE_OTHER): Payer: Self-pay | Admitting: Family Medicine

## 2020-05-10 ENCOUNTER — Ambulatory Visit: Payer: 59 | Admitting: Professional

## 2020-05-14 ENCOUNTER — Ambulatory Visit (INDEPENDENT_AMBULATORY_CARE_PROVIDER_SITE_OTHER): Payer: 59 | Admitting: Professional

## 2020-05-14 DIAGNOSIS — F411 Generalized anxiety disorder: Secondary | ICD-10-CM | POA: Diagnosis not present

## 2020-05-25 ENCOUNTER — Ambulatory Visit (INDEPENDENT_AMBULATORY_CARE_PROVIDER_SITE_OTHER): Payer: 59 | Admitting: Family Medicine

## 2020-05-27 ENCOUNTER — Ambulatory Visit (INDEPENDENT_AMBULATORY_CARE_PROVIDER_SITE_OTHER): Payer: 59 | Admitting: Family Medicine

## 2020-05-27 ENCOUNTER — Encounter (INDEPENDENT_AMBULATORY_CARE_PROVIDER_SITE_OTHER): Payer: Self-pay | Admitting: Family Medicine

## 2020-05-27 ENCOUNTER — Other Ambulatory Visit: Payer: Self-pay

## 2020-05-27 VITALS — BP 93/61 | HR 68 | Temp 97.9°F | Ht 64.0 in | Wt 177.0 lb

## 2020-05-27 DIAGNOSIS — E669 Obesity, unspecified: Secondary | ICD-10-CM | POA: Diagnosis not present

## 2020-05-27 DIAGNOSIS — E559 Vitamin D deficiency, unspecified: Secondary | ICD-10-CM | POA: Diagnosis not present

## 2020-05-27 DIAGNOSIS — Z6830 Body mass index (BMI) 30.0-30.9, adult: Secondary | ICD-10-CM | POA: Diagnosis not present

## 2020-05-27 DIAGNOSIS — E8881 Metabolic syndrome: Secondary | ICD-10-CM | POA: Diagnosis not present

## 2020-05-28 LAB — INSULIN, RANDOM: INSULIN: 6 u[IU]/mL (ref 2.6–24.9)

## 2020-05-28 LAB — VITAMIN D 25 HYDROXY (VIT D DEFICIENCY, FRACTURES): Vit D, 25-Hydroxy: 55.4 ng/mL (ref 30.0–100.0)

## 2020-06-02 ENCOUNTER — Encounter (INDEPENDENT_AMBULATORY_CARE_PROVIDER_SITE_OTHER): Payer: Self-pay | Admitting: Family Medicine

## 2020-06-02 DIAGNOSIS — E88819 Insulin resistance, unspecified: Secondary | ICD-10-CM | POA: Insufficient documentation

## 2020-06-02 DIAGNOSIS — E8881 Metabolic syndrome: Secondary | ICD-10-CM | POA: Insufficient documentation

## 2020-06-02 NOTE — Progress Notes (Signed)
Chief Complaint:   OBESITY [name] is here to discuss her progress with her obesity treatment plan along with follow-up of her obesity related diagnoses. [name] is on following a lower carbohydrate, vegetable and lean protein rich diet plan and states she is following her eating plan approximately 50% of the time. [name] states she is walking and doing strength training for 30-60 minutes 3-4 times per week.  Today's visit was #: 30 Starting weight: 191 lbs Starting date: 10/16/2018 Today's weight: 177 lbs Today's date: 05/27/2020 Total lbs lost to date: 14 Total lbs lost since last in-office visit: 0  Interim History: [name] has had a few social gatherings recently and she is happy with her 1 lb weight gain because she thought she would gain more.. She notes skipping some meals and she is not eating enough protein. She has indulged in some carbohydrates.  She will be going to [place] next week for vacation.  Subjective:   1. Vitamin D deficiency [name]'s last Vit D level was low at 32. She is on daily OTC Vit D 5,000 IU.  2. Insulin resistance [name] has a diagnosis of insulin resistance based on her elevated fasting insulin level >5. She denies polyphagia and she is not on metformin. She continues to work on diet and exercise to decrease her risk of diabetes.  Lab Results  Component Value Date   INSULIN 6.0 05/27/2020   INSULIN 6.5 12/22/2019   INSULIN 3.6 10/16/2018   Lab Results  Component Value Date   HGBA1C 4.7 (L) 12/22/2019   Assessment/Plan:   1. Vitamin D deficiency Low Vitamin D level contributes to fatigue and are associated with obesity, breast, and colon cancer. We will check labs today. [name] will follow-up for routine testing of Vitamin D, at least 2-3 times per year to avoid over-replacement.  - VITAMIN D 25 Hydroxy (Vit-D Deficiency, Fractures)  2. Insulin resistance [name] will continue to work on weight loss, exercise, and decreasing simple  carbohydrates to help decrease the risk of diabetes. We will check labs today. [name] agreed to follow-up with [place] as directed to closely monitor her progress.  - Insulin, random  3. Class 1 obesity with serious comorbidity and body mass index (BMI) of 30.0 to 30.9 in adult, unspecified obesity type [name] is currently in the action stage of change. As such, her goal is to continue with weight loss efforts. She has agreed to following a lower carbohydrate, vegetable and lean protein rich diet plan.   Exercise goals: As is.  Behavioral modification strategies: increasing lean protein intake, decreasing simple carbohydrates and travel eating strategies.  [name] has agreed to follow-up with our clinic in 4 weeks. She was informed of the importance of frequent follow-up visits to maximize her success with intensive lifestyle modifications for her multiple health conditions.   [name] was informed we would discuss her lab results at her next visit unless there is a critical issue that needs to be addressed sooner. [name] agreed to keep her next visit at the agreed upon time to discuss these results.  Objective:   Blood pressure 93/61, pulse 68, temperature 97.9 F (36.6 C), temperature source Oral, height 5\' 4"  (1.626 m), weight 177 lb (80.3 kg), last menstrual period 05/13/2020, SpO2 97 %. Body mass index is 30.38 kg/m.  General: Cooperative, alert, well developed, in no acute distress. HEENT: Conjunctivae and lids unremarkable. Cardiovascular: Regular rhythm.  Lungs: Normal work of breathing. Neurologic: No focal deficits.   Lab Results  Component Value Date   CREATININE 0.73 12/22/2019   BUN 14 12/22/2019   NA 143 12/22/2019   K 4.9 12/22/2019   CL 106 12/22/2019   CO2 23 12/22/2019   Lab Results  Component Value Date   ALT 11 12/22/2019   AST 13 12/22/2019   ALKPHOS 54 12/22/2019   BILITOT 0.3 12/22/2019   Lab Results  Component Value Date   HGBA1C 4.7 (L) 12/22/2019     HGBA1C 5.0 10/16/2018   Lab Results  Component Value Date   INSULIN 6.0 05/27/2020   INSULIN 6.5 12/22/2019   INSULIN 3.6 10/16/2018   Lab Results  Component Value Date   TSH 3.890 12/22/2019   Lab Results  Component Value Date   CHOL 158 12/22/2019   HDL 51 12/22/2019   LDLCALC 96 12/22/2019   TRIG 54 12/22/2019   Lab Results  Component Value Date   WBC 6.4 12/22/2019   HGB 13.6 12/22/2019   HCT 40.2 12/22/2019   MCV 94 12/22/2019   PLT 242 12/22/2019   No results found for: IRON, TIBC, FERRITIN  Attestation Statements:   Reviewed by clinician on day of visit: allergies, medications, problem list, medical history, surgical history, family history, social history, and previous encounter notes.   Wilhemena Durie, am acting as Location manager for [employer], FNP-C.  I have reviewed the above documentation for accuracy and completeness, and I agree with the above. -  Georgianne Fick, FNP

## 2020-06-24 ENCOUNTER — Ambulatory Visit (INDEPENDENT_AMBULATORY_CARE_PROVIDER_SITE_OTHER): Payer: 59 | Admitting: Family Medicine

## 2020-06-28 ENCOUNTER — Other Ambulatory Visit (INDEPENDENT_AMBULATORY_CARE_PROVIDER_SITE_OTHER): Payer: Self-pay | Admitting: Family Medicine

## 2020-06-28 DIAGNOSIS — F3289 Other specified depressive episodes: Secondary | ICD-10-CM

## 2020-07-01 ENCOUNTER — Ambulatory Visit (INDEPENDENT_AMBULATORY_CARE_PROVIDER_SITE_OTHER): Payer: 59 | Admitting: Physician Assistant

## 2020-07-01 ENCOUNTER — Encounter (INDEPENDENT_AMBULATORY_CARE_PROVIDER_SITE_OTHER): Payer: Self-pay | Admitting: Physician Assistant

## 2020-07-01 ENCOUNTER — Other Ambulatory Visit: Payer: Self-pay

## 2020-07-01 VITALS — BP 97/63 | HR 74 | Temp 98.1°F | Ht 64.0 in | Wt 185.0 lb

## 2020-07-01 DIAGNOSIS — E669 Obesity, unspecified: Secondary | ICD-10-CM | POA: Diagnosis not present

## 2020-07-01 DIAGNOSIS — E559 Vitamin D deficiency, unspecified: Secondary | ICD-10-CM | POA: Diagnosis not present

## 2020-07-01 DIAGNOSIS — Z9189 Other specified personal risk factors, not elsewhere classified: Secondary | ICD-10-CM

## 2020-07-01 DIAGNOSIS — Z6831 Body mass index (BMI) 31.0-31.9, adult: Secondary | ICD-10-CM

## 2020-07-01 DIAGNOSIS — F3289 Other specified depressive episodes: Secondary | ICD-10-CM

## 2020-07-01 MED ORDER — BUPROPION HCL ER (SR) 150 MG PO TB12: 150.0000 mg | ORAL_TABLET | Freq: Two times a day (BID) | ORAL | 0 refills | Status: DC

## 2020-07-01 MED FILL — BUPROPION HCL SR 150 MG TAB: 150 | 30 days supply | Qty: 60 | Fill #0

## 2020-07-01 NOTE — Progress Notes (Signed)
Chief Complaint:   OBESITY [name] is here to discuss her progress with her obesity treatment plan along with follow-up of her obesity related diagnoses. [name] is following a lower carbohydrate, vegetable and lean protein rich diet plan and states she is following her eating plan approximately 0% of the time. [name] states she is walking 60 minutes 3-4 times per week.  Today's visit was #: 40 Starting weight: 191 lbs Starting date: 10/16/2018 Today's weight: 185 lbs Today's date: 07/01/2020 Total lbs lost to date: 6 Total lbs lost since last in-office visit: 0  Interim History: [name] reports that between her move, summer vacation, and pool nights she has not been on the plan at all. She is feeling much more tired and unmotivated to workout. She is asking about which plan to change to (if needed), as she is going to the beach next week. Sugar cravings have greatly increased.  Subjective:   Other depression, with emotional eating. [name] is struggling with emotional eating and using food for comfort to the extent that it is negatively impacting her health. She has been working on behavior modification techniques to help reduce her emotional eating and has been somewhat successful. She shows no sign of suicidal or homicidal ideations. [name] states that she has not been taking the second dose of bupropion, as she forgets to take it. She reports increased cravings.  Vitamin D deficiency. [name] is on Vitamin D supplementation. No nausea, vomiting, or muscle weakness.    Ref. Range 05/27/2020 14:25  Vitamin D, 25-Hydroxy Latest Ref Range: 30.0 - 100.0 ng/mL 55.4   At risk for osteoporosis. [name] is at higher risk of osteopenia and osteoporosis due to Vitamin D deficiency.   Assessment/Plan:   Other depression, with emotional eating. Behavior modification techniques were discussed today to help [name] deal with her emotional/non-hunger eating behaviors.  Orders and  follow up as documented in patient record. Refill was given for buPROPion (WELLBUTRIN SR) 150 MG 12 hr tablet #60 with 0 refills.  Vitamin D deficiency. Low Vitamin D level contributes to fatigue and are associated with obesity, breast, and colon cancer. She agrees to continue to take Vitamin D as directed and will follow-up for routine testing of Vitamin D, at least 2-3 times per year to avoid over-replacement.  At risk for osteoporosis. [name] was given approximately 15 minutes of osteoporosis prevention counseling today. [name] is at risk for osteopenia and osteoporosis due to her Vitamin D deficiency. She was encouraged to take her Vitamin D and follow her higher calcium diet and increase strengthening exercise to help strengthen her bones and decrease her risk of osteopenia and osteoporosis.  Repetitive spaced learning was employed today to elicit superior memory formation and behavioral change.  Class 1 obesity with serious comorbidity and body mass index (BMI) of 31.0 to 31.9 in adult, unspecified obesity type.  [name] is currently in the action stage of change. As such, her goal is to continue with weight loss efforts. She has agreed to change plans and will now follow the Category 3 Plan.   Exercise goals: For substantial health benefits, adults should do at least 150 minutes (2 hours and 30 minutes) a week of moderate-intensity, or 75 minutes (1 hour and 15 minutes) a week of vigorous-intensity aerobic physical activity, or an equivalent combination of moderate- and vigorous-intensity aerobic activity. Aerobic activity should be performed in episodes of at least 10 minutes, and preferably, it should be spread throughout the week.  Behavioral modification  strategies: meal planning and cooking strategies and keeping healthy foods in the home.  [name] has agreed to follow-up with our clinic in 3 weeks. She was informed of the importance of frequent follow-up visits to maximize her success  with intensive lifestyle modifications for her multiple health conditions.   Objective:   Blood pressure 97/63, pulse 74, temperature 98.1 F (36.7 C), temperature source Oral, height 5\' 4"  (1.626 m), weight 185 lb (83.9 kg), last menstrual period 06/21/2020, SpO2 (!) 8 %. Body mass index is 31.76 kg/m.  General: Cooperative, alert, well developed, in no acute distress. HEENT: Conjunctivae and lids unremarkable. Cardiovascular: Regular rhythm.  Lungs: Normal work of breathing. Neurologic: No focal deficits.   Lab Results  Component Value Date   CREATININE 0.73 12/22/2019   BUN 14 12/22/2019   NA 143 12/22/2019   K 4.9 12/22/2019   CL 106 12/22/2019   CO2 23 12/22/2019   Lab Results  Component Value Date   ALT 11 12/22/2019   AST 13 12/22/2019   ALKPHOS 54 12/22/2019   BILITOT 0.3 12/22/2019   Lab Results  Component Value Date   HGBA1C 4.7 (L) 12/22/2019   HGBA1C 5.0 10/16/2018   Lab Results  Component Value Date   INSULIN 6.0 05/27/2020   INSULIN 6.5 12/22/2019   INSULIN 3.6 10/16/2018   Lab Results  Component Value Date   TSH 3.890 12/22/2019   Lab Results  Component Value Date   CHOL 158 12/22/2019   HDL 51 12/22/2019   LDLCALC 96 12/22/2019   TRIG 54 12/22/2019   Lab Results  Component Value Date   WBC 6.4 12/22/2019   HGB 13.6 12/22/2019   HCT 40.2 12/22/2019   MCV 94 12/22/2019   PLT 242 12/22/2019   No results found for: IRON, TIBC, FERRITIN  Attestation Statements:   Reviewed by clinician on day of visit: allergies, medications, problem list, medical history, surgical history, family history, social history, and previous encounter notes.  [name], am acting as transcriptionist for Sheila Potash, PA-C   I have reviewed the above documentation for accuracy and completeness, and I agree with the above. Sheila Potash, PA-C

## 2020-07-09 DIAGNOSIS — Z20822 Contact with and (suspected) exposure to covid-19: Secondary | ICD-10-CM | POA: Diagnosis not present

## 2020-07-09 DIAGNOSIS — Z03818 Encounter for observation for suspected exposure to other biological agents ruled out: Secondary | ICD-10-CM | POA: Diagnosis not present

## 2020-07-27 ENCOUNTER — Ambulatory Visit (INDEPENDENT_AMBULATORY_CARE_PROVIDER_SITE_OTHER): Payer: 59 | Admitting: Family Medicine

## 2020-07-27 ENCOUNTER — Other Ambulatory Visit: Payer: Self-pay

## 2020-07-27 ENCOUNTER — Encounter (INDEPENDENT_AMBULATORY_CARE_PROVIDER_SITE_OTHER): Payer: Self-pay | Admitting: Family Medicine

## 2020-07-27 VITALS — BP 86/57 | HR 79 | Temp 97.8°F | Ht 64.0 in | Wt 188.0 lb

## 2020-07-27 DIAGNOSIS — F3289 Other specified depressive episodes: Secondary | ICD-10-CM | POA: Diagnosis not present

## 2020-07-27 DIAGNOSIS — E559 Vitamin D deficiency, unspecified: Secondary | ICD-10-CM

## 2020-07-27 DIAGNOSIS — Z6832 Body mass index (BMI) 32.0-32.9, adult: Secondary | ICD-10-CM

## 2020-07-27 DIAGNOSIS — E669 Obesity, unspecified: Secondary | ICD-10-CM

## 2020-07-27 MED ORDER — BUPROPION HCL ER (SR) 150 MG PO TB12: 150.0000 mg | ORAL_TABLET | Freq: Two times a day (BID) | ORAL | 0 refills | Status: DC

## 2020-07-27 MED FILL — BUPROPION HCL SR 150 MG TAB: 150 | 30 days supply | Qty: 60 | Fill #0

## 2020-07-27 NOTE — Progress Notes (Signed)
Chief Complaint:   OBESITY [name] is here to discuss her progress with her obesity treatment plan along with follow-up of her obesity related diagnoses. [name] is on the Category 3 Plan and states she is following her eating plan approximately 70% of the time. [name] states she is walking and doing strength training for 45-60 minutes 5 times per week.  Today's visit was #: 68 Starting weight: 191 lbs Starting date: 10/16/2018 Today's weight: 188 lbs Today's date: 07/27/2020 Total lbs lost to date: 3 Total lbs lost since last in-office visit: 0  Interim History: [name] was switched to the Category 3 plan at her last office visit. She feels she has stuck to it fairly well. She notes being in a "funk" due to several stressors (mostly COVID) and even her exercise has been "off" which has always been very consistent. She feels she is more successful with the Low carbohydrate plan overall.  Subjective:   1. Other depression, with emotional eating [name] notes feeling a bit down over the past 4 weeks over stressors such as COVID, the end of summer, etc. She is forgetting her second dose of bupropion. She notes increased stress eating.  2. Vitamin D deficiency [name]'s last Vit D level was at goal. She is on 5,000 IU daily of Vit D.  Assessment/Plan:   1. Other depression, with emotional eating  We will refill bupropion for 1 month. I recommended counseling but she feels she has the tools she needs, just needs to use them.  She will set alarm to remind her to take second dose of bupropion.  - buPROPion (WELLBUTRIN SR) 150 MG 12 hr tablet; Take 1 tablet (150 mg total) by mouth 2 (two) times daily.  Dispense: 60 tablet; Refill: 0  2. Vitamin D deficiency [name] agreed to continue taking Vit D 5,000 IU daily and will follow-up for routine testing of Vitamin D, at least 2-3 times per year to avoid over-replacement.  3. Class 1 obesity with serious comorbidity and body mass index  (BMI) of 32.0 to 32.9 in adult, unspecified obesity type [name] is currently in the action stage of change. As such, her goal is to continue with weight loss efforts. She has agreed to switch back to following a lower carbohydrate, vegetable and lean protein rich diet plan, plus 1 fruit and 1 "2 Good" yogurt daily.   [name] will get back to her normal workout routine.   Exercise goals: As is.  Behavioral modification strategies: increasing lean protein intake and decreasing simple carbohydrates.  [name] has agreed to follow-up with our clinic in 3 to 4 weeks.   Objective:   Blood pressure (!) 86/57, pulse 79, temperature 97.8 F (36.6 C), height 5\' 4"  (1.626 m), weight 188 lb (85.3 kg), SpO2 98 %. Body mass index is 32.27 kg/m.  General: Cooperative, alert, well developed, in no acute distress. HEENT: Conjunctivae and lids unremarkable. Cardiovascular: Regular rhythm.  Lungs: Normal work of breathing. Neurologic: No focal deficits.   Lab Results  Component Value Date   CREATININE 0.73 12/22/2019   BUN 14 12/22/2019   NA 143 12/22/2019   K 4.9 12/22/2019   CL 106 12/22/2019   CO2 23 12/22/2019   Lab Results  Component Value Date   ALT 11 12/22/2019   AST 13 12/22/2019   ALKPHOS 54 12/22/2019   BILITOT 0.3 12/22/2019   Lab Results  Component Value Date   HGBA1C 4.7 (L) 12/22/2019   HGBA1C 5.0 10/16/2018   Lab  Results  Component Value Date   INSULIN 6.0 05/27/2020   INSULIN 6.5 12/22/2019   INSULIN 3.6 10/16/2018   Lab Results  Component Value Date   TSH 3.890 12/22/2019   Lab Results  Component Value Date   CHOL 158 12/22/2019   HDL 51 12/22/2019   LDLCALC 96 12/22/2019   TRIG 54 12/22/2019   Lab Results  Component Value Date   WBC 6.4 12/22/2019   HGB 13.6 12/22/2019   HCT 40.2 12/22/2019   MCV 94 12/22/2019   PLT 242 12/22/2019   No results found for: IRON, TIBC, FERRITIN  Attestation Statements:   Reviewed by clinician on day of visit:  allergies, medications, problem list, medical history, surgical history, family history, social history, and previous encounter notes.   Wilhemena Durie, am acting as transcriptionist for [employer], FNP-C  I have reviewed the above documentation for accuracy and completeness, and I agree with the above. -  Georgianne Fick, FNP

## 2020-08-04 ENCOUNTER — Encounter (INDEPENDENT_AMBULATORY_CARE_PROVIDER_SITE_OTHER): Payer: Self-pay | Admitting: Family Medicine

## 2020-08-04 ENCOUNTER — Telehealth: Payer: 59 | Admitting: Physician Assistant

## 2020-08-04 DIAGNOSIS — N390 Urinary tract infection, site not specified: Secondary | ICD-10-CM | POA: Diagnosis not present

## 2020-08-04 MED ORDER — CEPHALEXIN 500 MG PO CAPS: 500.0000 mg | ORAL_CAPSULE | Freq: Two times a day (BID) | ORAL | 0 refills | Status: AC

## 2020-08-04 NOTE — Progress Notes (Signed)

## 2020-08-16 NOTE — Progress Notes (Signed)
5 minutes spent on chart

## 2020-08-19 ENCOUNTER — Ambulatory Visit (INDEPENDENT_AMBULATORY_CARE_PROVIDER_SITE_OTHER): Payer: 59 | Admitting: Family Medicine

## 2020-08-26 ENCOUNTER — Encounter (INDEPENDENT_AMBULATORY_CARE_PROVIDER_SITE_OTHER): Payer: Self-pay | Admitting: Family Medicine

## 2020-08-26 ENCOUNTER — Ambulatory Visit (INDEPENDENT_AMBULATORY_CARE_PROVIDER_SITE_OTHER): Payer: 59 | Admitting: Family Medicine

## 2020-08-26 ENCOUNTER — Other Ambulatory Visit: Payer: Self-pay

## 2020-08-26 ENCOUNTER — Other Ambulatory Visit (INDEPENDENT_AMBULATORY_CARE_PROVIDER_SITE_OTHER): Payer: Self-pay | Admitting: Family Medicine

## 2020-08-26 VITALS — BP 105/67 | HR 75 | Temp 97.9°F | Ht 64.0 in | Wt 191.0 lb

## 2020-08-26 DIAGNOSIS — Z9189 Other specified personal risk factors, not elsewhere classified: Secondary | ICD-10-CM | POA: Diagnosis not present

## 2020-08-26 DIAGNOSIS — E88819 Insulin resistance, unspecified: Secondary | ICD-10-CM

## 2020-08-26 DIAGNOSIS — E8881 Metabolic syndrome: Secondary | ICD-10-CM | POA: Diagnosis not present

## 2020-08-26 DIAGNOSIS — E669 Obesity, unspecified: Secondary | ICD-10-CM

## 2020-08-26 DIAGNOSIS — Z6832 Body mass index (BMI) 32.0-32.9, adult: Secondary | ICD-10-CM | POA: Diagnosis not present

## 2020-08-26 DIAGNOSIS — F3289 Other specified depressive episodes: Secondary | ICD-10-CM | POA: Diagnosis not present

## 2020-08-26 MED ORDER — WEGOVY 0.25 MG/0.5ML ~~LOC~~ SOAJ: 0.2500 mg | SUBCUTANEOUS | 0 refills | Status: DC

## 2020-08-26 NOTE — Progress Notes (Signed)
Chief Complaint:   OBESITY [name] is here to discuss her progress with her obesity treatment plan along with follow-up of her obesity related diagnoses. [name] is on following a lower carbohydrate, vegetable and lean protein rich diet plan and states she is following her eating plan approximately 50% of the time. [name] states she is walking and doing strengthening for 30-45 minutes 5 times per week.  Today's visit was #: 83 Starting weight: 191 lbs Starting date: 10/16/2018 Today's weight: 191 lbs Today's date: 08/26/2020 Total lbs lost to date: 0 Total lbs lost since last in-office visit: 0  Interim History: Adalyne's mood is better overall but she notes she has not focused on following the eating plan. She is on the plan at breakfast but drifts off the plan later in the day. She is back to exercising regularly.  Subjective:   1. Insulin resistance [name] has a diagnosis of insulin resistance based on her elevated fasting insulin level >5. She is not on metformin, and she notes some polyphagia. .  Lab Results  Component Value Date   INSULIN 6.0 05/27/2020   INSULIN 6.5 12/22/2019   INSULIN 3.6 10/16/2018   Lab Results  Component Value Date   HGBA1C 4.7 (L) 12/22/2019   2. Other depression, with emotional eating [name] notes she feels her depression is situational but is improving. She is on bupropion 150 mg BID.  3. At risk for side effect of medication [name] is at risk for drug side effects due to start of Wegovy (possible constipation and nausea).  Assessment/Plan:   1. Insulin resistance  [name] agreed start [phone].  2. Other depression, with emotional eating  [name] will continue bupropion.   3. At risk for side effect of medication [name] was given approximately 15 minutes of drug side effect counseling today due to start of Wegovy.  We discussed side effect possibility and risk versus benefits. [name] agreed to the medication and will contact  this office if these side effects are intolerable.   4. Class 1 obesity with serious comorbidity and body mass index (BMI) of 32.0 to 32.9 in adult, unspecified obesity type [name] is currently in the action stage of change. As such, her goal is to continue with weight loss efforts. She has agreed to following a lower carbohydrate, vegetable and lean protein rich diet plan.   We discussed various medication options to help [name] with her weight loss efforts and we both agreed to start Wegovy 0.25 mg SubQ weekly, with no refills. [name] denies personal or family history of thyroid cancer, history of pancreatitis, or current cholelithiasis.  - Semaglutide-Weight Management (WEGOVY) 0.25 MG/0.5ML SOAJ; Inject 0.25 mg into the skin once a week.  Dispense: 2 mL; Refill: 0  Handout given today: Recipes.  Exercise goals: As is.  Behavioral modification strategies: decreasing simple carbohydrates.  [name] has agreed to follow-up with our clinic in 3 to 4 weeks.  Objective:   Blood pressure 105/67, pulse 75, temperature 97.9 F (36.6 C), temperature source Oral, height 5\' 4"  (1.626 m), weight 191 lb (86.6 kg), SpO2 97 %. Body mass index is 32.79 kg/m.  General: Cooperative, alert, well developed, in no acute distress. HEENT: Conjunctivae and lids unremarkable. Cardiovascular: Regular rhythm.  Lungs: Normal work of breathing. Neurologic: No focal deficits.   Lab Results  Component Value Date   CREATININE 0.73 12/22/2019   BUN 14 12/22/2019   NA 143 12/22/2019   K 4.9 12/22/2019   CL 106 12/22/2019  CO2 23 12/22/2019   Lab Results  Component Value Date   ALT 11 12/22/2019   AST 13 12/22/2019   ALKPHOS 54 12/22/2019   BILITOT 0.3 12/22/2019   Lab Results  Component Value Date   HGBA1C 4.7 (L) 12/22/2019   HGBA1C 5.0 10/16/2018   Lab Results  Component Value Date   INSULIN 6.0 05/27/2020   INSULIN 6.5 12/22/2019   INSULIN 3.6 10/16/2018   Lab Results  Component  Value Date   TSH 3.890 12/22/2019   Lab Results  Component Value Date   CHOL 158 12/22/2019   HDL 51 12/22/2019   LDLCALC 96 12/22/2019   TRIG 54 12/22/2019   Lab Results  Component Value Date   WBC 6.4 12/22/2019   HGB 13.6 12/22/2019   HCT 40.2 12/22/2019   MCV 94 12/22/2019   PLT 242 12/22/2019   No results found for: IRON, TIBC, FERRITIN  Attestation Statements:   Reviewed by clinician on day of visit: allergies, medications, problem list, medical history, surgical history, family history, social history, and previous encounter notes.   [name], am acting as Location manager for [employer], FNP-C.  I have reviewed the above documentation for accuracy and completeness, and I agree with the above. -  Georgianne Fick, FNP

## 2020-08-27 ENCOUNTER — Encounter (INDEPENDENT_AMBULATORY_CARE_PROVIDER_SITE_OTHER): Payer: Self-pay | Admitting: Family Medicine

## 2020-09-03 ENCOUNTER — Other Ambulatory Visit (HOSPITAL_COMMUNITY): Payer: Self-pay | Admitting: Dermatology

## 2020-09-03 DIAGNOSIS — D2371 Other benign neoplasm of skin of right lower limb, including hip: Secondary | ICD-10-CM | POA: Diagnosis not present

## 2020-09-03 DIAGNOSIS — L821 Other seborrheic keratosis: Secondary | ICD-10-CM | POA: Diagnosis not present

## 2020-09-03 DIAGNOSIS — D2261 Melanocytic nevi of right upper limb, including shoulder: Secondary | ICD-10-CM | POA: Diagnosis not present

## 2020-09-03 DIAGNOSIS — D2272 Melanocytic nevi of left lower limb, including hip: Secondary | ICD-10-CM | POA: Diagnosis not present

## 2020-09-03 DIAGNOSIS — B0089 Other herpesviral infection: Secondary | ICD-10-CM | POA: Diagnosis not present

## 2020-09-03 DIAGNOSIS — D1801 Hemangioma of skin and subcutaneous tissue: Secondary | ICD-10-CM | POA: Diagnosis not present

## 2020-09-03 DIAGNOSIS — D225 Melanocytic nevi of trunk: Secondary | ICD-10-CM | POA: Diagnosis not present

## 2020-09-03 MED FILL — valACYclovir HCL 1 GM TABS: 1 | 5 days supply | Qty: 20 | Fill #0

## 2020-09-14 ENCOUNTER — Encounter (INDEPENDENT_AMBULATORY_CARE_PROVIDER_SITE_OTHER): Payer: Self-pay | Admitting: Family Medicine

## 2020-09-14 NOTE — Telephone Encounter (Signed)
Appeal letter faxed to insurance.

## 2020-09-15 ENCOUNTER — Other Ambulatory Visit (INDEPENDENT_AMBULATORY_CARE_PROVIDER_SITE_OTHER): Payer: Self-pay | Admitting: Family Medicine

## 2020-09-15 ENCOUNTER — Other Ambulatory Visit: Payer: Self-pay

## 2020-09-15 ENCOUNTER — Ambulatory Visit (INDEPENDENT_AMBULATORY_CARE_PROVIDER_SITE_OTHER): Payer: 59 | Admitting: Family Medicine

## 2020-09-15 ENCOUNTER — Encounter (INDEPENDENT_AMBULATORY_CARE_PROVIDER_SITE_OTHER): Payer: Self-pay | Admitting: Family Medicine

## 2020-09-15 VITALS — BP 94/65 | HR 73 | Temp 97.9°F | Ht 64.0 in | Wt 193.0 lb

## 2020-09-15 DIAGNOSIS — E8881 Metabolic syndrome: Secondary | ICD-10-CM | POA: Diagnosis not present

## 2020-09-15 DIAGNOSIS — Z6833 Body mass index (BMI) 33.0-33.9, adult: Secondary | ICD-10-CM | POA: Diagnosis not present

## 2020-09-15 DIAGNOSIS — E669 Obesity, unspecified: Secondary | ICD-10-CM | POA: Diagnosis not present

## 2020-09-15 DIAGNOSIS — E88819 Insulin resistance, unspecified: Secondary | ICD-10-CM

## 2020-09-15 DIAGNOSIS — F3289 Other specified depressive episodes: Secondary | ICD-10-CM | POA: Diagnosis not present

## 2020-09-15 MED ORDER — BUPROPION HCL ER (SR) 150 MG PO TB12: 150.0000 mg | ORAL_TABLET | Freq: Two times a day (BID) | ORAL | 0 refills | Status: DC

## 2020-09-15 MED FILL — WEGOVY 0.25 MG/0.5ML SOAJ: 0.25 | 30 days supply | Qty: 2 | Fill #0

## 2020-09-15 MED FILL — BUPROPION HCL SR 150 MG TAB: 150 | 30 days supply | Qty: 60 | Fill #0

## 2020-09-16 ENCOUNTER — Encounter (INDEPENDENT_AMBULATORY_CARE_PROVIDER_SITE_OTHER): Payer: Self-pay | Admitting: Family Medicine

## 2020-09-16 NOTE — Progress Notes (Signed)
Chief Complaint:   OBESITY [name] is here to discuss her progress with her obesity treatment plan along with follow-up of her obesity related diagnoses. [name] is following a lower carbohydrate, vegetable and lean protein rich diet plan and states she is following her eating plan approximately 75% of the time. [name] states she is doing stretching/training/cardio 30-45 minutes 5 times per week.  Today's visit was #: 6 Starting weight: 191 lbs Starting date: 10/16/2018 Today's weight: 193 lbs Today's date: 09/15/2020 Total lbs lost to date: 0 Total lbs lost since last in-office visit: 0  Interim History: Wegovy coverage was denied by insurance and we have appealed. [name] is up 2 lbs today. She feels she has done fairly well on her plan, but feels she needs to "fine tune" her eating.  Subjective:   Other depression, with emotional eating.  [name] is on bupropion and reports cravings are well controlled.  Insulin resistance. [name] has a diagnosis of mild insulin resistance based on her elevated fasting insulin level >5. Marland Kitchen She reports mild polyphagia.  Lab Results  Component Value Date   INSULIN 6.0 05/27/2020   INSULIN 6.5 12/22/2019   INSULIN 3.6 10/16/2018   Lab Results  Component Value Date   HGBA1C 4.7 (L) 12/22/2019   Assessment/Plan:   Other depression, with emotional eating.  Refill was given for buPROPion (WELLBUTRIN SR) 150 MG 12 hr tablet BID #60 with 0 refills.  Insulin resistance.   She will continue the meal plan as directed.   Class 1 obesity with body mass index (BMI) of 33.0 to 33.9 in adult, unspecified obesity type, unspecified whether serious comorbidity present.  [name] is currently in the action stage of change. As such, her goal is to continue with weight loss efforts. She has agreed to following a lower carbohydrate, vegetable and lean protein rich diet plan.  Hopefully we can get the [facility] approved. I feel this will help her  with plan adherence.   Exercise goals: [name] will continue her current exercise regimen.   Behavioral modification strategies: decreasing simple carbohydrates.  [name] has agreed to follow-up with our clinic in 3-4 weeks. She was informed of the importance of frequent follow-up visits to maximize her success with intensive lifestyle modifications for her multiple health conditions.   Objective:   Blood pressure 94/65, pulse 73, temperature 97.9 F (36.6 C), height 5\' 4"  (1.626 m), weight 193 lb (87.5 kg), SpO2 98 %. Body mass index is 33.13 kg/m.  General: Cooperative, alert, well developed, in no acute distress. HEENT: Conjunctivae and lids unremarkable. Cardiovascular: Regular rhythm.  Lungs: Normal work of breathing. Neurologic: No focal deficits.   Lab Results  Component Value Date   CREATININE 0.73 12/22/2019   BUN 14 12/22/2019   NA 143 12/22/2019   K 4.9 12/22/2019   CL 106 12/22/2019   CO2 23 12/22/2019   Lab Results  Component Value Date   ALT 11 12/22/2019   AST 13 12/22/2019   ALKPHOS 54 12/22/2019   BILITOT 0.3 12/22/2019   Lab Results  Component Value Date   HGBA1C 4.7 (L) 12/22/2019   HGBA1C 5.0 10/16/2018   Lab Results  Component Value Date   INSULIN 6.0 05/27/2020   INSULIN 6.5 12/22/2019   INSULIN 3.6 10/16/2018   Lab Results  Component Value Date   TSH 3.890 12/22/2019   Lab Results  Component Value Date   CHOL 158 12/22/2019   HDL 51 12/22/2019   LDLCALC 96 12/22/2019  TRIG 54 12/22/2019   Lab Results  Component Value Date   WBC 6.4 12/22/2019   HGB 13.6 12/22/2019   HCT 40.2 12/22/2019   MCV 94 12/22/2019   PLT 242 12/22/2019   No results found for: IRON, TIBC, FERRITIN  Attestation Statements:   Reviewed by clinician on day of visit: allergies, medications, problem list, medical history, surgical history, family history, social history, and previous encounter notes.  IMichaelene Song, am acting as Location manager for  [employer], FNP-C   I have reviewed the above documentation for accuracy and completeness, and I agree with the above. -  Georgianne Fick, FNP

## 2020-09-20 ENCOUNTER — Encounter (INDEPENDENT_AMBULATORY_CARE_PROVIDER_SITE_OTHER): Payer: Self-pay

## 2020-09-20 DIAGNOSIS — Z01419 Encounter for gynecological examination (general) (routine) without abnormal findings: Secondary | ICD-10-CM | POA: Diagnosis not present

## 2020-09-20 DIAGNOSIS — Z124 Encounter for screening for malignant neoplasm of cervix: Secondary | ICD-10-CM | POA: Diagnosis not present

## 2020-09-23 DIAGNOSIS — Z13220 Encounter for screening for lipoid disorders: Secondary | ICD-10-CM | POA: Diagnosis not present

## 2020-09-23 DIAGNOSIS — E559 Vitamin D deficiency, unspecified: Secondary | ICD-10-CM | POA: Diagnosis not present

## 2020-09-23 DIAGNOSIS — Z0000 Encounter for general adult medical examination without abnormal findings: Secondary | ICD-10-CM | POA: Diagnosis not present

## 2020-10-04 ENCOUNTER — Ambulatory Visit (INDEPENDENT_AMBULATORY_CARE_PROVIDER_SITE_OTHER): Payer: 59 | Admitting: Family Medicine

## 2020-10-04 ENCOUNTER — Encounter (INDEPENDENT_AMBULATORY_CARE_PROVIDER_SITE_OTHER): Payer: Self-pay | Admitting: Family Medicine

## 2020-10-04 ENCOUNTER — Other Ambulatory Visit (INDEPENDENT_AMBULATORY_CARE_PROVIDER_SITE_OTHER): Payer: Self-pay | Admitting: Family Medicine

## 2020-10-04 ENCOUNTER — Other Ambulatory Visit: Payer: Self-pay

## 2020-10-04 VITALS — BP 107/67 | HR 70 | Temp 98.3°F | Ht 64.0 in | Wt 189.0 lb

## 2020-10-04 DIAGNOSIS — E669 Obesity, unspecified: Secondary | ICD-10-CM

## 2020-10-04 DIAGNOSIS — F3289 Other specified depressive episodes: Secondary | ICD-10-CM | POA: Diagnosis not present

## 2020-10-04 DIAGNOSIS — Z6832 Body mass index (BMI) 32.0-32.9, adult: Secondary | ICD-10-CM

## 2020-10-04 DIAGNOSIS — R0600 Dyspnea, unspecified: Secondary | ICD-10-CM

## 2020-10-04 DIAGNOSIS — Z9189 Other specified personal risk factors, not elsewhere classified: Secondary | ICD-10-CM | POA: Diagnosis not present

## 2020-10-04 DIAGNOSIS — R0609 Other forms of dyspnea: Secondary | ICD-10-CM

## 2020-10-04 MED ORDER — WEGOVY 0.5 MG/0.5ML ~~LOC~~ SOAJ: 0.5000 mg | SUBCUTANEOUS | 0 refills | Status: DC

## 2020-10-04 MED FILL — WEGOVY 0.5 MG/0.5ML SOAJ: 0.5 | 28 days supply | Qty: 2 | Fill #0

## 2020-10-05 NOTE — Progress Notes (Signed)
Chief Complaint:   OBESITY [name] is here to discuss her progress with her obesity treatment plan along with follow-up of her obesity related diagnoses. [name] is on following a lower carbohydrate, vegetable and lean protein rich diet plan and states she is following her eating plan approximately 75-80% of the time. [name] states she is doing strengthening and cardio for 45 minutes 5-6 times per week.  Today's visit was #: 76 Starting weight: 191 lbs Starting date: 10/16/2018 Today's weight: 189 lbs Today's date: 10/04/2020 Total lbs lost to date: 2 Total lbs lost since last in-office visit: 4  Interim History: [name] is currently on Wegovy 0.25 mg weekly. She feels it is increasing satiety and decreasing hunger. She notes mild indigestion, but denies constipation, diarrhea, or nausea. Her fat mass is down 2 lbs, and muscle mass is up 1 lb since starting the program in Nov. 2019. Her IC today shows a decrease in RMR from [place] on 10/16/2018 to 1593 on 10/04/2020.  Subjective:   1. DOE (dyspnea on exertion) [name] notes shortness of breath with exertion.   2. Other depression, with emotional eating [name]'s mood is stable and her cravings are well controlled.  3. At risk for side effect of medication [name] is at risk for drug side effects due to increased dose of Wegovy.  Assessment/Plan:   1. DOE (dyspnea on exertion) IC was done today and decrease in RMR noted.  2. Other depression, with emotional eating [name] will continue bupropion.    3. At risk for side effect of medication [name] was given approximately 15 minutes of drug side effect counseling today.  We discussed side effect possibility (constipation, nausea due to increased dose of Wegovy) and risk versus benefits. [name] agreed to the medication and will contact this office if these side effects are intolerable.  Repetitive spaced learning was employed today to elicit superior memory formation and  behavioral change.  4. Class 1 obesity with serious comorbidity and body mass index (BMI) of 32.0 to 32.9 in adult, unspecified obesity type [name] is currently in the action stage of change. As such, her goal is to continue with weight loss efforts. She has agreed to the Category 2 Plan or keeping a food journal and adhering to recommended goals of 1200 calories and 80 grams of protein daily.   We discussed IC results today, and she will try to keep track of calories.  We discussed various medication options to help [name] with her weight loss efforts and we both agreed to increase dose of Wegovy to 0.50 mg SubQ weekly with no refills.  - Semaglutide-Weight Management (WEGOVY) 0.5 MG/0.5ML SOAJ; Inject 0.5 mg into the skin once a week.  Dispense: 2 mL; Refill: 0  Exercise goals: As is.  Behavioral modification strategies: increasing lean protein intake.  Jenavee has agreed to follow-up with our clinic in 3 weeks.  Objective:   Blood pressure 107/67, pulse 70, temperature 98.3 F (36.8 C), height 5\' 4"  (1.626 m), weight 189 lb (85.7 kg), SpO2 98 %. Body mass index is 32.44 kg/m.  General: Cooperative, alert, well developed, in no acute distress. HEENT: Conjunctivae and lids unremarkable. Cardiovascular: Regular rhythm.  Lungs: Normal work of breathing. Neurologic: No focal deficits.   Lab Results  Component Value Date   CREATININE 0.73 12/22/2019   BUN 14 12/22/2019   NA 143 12/22/2019   K 4.9 12/22/2019   CL 106 12/22/2019   CO2 23 12/22/2019   Lab Results  Component Value  Date   ALT 11 12/22/2019   AST 13 12/22/2019   ALKPHOS 54 12/22/2019   BILITOT 0.3 12/22/2019   Lab Results  Component Value Date   HGBA1C 4.7 (L) 12/22/2019   HGBA1C 5.0 10/16/2018   Lab Results  Component Value Date   INSULIN 6.0 05/27/2020   INSULIN 6.5 12/22/2019   INSULIN 3.6 10/16/2018   Lab Results  Component Value Date   TSH 3.890 12/22/2019   Lab Results  Component Value  Date   CHOL 158 12/22/2019   HDL 51 12/22/2019   LDLCALC 96 12/22/2019   TRIG 54 12/22/2019   Lab Results  Component Value Date   WBC 6.4 12/22/2019   HGB 13.6 12/22/2019   HCT 40.2 12/22/2019   MCV 94 12/22/2019   PLT 242 12/22/2019   No results found for: IRON, TIBC, FERRITIN  Attestation Statements:   Reviewed by clinician on day of visit: allergies, medications, problem list, medical history, surgical history, family history, social history, and previous encounter notes.   Wilhemena Durie, am acting as Location manager for [employer], FNP-C.  I have reviewed the above documentation for accuracy and completeness, and I agree with the above. -  Georgianne Fick, FNP

## 2020-10-06 ENCOUNTER — Encounter (INDEPENDENT_AMBULATORY_CARE_PROVIDER_SITE_OTHER): Payer: Self-pay | Admitting: Family Medicine

## 2020-10-14 MED FILL — WEGOVY 0.5 MG/0.5ML SOAJ: 0.5 | 28 days supply | Qty: 2 | Fill #0

## 2020-10-28 ENCOUNTER — Encounter (INDEPENDENT_AMBULATORY_CARE_PROVIDER_SITE_OTHER): Payer: Self-pay | Admitting: Family Medicine

## 2020-10-28 ENCOUNTER — Other Ambulatory Visit: Payer: Self-pay

## 2020-10-28 ENCOUNTER — Ambulatory Visit (INDEPENDENT_AMBULATORY_CARE_PROVIDER_SITE_OTHER): Payer: 59 | Admitting: Family Medicine

## 2020-10-28 ENCOUNTER — Other Ambulatory Visit (INDEPENDENT_AMBULATORY_CARE_PROVIDER_SITE_OTHER): Payer: Self-pay | Admitting: Family Medicine

## 2020-10-28 VITALS — BP 111/72 | HR 80 | Temp 98.3°F | Ht 64.0 in | Wt 185.0 lb

## 2020-10-28 DIAGNOSIS — Z6831 Body mass index (BMI) 31.0-31.9, adult: Secondary | ICD-10-CM

## 2020-10-28 DIAGNOSIS — E559 Vitamin D deficiency, unspecified: Secondary | ICD-10-CM | POA: Diagnosis not present

## 2020-10-28 DIAGNOSIS — E669 Obesity, unspecified: Secondary | ICD-10-CM | POA: Diagnosis not present

## 2020-10-28 DIAGNOSIS — F3289 Other specified depressive episodes: Secondary | ICD-10-CM

## 2020-10-28 MED ORDER — WEGOVY 0.5 MG/0.5ML ~~LOC~~ SOAJ: 0.5000 mg | SUBCUTANEOUS | 0 refills | Status: DC

## 2020-10-28 MED ORDER — BUPROPION HCL ER (SR) 150 MG PO TB12: 150.0000 mg | ORAL_TABLET | Freq: Two times a day (BID) | ORAL | 0 refills | Status: DC

## 2020-10-28 MED FILL — BUPROPION HCL SR 150 MG TAB: 150 | 30 days supply | Qty: 60 | Fill #0

## 2020-10-28 NOTE — Progress Notes (Signed)
Chief Complaint:   OBESITY [name] is here to discuss her progress with her obesity treatment plan along with follow-up of her obesity related diagnoses. [name] is on the Category 2 Plan and keeping a food journal or adhering to recommended goals of 1200 calories and 80 grams of protein and states she is following her eating plan approximately 75% of the time. [name] states she is walking and strength training for 30-45 minutes 5-6 times per week.  Today's visit was #: 46 Starting weight: 191 lbs Starting date: 10/16/2018 Today's weight: 185 lbs Today's date: 10/28/2020 Total lbs lost to date: 6 lbs Total lbs lost since last in-office visit: 4  Interim History: [name] Wegovy was increased to 0.5 mg weekly at last office visit.  She notes lack of appetite and rapid satiety.  She feels she is not getting her protein in.  She has nausea that comes in waves.  She has made some poor food choices, she reports due to leftovers from Thanksgiving.    Subjective:   1. Vitamin D deficiency Vitamin D at goal at 55.4 on 05/27/2020.  She is on 5,000 IU OTC vitamin D3 for maintenance.  It was checked recently at her PCP's office but she does not know the result.  2. Other depression, with emotional eating She is taking bupropion 150 mg twice daily.  Denies cravings or appetite.  Assessment/Plan:   1. Vitamin D deficiency Continue OTC vitamin D3 5,000 IU daily.  2. Other depression, with emotional eating Will refill bupropion, as per below.  -Refill buPROPion (WELLBUTRIN SR) 150 MG 12 hr tablet; Take 1 tablet (150 mg total) by mouth 2 (two) times daily.  Dispense: 60 tablet; Refill: 0  3. Class 1 obesity with serious comorbidity and body mass index (BMI) of 31.0 to 31.9 in adult, unspecified obesity type  -Refill Semaglutide-Weight Management (WEGOVY) 0.5 MG/0.5ML SOAJ; Inject 0.5 mg into the skin once a week.  Dispense: 2 mL; Refill: 0  [name] is currently in the action stage of  change. As such, her goal is to continue with weight loss efforts. She has agreed to keeping a food journal and adhering to recommended goals of 1200 calories and 80 grams of protein daily.  We discussed eating small frequent meals throughout day since appetite is down and she gets satiated very quickly.   Exercise goals: Continue current regimen.   Behavioral modification strategies: increasing lean protein intake, decreasing simple carbohydrates and meal planning and cooking strategies.  [name] has agreed to follow-up with our clinic in 5 weeks.   Objective:   Blood pressure 111/72, pulse 80, temperature 98.3 F (36.8 C), height 5\' 4"  (1.626 m), weight 185 lb (83.9 kg), last menstrual period 12/15/2019, SpO2 98 %. Body mass index is 31.76 kg/m.  General: Cooperative, alert, well developed, in no acute distress. HEENT: Conjunctivae and lids unremarkable. Cardiovascular: Regular rhythm.  Lungs: Normal work of breathing. Neurologic: No focal deficits.   Lab Results  Component Value Date   CREATININE 0.73 12/22/2019   BUN 14 12/22/2019   NA 143 12/22/2019   K 4.9 12/22/2019   CL 106 12/22/2019   CO2 23 12/22/2019   Lab Results  Component Value Date   ALT 11 12/22/2019   AST 13 12/22/2019   ALKPHOS 54 12/22/2019   BILITOT 0.3 12/22/2019   Lab Results  Component Value Date   HGBA1C 4.7 (L) 12/22/2019   HGBA1C 5.0 10/16/2018   Lab Results  Component Value Date  INSULIN 6.0 05/27/2020   INSULIN 6.5 12/22/2019   INSULIN 3.6 10/16/2018   Lab Results  Component Value Date   TSH 3.890 12/22/2019   Lab Results  Component Value Date   CHOL 158 12/22/2019   HDL 51 12/22/2019   LDLCALC 96 12/22/2019   TRIG 54 12/22/2019   Lab Results  Component Value Date   WBC 6.4 12/22/2019   HGB 13.6 12/22/2019   HCT 40.2 12/22/2019   MCV 94 12/22/2019   PLT 242 12/22/2019   Attestation Statements:   Reviewed by clinician on day of visit: allergies, medications, problem  list, medical history, surgical history, family history, social history, and previous encounter notes.  I, Water quality scientist, CMA, am acting as Location manager for [employer], [place].  I have reviewed the above documentation for accuracy and completeness, and I agree with the above. -  Georgianne Fick, FNP

## 2020-11-02 ENCOUNTER — Ambulatory Visit (INDEPENDENT_AMBULATORY_CARE_PROVIDER_SITE_OTHER): Payer: 59 | Admitting: Psychology

## 2020-11-02 DIAGNOSIS — F418 Other specified anxiety disorders: Secondary | ICD-10-CM

## 2020-11-10 ENCOUNTER — Ambulatory Visit (INDEPENDENT_AMBULATORY_CARE_PROVIDER_SITE_OTHER): Payer: 59 | Admitting: Psychology

## 2020-11-10 DIAGNOSIS — F418 Other specified anxiety disorders: Secondary | ICD-10-CM

## 2020-11-11 MED FILL — WEGOVY 0.5 MG/0.5ML SOAJ: 0.5 | 28 days supply | Qty: 2 | Fill #0

## 2020-11-25 ENCOUNTER — Ambulatory Visit (INDEPENDENT_AMBULATORY_CARE_PROVIDER_SITE_OTHER): Payer: 59 | Admitting: Psychology

## 2020-11-25 DIAGNOSIS — F418 Other specified anxiety disorders: Secondary | ICD-10-CM | POA: Diagnosis not present

## 2020-11-30 ENCOUNTER — Ambulatory Visit (INDEPENDENT_AMBULATORY_CARE_PROVIDER_SITE_OTHER): Payer: 59 | Admitting: Family Medicine

## 2020-12-01 ENCOUNTER — Ambulatory Visit (INDEPENDENT_AMBULATORY_CARE_PROVIDER_SITE_OTHER): Payer: 59 | Admitting: Psychology

## 2020-12-01 DIAGNOSIS — F418 Other specified anxiety disorders: Secondary | ICD-10-CM | POA: Diagnosis not present

## 2020-12-06 ENCOUNTER — Encounter (INDEPENDENT_AMBULATORY_CARE_PROVIDER_SITE_OTHER): Payer: Self-pay | Admitting: Family Medicine

## 2020-12-06 ENCOUNTER — Ambulatory Visit (INDEPENDENT_AMBULATORY_CARE_PROVIDER_SITE_OTHER): Payer: 59 | Admitting: Family Medicine

## 2020-12-06 ENCOUNTER — Other Ambulatory Visit (INDEPENDENT_AMBULATORY_CARE_PROVIDER_SITE_OTHER): Payer: Self-pay | Admitting: Family Medicine

## 2020-12-06 ENCOUNTER — Other Ambulatory Visit: Payer: Self-pay

## 2020-12-06 VITALS — BP 107/70 | HR 65 | Temp 97.6°F | Ht 64.0 in | Wt 188.0 lb

## 2020-12-06 DIAGNOSIS — E669 Obesity, unspecified: Secondary | ICD-10-CM | POA: Diagnosis not present

## 2020-12-06 DIAGNOSIS — Z6832 Body mass index (BMI) 32.0-32.9, adult: Secondary | ICD-10-CM

## 2020-12-06 DIAGNOSIS — F3289 Other specified depressive episodes: Secondary | ICD-10-CM | POA: Diagnosis not present

## 2020-12-06 DIAGNOSIS — E559 Vitamin D deficiency, unspecified: Secondary | ICD-10-CM

## 2020-12-06 MED ORDER — WEGOVY 1 MG/0.5ML ~~LOC~~ SOAJ: 1.0000 mg | SUBCUTANEOUS | 0 refills | Status: DC

## 2020-12-06 MED FILL — WEGOVY 1 MG/0.5ML SOAJ: 1 | 28 days supply | Qty: 2 | Fill #0

## 2020-12-07 DIAGNOSIS — Z1152 Encounter for screening for COVID-19: Secondary | ICD-10-CM | POA: Diagnosis not present

## 2020-12-08 ENCOUNTER — Encounter (INDEPENDENT_AMBULATORY_CARE_PROVIDER_SITE_OTHER): Payer: Self-pay

## 2020-12-08 ENCOUNTER — Ambulatory Visit (INDEPENDENT_AMBULATORY_CARE_PROVIDER_SITE_OTHER): Payer: 59 | Admitting: Psychology

## 2020-12-08 DIAGNOSIS — F418 Other specified anxiety disorders: Secondary | ICD-10-CM

## 2020-12-08 NOTE — Progress Notes (Addendum)
Chief Complaint:   OBESITY [name] is here to discuss her progress with her obesity treatment plan along with follow-up of her obesity related diagnoses. [name] is on keeping a food journal and adhering to recommended goals of 1200 calories and 80 grams of protein and states she is following her eating plan approximately 50% of the time. [name] states she is walking, strength training and yoga 60 minutes 6 times per week.  Today's visit was #: 40 Starting weight: 191 lbs Starting date: 10/16/2018 Today's weight: 188 lbs Today's date: 12/06/2020 Total lbs lost to date: 3 lbs Total lbs lost since last in-office visit: 0  Interim History: [name] notes being off plan over the holidays. She is not surprised with weight gain. [name] has a little nausea with Wegovy. She notes an increase in appetite recently.  She is currently on 0.5 mg Wegovy.  Subjective:   1. Vitamin D deficiency [name]'s Vitamin D level is at goal. She is taking 5,000 over the counter.   2. Other depression, with emotional eating [name] is taking bupropion once daily in the morning. She notes cravings are worse since Christmas. She is on Wegovy which does somewhat reduce cravings.   Assessment/Plan:   1. Vitamin D deficiency We will check Vitamin D. [name] will continue over the counter Vitamin D 5,000 IU.  2. Other depression, with emotional eating [name] will continue bupropion once daily in the morning.  3. Class 1 obesity with serious comorbidity and body mass index (BMI) of 32.0 to 32.9 in adult, unspecified obesity type  [name] is currently in the action stage of change. As such, her goal is to continue with weight loss efforts. She has agreed to keeping a food journal and adhering to recommended goals of 1200 calories and 80 grams of  protein daily.   - Semaglutide-Weight Management (WEGOVY) 1 MG/0.5ML SOAJ; Inject 1 mg into the skin once a week.  Dispense: 2 mL; Refill: 0  Exercise goals: As  is.  Behavioral modification strategies: keeping a strict food journal.  [name] has agreed to follow-up with our clinic in 3-4 weeks.     Objective:   Blood pressure 107/70, pulse 65, temperature 97.6 F (36.4 C), height 5\' 4"  (1.626 m), weight 188 lb (85.3 kg), last menstrual period 12/01/2020, SpO2 99 %. Body mass index is 32.27 kg/m.  General: Cooperative, alert, well developed, in no acute distress. HEENT: Conjunctivae and lids unremarkable. Cardiovascular: Regular rhythm.  Lungs: Normal work of breathing. Neurologic: No focal deficits.   Lab Results  Component Value Date   CREATININE 0.73 12/22/2019   BUN 14 12/22/2019   NA 143 12/22/2019   K 4.9 12/22/2019   CL 106 12/22/2019   CO2 23 12/22/2019   Lab Results  Component Value Date   ALT 11 12/22/2019   AST 13 12/22/2019   ALKPHOS 54 12/22/2019   BILITOT 0.3 12/22/2019   Lab Results  Component Value Date   HGBA1C 4.7 (L) 12/22/2019   HGBA1C 5.0 10/16/2018   Lab Results  Component Value Date   INSULIN 6.0 05/27/2020   INSULIN 6.5 12/22/2019   INSULIN 3.6 10/16/2018   Lab Results  Component Value Date   TSH 3.890 12/22/2019   Lab Results  Component Value Date   CHOL 158 12/22/2019   HDL 51 12/22/2019   LDLCALC 96 12/22/2019   TRIG 54 12/22/2019   Lab Results  Component Value Date   WBC 6.4 12/22/2019   HGB 13.6 12/22/2019  HCT 40.2 12/22/2019   MCV 94 12/22/2019   PLT 242 12/22/2019   No results found for: IRON, TIBC, FERRITIN   Attestation Statements:   Reviewed by clinician on day of visit: allergies, medications, problem list, medical history, surgical history, family history, social history, and previous encounter notes.  Tula Nakayama, am acting as Location manager for Georgianne Fick, FNP.  I have reviewed the above documentation for accuracy and completeness, and I agree with the above. -  Georgianne Fick, FNP

## 2020-12-13 ENCOUNTER — Encounter (INDEPENDENT_AMBULATORY_CARE_PROVIDER_SITE_OTHER): Payer: Self-pay | Admitting: Family Medicine

## 2020-12-20 ENCOUNTER — Ambulatory Visit (INDEPENDENT_AMBULATORY_CARE_PROVIDER_SITE_OTHER): Payer: 59 | Admitting: Psychology

## 2020-12-20 DIAGNOSIS — F418 Other specified anxiety disorders: Secondary | ICD-10-CM

## 2020-12-22 DIAGNOSIS — Z1231 Encounter for screening mammogram for malignant neoplasm of breast: Secondary | ICD-10-CM | POA: Diagnosis not present

## 2020-12-23 ENCOUNTER — Other Ambulatory Visit (HOSPITAL_COMMUNITY): Payer: Self-pay | Admitting: Physician Assistant

## 2020-12-23 DIAGNOSIS — M25552 Pain in left hip: Secondary | ICD-10-CM | POA: Diagnosis not present

## 2020-12-23 DIAGNOSIS — M5459 Other low back pain: Secondary | ICD-10-CM | POA: Diagnosis not present

## 2020-12-23 MED FILL — METHOCARBAMOL 500 MG TABS: 500 | 15 days supply | Qty: 60 | Fill #0

## 2020-12-23 MED FILL — predniSONE 10 MG (21) TBPK: 10 | 6 days supply | Qty: 21 | Fill #0

## 2020-12-28 ENCOUNTER — Ambulatory Visit (INDEPENDENT_AMBULATORY_CARE_PROVIDER_SITE_OTHER): Payer: 59 | Admitting: Family Medicine

## 2020-12-31 ENCOUNTER — Ambulatory Visit: Payer: 59 | Admitting: Psychology

## 2021-01-04 ENCOUNTER — Other Ambulatory Visit: Payer: Self-pay | Admitting: Student

## 2021-01-04 DIAGNOSIS — M5416 Radiculopathy, lumbar region: Secondary | ICD-10-CM

## 2021-01-04 DIAGNOSIS — M25552 Pain in left hip: Secondary | ICD-10-CM | POA: Diagnosis not present

## 2021-01-05 ENCOUNTER — Ambulatory Visit (INDEPENDENT_AMBULATORY_CARE_PROVIDER_SITE_OTHER): Payer: 59 | Admitting: Family Medicine

## 2021-01-05 ENCOUNTER — Encounter (INDEPENDENT_AMBULATORY_CARE_PROVIDER_SITE_OTHER): Payer: Self-pay | Admitting: Family Medicine

## 2021-01-05 ENCOUNTER — Other Ambulatory Visit (INDEPENDENT_AMBULATORY_CARE_PROVIDER_SITE_OTHER): Payer: Self-pay | Admitting: Family Medicine

## 2021-01-05 ENCOUNTER — Other Ambulatory Visit: Payer: Self-pay

## 2021-01-05 VITALS — BP 103/64 | HR 77 | Temp 97.6°F | Ht 64.0 in | Wt 187.0 lb

## 2021-01-05 DIAGNOSIS — E669 Obesity, unspecified: Secondary | ICD-10-CM

## 2021-01-05 DIAGNOSIS — F3289 Other specified depressive episodes: Secondary | ICD-10-CM | POA: Diagnosis not present

## 2021-01-05 DIAGNOSIS — Z6832 Body mass index (BMI) 32.0-32.9, adult: Secondary | ICD-10-CM

## 2021-01-05 DIAGNOSIS — E559 Vitamin D deficiency, unspecified: Secondary | ICD-10-CM | POA: Diagnosis not present

## 2021-01-05 MED ORDER — WEGOVY 1 MG/0.5ML ~~LOC~~ SOAJ: 1.0000 mg | SUBCUTANEOUS | 0 refills | Status: DC

## 2021-01-05 MED ORDER — BUPROPION HCL ER (SR) 150 MG PO TB12: 150.0000 mg | ORAL_TABLET | Freq: Two times a day (BID) | ORAL | 0 refills | Status: DC

## 2021-01-05 MED FILL — BUPROPION HCL SR 150 MG TAB: 150 | 30 days supply | Qty: 60 | Fill #0

## 2021-01-05 MED FILL — WEGOVY 1 MG/0.5ML SOAJ: 1 | 28 days supply | Qty: 2 | Fill #0

## 2021-01-06 ENCOUNTER — Encounter (INDEPENDENT_AMBULATORY_CARE_PROVIDER_SITE_OTHER): Payer: Self-pay | Admitting: Family Medicine

## 2021-01-06 NOTE — Progress Notes (Signed)
Chief Complaint:   OBESITY [name] is here to discuss her progress with her obesity treatment plan along with follow-up of her obesity related diagnoses. [name] is on keeping a food journal and adhering to recommended goals of 1200 calories and 80 grams of protein daily and states she is following her eating plan approximately 60% of the time. [name] states she is walking and doing resistance for 40-45 minutes 6 times per week.  Today's visit was #: 62 Starting weight: 191 lbs Starting date: 10/16/2018 Today's weight: 187 lbs Today's date: 01/05/2021 Total lbs lost to date: 4 Total lbs lost since last in-office visit: 1  Interim History: [name] journals most days. She goes over on her calories 100-200 per day. However, she meets her protein goals. Her exercise is off some due to recent back issues. She has had some nausea and she notes healthy foods are not enticing to her. She notes stress manifests for her in her stomach. She is on Wegovy 1.0 mg and she notes her appetite is suppressed.  Subjective:   1. Vitamin D deficiency [name] has been off of 5,000 IU Vit D, and only taking 2,000 IU because she ran out of 5000. Last Vit D level was at goal at 55.  2. Other depression, with emotional eating [name]'s mood is stable on bupropion. She notes her cravings are pretty well controlled.  Assessment/Plan:   1. Vitamin D deficiency  [name] agreed to continue taking OTC Vit D 5,000 IU daily and will follow-up for routine testing of Vitamin D, at least 2-3 times per year to avoid over-replacement.  2. Other depression, with emotional eating We will refill bupropion. Orders and follow up as documented in patient record.   - buPROPion (WELLBUTRIN SR) 150 MG 12 hr tablet; Take 1 tablet (150 mg total) by mouth 2 (two) times daily.  Dispense: 60 tablet; Refill: 0  3. Class 1 obesity with serious comorbidity and body mass index (BMI) of 32.0 to 32.9 in adult, unspecified obesity  type [name] is currently in the action stage of change. As such, her goal is to continue with weight loss efforts. She has agreed to the Category 2 Plan.   [name] switch to the Category 2 for more structure.  We discussed various medication options to help [name] with her weight loss efforts and we both agreed to continue taking Wegovy, and we will refill for 1 month.  - Semaglutide-Weight Management (WEGOVY) 1 MG/0.5ML SOAJ; Inject 1 mg into the skin once a week.  Dispense: 2 mL; Refill: 0  Exercise goals: As is.  Behavioral modification strategies: decreasing simple carbohydrates, meal planning and cooking strategies and planning for success.  [name] has agreed to follow-up with our clinic in 4 weeks.   Objective:   Blood pressure 103/64, pulse 77, temperature 97.6 F (36.4 C), height 5\' 4"  (1.626 m), weight 187 lb (84.8 kg), SpO2 98 %. Body mass index is 32.1 kg/m.  General: Cooperative, alert, well developed, in no acute distress. HEENT: Conjunctivae and lids unremarkable. Cardiovascular: Regular rhythm.  Lungs: Normal work of breathing. Neurologic: No focal deficits.   Lab Results  Component Value Date   CREATININE 0.73 12/22/2019   BUN 14 12/22/2019   NA 143 12/22/2019   K 4.9 12/22/2019   CL 106 12/22/2019   CO2 23 12/22/2019   Lab Results  Component Value Date   ALT 11 12/22/2019   AST 13 12/22/2019   ALKPHOS 54 12/22/2019   BILITOT 0.3 12/22/2019  Lab Results  Component Value Date   HGBA1C 4.7 (L) 12/22/2019   HGBA1C 5.0 10/16/2018   Lab Results  Component Value Date   INSULIN 6.0 05/27/2020   INSULIN 6.5 12/22/2019   INSULIN 3.6 10/16/2018   Lab Results  Component Value Date   TSH 3.890 12/22/2019   Lab Results  Component Value Date   CHOL 158 12/22/2019   HDL 51 12/22/2019   LDLCALC 96 12/22/2019   TRIG 54 12/22/2019   Lab Results  Component Value Date   WBC 6.4 12/22/2019   HGB 13.6 12/22/2019   HCT 40.2 12/22/2019   MCV 94  12/22/2019   PLT 242 12/22/2019   No results found for: IRON, TIBC, FERRITIN  Attestation Statements:   Reviewed by clinician on day of visit: allergies, medications, problem list, medical history, surgical history, family history, social history, and previous encounter notes.   Wilhemena Durie, am acting as Location manager for [employer], FNP-C.  I have reviewed the above documentation for accuracy and completeness, and I agree with the above. -  Georgianne Fick, FNP

## 2021-01-13 ENCOUNTER — Encounter (INDEPENDENT_AMBULATORY_CARE_PROVIDER_SITE_OTHER): Payer: Self-pay

## 2021-01-14 ENCOUNTER — Ambulatory Visit (INDEPENDENT_AMBULATORY_CARE_PROVIDER_SITE_OTHER): Payer: 59 | Admitting: Psychology

## 2021-01-14 DIAGNOSIS — F418 Other specified anxiety disorders: Secondary | ICD-10-CM

## 2021-01-21 ENCOUNTER — Ambulatory Visit (INDEPENDENT_AMBULATORY_CARE_PROVIDER_SITE_OTHER): Payer: 59 | Admitting: Psychology

## 2021-01-21 DIAGNOSIS — F418 Other specified anxiety disorders: Secondary | ICD-10-CM | POA: Diagnosis not present

## 2021-01-23 ENCOUNTER — Ambulatory Visit
Admission: RE | Admit: 2021-01-23 | Discharge: 2021-01-23 | Disposition: A | Discharge status: Home / Self Care | Payer: 59 | Source: Ambulatory Visit | Attending: Student | Admitting: Student

## 2021-01-23 DIAGNOSIS — M5416 Radiculopathy, lumbar region: Secondary | ICD-10-CM

## 2021-01-23 DIAGNOSIS — M5127 Other intervertebral disc displacement, lumbosacral region: Secondary | ICD-10-CM | POA: Diagnosis not present

## 2021-01-25 DIAGNOSIS — M5416 Radiculopathy, lumbar region: Secondary | ICD-10-CM | POA: Diagnosis not present

## 2021-02-02 ENCOUNTER — Ambulatory Visit (INDEPENDENT_AMBULATORY_CARE_PROVIDER_SITE_OTHER): Payer: 59 | Admitting: Psychology

## 2021-02-02 ENCOUNTER — Ambulatory Visit (INDEPENDENT_AMBULATORY_CARE_PROVIDER_SITE_OTHER): Payer: 59 | Admitting: Family Medicine

## 2021-02-02 DIAGNOSIS — F331 Major depressive disorder, recurrent, moderate: Secondary | ICD-10-CM | POA: Diagnosis not present

## 2021-02-07 ENCOUNTER — Ambulatory Visit (INDEPENDENT_AMBULATORY_CARE_PROVIDER_SITE_OTHER): Payer: 59 | Admitting: Physician Assistant

## 2021-02-07 ENCOUNTER — Other Ambulatory Visit: Payer: Self-pay

## 2021-02-07 ENCOUNTER — Other Ambulatory Visit (INDEPENDENT_AMBULATORY_CARE_PROVIDER_SITE_OTHER): Payer: Self-pay | Admitting: Physician Assistant

## 2021-02-07 VITALS — BP 108/74 | HR 89 | Temp 98.3°F | Ht 64.0 in | Wt 188.0 lb

## 2021-02-07 DIAGNOSIS — E669 Obesity, unspecified: Secondary | ICD-10-CM

## 2021-02-07 DIAGNOSIS — E559 Vitamin D deficiency, unspecified: Secondary | ICD-10-CM | POA: Diagnosis not present

## 2021-02-07 DIAGNOSIS — Z6831 Body mass index (BMI) 31.0-31.9, adult: Secondary | ICD-10-CM

## 2021-02-07 DIAGNOSIS — Z9189 Other specified personal risk factors, not elsewhere classified: Secondary | ICD-10-CM

## 2021-02-07 MED ORDER — WEGOVY 0.5 MG/0.5ML ~~LOC~~ SOAJ: 0.5000 mg | SUBCUTANEOUS | 0 refills | Status: DC

## 2021-02-09 NOTE — Progress Notes (Signed)
Chief Complaint:   OBESITY [name] is here to discuss her progress with her obesity treatment plan along with follow-up of her obesity related diagnoses. [name] is on the Category 2 Plan and states she is following her eating plan approximately 50% of the time. [name] states she is walking and doing cardio for 30-40 minutes 6 times per week.  Today's visit was #: 7 Starting weight: 191 lbs Starting date: 10/16/2018 Today's weight: 188 lbs Today's date: 02/09/2021 Total lbs lost to date: 3 Total lbs lost since last in-office visit: 0  Interim History: [name] reports that Mancel Parsons makes her not want to eat, but she tends to snack on things that sound good. She feels that she could do better on Wegovy 0.5 mg. She reports having a lot of cravings for sweets.  Subjective:   1. Vitamin D deficiency [name] is on Vit D, and she denies nausea, vomiting, or muscle weakness.  2. At risk for osteoporosis [name] is at higher risk of osteopenia and osteoporosis due to Vitamin D deficiency.   Assessment/Plan:   1. Vitamin D deficiency Low Vitamin D level contributes to fatigue and are associated with obesity, breast, and colon cancer. [name] agreed to continue taking Vitamin D 5,000 IU daily and will follow-up for routine testing of Vitamin D, at least 2-3 times per year to avoid over-replacement.  2. At risk for osteoporosis [name] was given approximately 15 minutes of osteoporosis prevention counseling today. [name] is at risk for osteopenia and osteoporosis due to her Vitamin D deficiency. She was encouraged to take her Vitamin D and follow her higher calcium diet and increase strengthening exercise to help strengthen her bones and decrease her risk of osteopenia and osteoporosis.  Repetitive spaced learning was employed today to elicit superior memory formation and behavioral change.  3. Class 1 obesity with serious comorbidity and body mass index (BMI) of 31.0 to 31.9 in adult,  unspecified obesity type [name] is currently in the action stage of change. As such, her goal is to continue with weight loss efforts. She has agreed to following a lower carbohydrate, vegetable and lean protein rich diet plan.   We discussed various medication options to help [name] with her weight loss efforts and we both agreed to continue [place], and we will refill for 1 month.  - Semaglutide-Weight Management (WEGOVY) 0.5 MG/0.5ML SOAJ; Inject 0.5 mg into the skin once a week.  Dispense: 2 mL; Refill: 0  Exercise goals: As is.  Behavioral modification strategies: increasing lean protein intake and meal planning and cooking strategies.  [name] has agreed to follow-up with our clinic in 2 weeks. She was informed of the importance of frequent follow-up visits to maximize her success with intensive lifestyle modifications for her multiple health conditions.   Objective:   Blood pressure 108/74, pulse 89, temperature 98.3 F (36.8 C), height 5\' 4"  (1.626 m), weight 188 lb (85.3 kg), SpO2 97 %. Body mass index is 32.27 kg/m.  General: Cooperative, alert, well developed, in no acute distress. HEENT: Conjunctivae and lids unremarkable. Cardiovascular: Regular rhythm.  Lungs: Normal work of breathing. Neurologic: No focal deficits.   Lab Results  Component Value Date   CREATININE 0.73 12/22/2019   BUN 14 12/22/2019   NA 143 12/22/2019   K 4.9 12/22/2019   CL 106 12/22/2019   CO2 23 12/22/2019   Lab Results  Component Value Date   ALT 11 12/22/2019   AST 13 12/22/2019   ALKPHOS 54 12/22/2019  BILITOT 0.3 12/22/2019   Lab Results  Component Value Date   HGBA1C 4.7 (L) 12/22/2019   HGBA1C 5.0 10/16/2018   Lab Results  Component Value Date   INSULIN 6.0 05/27/2020   INSULIN 6.5 12/22/2019   INSULIN 3.6 10/16/2018   Lab Results  Component Value Date   TSH 3.890 12/22/2019   Lab Results  Component Value Date   CHOL 158 12/22/2019   HDL 51 12/22/2019   LDLCALC 96  12/22/2019   TRIG 54 12/22/2019   Lab Results  Component Value Date   WBC 6.4 12/22/2019   HGB 13.6 12/22/2019   HCT 40.2 12/22/2019   MCV 94 12/22/2019   PLT 242 12/22/2019   No results found for: IRON, TIBC, FERRITIN  Attestation Statements:   Reviewed by clinician on day of visit: allergies, medications, problem list, medical history, surgical history, family history, social history, and previous encounter notes.   Wilhemena Durie, am acting as transcriptionist for [employer], PA-C.  I have reviewed the above documentation for accuracy and completeness, and I agree with the above. Abby Potash, PA-C

## 2021-02-17 ENCOUNTER — Ambulatory Visit (INDEPENDENT_AMBULATORY_CARE_PROVIDER_SITE_OTHER): Payer: 59 | Admitting: Psychology

## 2021-02-17 DIAGNOSIS — F418 Other specified anxiety disorders: Secondary | ICD-10-CM

## 2021-02-18 ENCOUNTER — Other Ambulatory Visit (HOSPITAL_BASED_OUTPATIENT_CLINIC_OR_DEPARTMENT_OTHER): Payer: Self-pay

## 2021-02-22 ENCOUNTER — Other Ambulatory Visit (HOSPITAL_COMMUNITY): Payer: Self-pay | Admitting: Ophthalmology

## 2021-02-22 DIAGNOSIS — H5201 Hypermetropia, right eye: Secondary | ICD-10-CM | POA: Diagnosis not present

## 2021-02-23 ENCOUNTER — Other Ambulatory Visit: Payer: Self-pay

## 2021-02-23 ENCOUNTER — Encounter (INDEPENDENT_AMBULATORY_CARE_PROVIDER_SITE_OTHER): Payer: Self-pay | Admitting: Physician Assistant

## 2021-02-23 ENCOUNTER — Other Ambulatory Visit (INDEPENDENT_AMBULATORY_CARE_PROVIDER_SITE_OTHER): Payer: Self-pay | Admitting: Physician Assistant

## 2021-02-23 ENCOUNTER — Ambulatory Visit (INDEPENDENT_AMBULATORY_CARE_PROVIDER_SITE_OTHER): Payer: 59 | Admitting: Physician Assistant

## 2021-02-23 VITALS — BP 108/69 | HR 72 | Temp 97.8°F | Ht 64.0 in | Wt 187.0 lb

## 2021-02-23 DIAGNOSIS — E559 Vitamin D deficiency, unspecified: Secondary | ICD-10-CM | POA: Diagnosis not present

## 2021-02-23 DIAGNOSIS — F3289 Other specified depressive episodes: Secondary | ICD-10-CM

## 2021-02-23 DIAGNOSIS — E669 Obesity, unspecified: Secondary | ICD-10-CM | POA: Diagnosis not present

## 2021-02-23 DIAGNOSIS — Z9189 Other specified personal risk factors, not elsewhere classified: Secondary | ICD-10-CM | POA: Diagnosis not present

## 2021-02-23 DIAGNOSIS — Z6832 Body mass index (BMI) 32.0-32.9, adult: Secondary | ICD-10-CM

## 2021-02-23 MED ORDER — BUPROPION HCL ER (SR) 150 MG PO TB12: 150.0000 mg | ORAL_TABLET | Freq: Two times a day (BID) | ORAL | 0 refills | Status: DC

## 2021-02-23 MED FILL — BUPROPION HCL SR 150 MG TAB: 150 | 30 days supply | Qty: 60 | Fill #0

## 2021-02-25 ENCOUNTER — Ambulatory Visit (INDEPENDENT_AMBULATORY_CARE_PROVIDER_SITE_OTHER): Payer: 59 | Admitting: Psychology

## 2021-02-25 DIAGNOSIS — F418 Other specified anxiety disorders: Secondary | ICD-10-CM

## 2021-02-25 MED FILL — XIIDRA 5 % SOLN: 5 | 90 days supply | Qty: 180 | Fill #0

## 2021-03-01 NOTE — Progress Notes (Signed)
Chief Complaint:   OBESITY [name] is here to discuss her progress with her obesity treatment plan along with follow-up of her obesity related diagnoses. [name] is on following a lower carbohydrate, vegetable and lean protein rich diet plan and states she is following her eating plan approximately 80% of the time. [name] states she is walking 4 miles 2-3 times per week, and doing strengthening for 40 minutes 5-6 times per week.  Today's visit was #: 40 Starting weight: 191 lbs Starting date: 10/16/2018 Today's weight: 187 lbs Today's date: 02/23/2021 Total lbs lost to date: 4 Total lbs lost since last in-office visit: 1  Interim History: [name] followed the Low carbohydrate plan well with the exception of 3 birthday celebrations. She did not take [facility] for 2 weeks and she feels better off of it. She notes her cravings have decreased on the Low carbohydrate plan. She meal planned well.  Subjective:   1. Vitamin D deficiency Sapir is on OTC Vit D daily, and she denies nausea, vomiting, or muscle weakness.  2. Other depression, with emotional eating [name] is not taking her second dose of bupropion, as she forgets to take it. She feels like her mood may benefit from BID.  3. At risk for medication nonadherence [name] is at a higher than average risk for medication non-adherence due to lack of scheduling with taking her medications.  Assessment/Plan:   1. Vitamin D deficiency Low Vitamin D level contributes to fatigue and are associated with obesity, breast, and colon cancer. [name] agreed to continue taking OTC Vitamin D and will follow-up for routine testing of Vitamin D, at least 2-3 times per year to avoid over-replacement.  2. Other depression, with emotional eating Behavior modification techniques were discussed today to help [name] deal with her emotional/non-hunger eating behaviors. We will refill bupropion 150 mg BID #60 with no refills. [name] is to take  bupropion BID as prescribed. Orders and follow up as documented in patient record.   3. At risk for medication nonadherence [name] was given approximately 15 minutes of counseling today to help her avoid medication non-adherence.  We discussed importance of taking medications at a similar time each day and the use of daily pill organizers to help improve medication adherence.  Repetitive spaced learning was employed today to elicit superior memory formation and behavioral change.  4. Class 1 obesity with serious comorbidity and body mass index (BMI) of 32.0 to 32.9 in adult, unspecified obesity type [name] is currently in the action stage of change. As such, her goal is to continue with weight loss efforts. She has agreed to following a lower carbohydrate, vegetable and lean protein rich diet plan.   Exercise goals: As is.  Behavioral modification strategies: meal planning and cooking strategies and keeping healthy foods in the home.  [name] has agreed to follow-up with our clinic in 2 weeks. She was informed of the importance of frequent follow-up visits to maximize her success with intensive lifestyle modifications for her multiple health conditions.   Objective:   Blood pressure 108/69, pulse 72, temperature 97.8 F (36.6 C), height 5\' 4"  (1.626 m), weight 187 lb (84.8 kg), last menstrual period 02/16/2021, SpO2 98 %. Body mass index is 32.1 kg/m.  General: Cooperative, alert, well developed, in no acute distress. HEENT: Conjunctivae and lids unremarkable. Cardiovascular: Regular rhythm.  Lungs: Normal work of breathing. Neurologic: No focal deficits.   Lab Results  Component Value Date   CREATININE 0.73 12/22/2019   BUN 14 12/22/2019  NA 143 12/22/2019   K 4.9 12/22/2019   CL 106 12/22/2019   CO2 23 12/22/2019   Lab Results  Component Value Date   ALT 11 12/22/2019   AST 13 12/22/2019   ALKPHOS 54 12/22/2019   BILITOT 0.3 12/22/2019   Lab Results  Component Value  Date   HGBA1C 4.7 (L) 12/22/2019   HGBA1C 5.0 10/16/2018   Lab Results  Component Value Date   INSULIN 6.0 05/27/2020   INSULIN 6.5 12/22/2019   INSULIN 3.6 10/16/2018   Lab Results  Component Value Date   TSH 3.890 12/22/2019   Lab Results  Component Value Date   CHOL 158 12/22/2019   HDL 51 12/22/2019   LDLCALC 96 12/22/2019   TRIG 54 12/22/2019   Lab Results  Component Value Date   WBC 6.4 12/22/2019   HGB 13.6 12/22/2019   HCT 40.2 12/22/2019   MCV 94 12/22/2019   PLT 242 12/22/2019   No results found for: IRON, TIBC, FERRITIN  Attestation Statements:   Reviewed by clinician on day of visit: allergies, medications, problem list, medical history, surgical history, family history, social history, and previous encounter notes.   Wilhemena Durie, am acting as transcriptionist for [employer], PA-C.  I have reviewed the above documentation for accuracy and completeness, and I agree with the above. Abby Potash, PA-C

## 2021-03-09 ENCOUNTER — Ambulatory Visit (INDEPENDENT_AMBULATORY_CARE_PROVIDER_SITE_OTHER): Payer: 59 | Admitting: Family Medicine

## 2021-03-10 ENCOUNTER — Ambulatory Visit (INDEPENDENT_AMBULATORY_CARE_PROVIDER_SITE_OTHER): Payer: 59 | Admitting: Psychology

## 2021-03-10 DIAGNOSIS — F418 Other specified anxiety disorders: Secondary | ICD-10-CM | POA: Diagnosis not present

## 2021-03-25 ENCOUNTER — Other Ambulatory Visit (HOSPITAL_COMMUNITY): Payer: Self-pay

## 2021-03-30 ENCOUNTER — Other Ambulatory Visit (HOSPITAL_COMMUNITY): Payer: Self-pay

## 2021-03-30 MED FILL — Lifitegrast Ophth Soln 5%: OPHTHALMIC | 90 days supply | Qty: 180 | Fill #0 | Status: CN

## 2021-03-31 ENCOUNTER — Other Ambulatory Visit (HOSPITAL_COMMUNITY): Payer: Self-pay

## 2021-04-07 ENCOUNTER — Other Ambulatory Visit: Payer: Self-pay

## 2021-04-07 ENCOUNTER — Other Ambulatory Visit (HOSPITAL_COMMUNITY): Payer: Self-pay

## 2021-04-07 ENCOUNTER — Encounter (INDEPENDENT_AMBULATORY_CARE_PROVIDER_SITE_OTHER): Payer: Self-pay | Admitting: Physician Assistant

## 2021-04-07 ENCOUNTER — Ambulatory Visit (INDEPENDENT_AMBULATORY_CARE_PROVIDER_SITE_OTHER): Payer: 59 | Admitting: Psychology

## 2021-04-07 ENCOUNTER — Ambulatory Visit (INDEPENDENT_AMBULATORY_CARE_PROVIDER_SITE_OTHER): Payer: 59 | Admitting: Physician Assistant

## 2021-04-07 VITALS — BP 106/69 | HR 59 | Temp 97.7°F | Ht 64.0 in | Wt 194.0 lb

## 2021-04-07 DIAGNOSIS — F3289 Other specified depressive episodes: Secondary | ICD-10-CM | POA: Diagnosis not present

## 2021-04-07 DIAGNOSIS — E669 Obesity, unspecified: Secondary | ICD-10-CM

## 2021-04-07 DIAGNOSIS — E8881 Metabolic syndrome: Secondary | ICD-10-CM | POA: Diagnosis not present

## 2021-04-07 DIAGNOSIS — F418 Other specified anxiety disorders: Secondary | ICD-10-CM | POA: Diagnosis not present

## 2021-04-07 DIAGNOSIS — Z6832 Body mass index (BMI) 32.0-32.9, adult: Secondary | ICD-10-CM | POA: Diagnosis not present

## 2021-04-07 DIAGNOSIS — Z9189 Other specified personal risk factors, not elsewhere classified: Secondary | ICD-10-CM

## 2021-04-07 MED ORDER — BUPROPION HCL ER (SR) 150 MG PO TB12
ORAL_TABLET | Freq: Two times a day (BID) | ORAL | 0 refills | Status: DC
  Filled 2021-04-07: qty 60, 30d supply, fill #0

## 2021-04-07 NOTE — Progress Notes (Signed)
Chief Complaint:   OBESITY [name] is here to discuss her progress with her obesity treatment plan along with follow-up of her obesity related diagnoses. [name] is on following a lower carbohydrate, vegetable and lean protein rich diet plan and states she is following her eating plan approximately 50% of the time. [name] states she is walking 4 miles and doing strength training for 30-40 minutes 3 times per week.  Today's visit was #: 63 Starting weight: 191 lbs Starting date: 10/16/2018 Today's weight: 194 lbs Today's date: 04/07/2021 Total lbs lost to date: 0 Total lbs lost since last in-office visit: 0  Interim History: [name] reports that she is tired of thinking about her food choices. She is frustrated with the lack of weight loss, but she recognizes that she needs to decreased her sweets.  Subjective:   1. Insulin resistance [name] is not on medications, and she denies polyphagia but reports cravings.  2. Other depression, with emotional eating [name] denies suicidal or homicidal ideas on Wellbutrin, but her cravings are not controlled. She cannot tolerate Topamax.  3. At risk for diabetes mellitus [name] is at higher than average risk for developing diabetes due to obesity.   Assessment/Plan:   1. Insulin resistance [name] will continue her meal plan, and will continue to work on weight loss, exercise, and decreasing simple carbohydrates to help decrease the risk of diabetes. [name] agreed to follow-up with [place] as directed to closely monitor her progress.  2. Other depression, with emotional eating Behavior modification techniques were discussed today to help [name] deal with her emotional/non-hunger eating behaviors. We will refill Wellbutrin SR for 1 month. Orders and follow up as documented in patient record.   - buPROPion (WELLBUTRIN SR) 150 MG 12 hr tablet; TAKE 1 TABLET BY MOUTH TWICE DAILY  Dispense: 60 tablet; Refill: 0  3. At risk for diabetes  mellitus [name] was given approximately 15 minutes of diabetes education and counseling today. We discussed intensive lifestyle modifications today with an emphasis on weight loss as well as increasing exercise and decreasing simple carbohydrates in her diet. We also reviewed medication options with an emphasis on risk versus benefit of those discussed.   Repetitive spaced learning was employed today to elicit superior memory formation and behavioral change.  4. Class 1 obesity with serious comorbidity and body mass index (BMI) of 32.0 to 32.9 in adult, unspecified obesity type [name] is currently in the action stage of change. As such, her goal is to continue with weight loss efforts. She has agreed to the Category 2 Plan and keeping a food journal and adhering to recommended goals of 400-500 calories and 35 grams of protein at supper daily.   Exercise goals: As is.  Behavioral modification strategies: meal planning and cooking strategies and keeping healthy foods in the home.  [name] has agreed to follow-up with our clinic in 3 weeks. She was informed of the importance of frequent follow-up visits to maximize her success with intensive lifestyle modifications for her multiple health conditions.   Objective:   Blood pressure 106/69, pulse (!) 59, temperature 97.7 F (36.5 C), height 5\' 4"  (1.626 m), weight 194 lb (88 kg), SpO2 99 %. Body mass index is 33.3 kg/m.  General: Cooperative, alert, well developed, in no acute distress. HEENT: Conjunctivae and lids unremarkable. Cardiovascular: Regular rhythm.  Lungs: Normal work of breathing. Neurologic: No focal deficits.   Lab Results  Component Value Date   CREATININE 0.73 12/22/2019   BUN 14 12/22/2019  NA 143 12/22/2019   K 4.9 12/22/2019   CL 106 12/22/2019   CO2 23 12/22/2019   Lab Results  Component Value Date   ALT 11 12/22/2019   AST 13 12/22/2019   ALKPHOS 54 12/22/2019   BILITOT 0.3 12/22/2019   Lab Results   Component Value Date   HGBA1C 4.7 (L) 12/22/2019   HGBA1C 5.0 10/16/2018   Lab Results  Component Value Date   INSULIN 6.0 05/27/2020   INSULIN 6.5 12/22/2019   INSULIN 3.6 10/16/2018   Lab Results  Component Value Date   TSH 3.890 12/22/2019   Lab Results  Component Value Date   CHOL 158 12/22/2019   HDL 51 12/22/2019   LDLCALC 96 12/22/2019   TRIG 54 12/22/2019   Lab Results  Component Value Date   WBC 6.4 12/22/2019   HGB 13.6 12/22/2019   HCT 40.2 12/22/2019   MCV 94 12/22/2019   PLT 242 12/22/2019   No results found for: IRON, TIBC, FERRITIN  Attestation Statements:   Reviewed by clinician on day of visit: allergies, medications, problem list, medical history, surgical history, family history, social history, and previous encounter notes.   Wilhemena Durie, am acting as transcriptionist for [employer], PA-C.  I have reviewed the above documentation for accuracy and completeness, and I agree with the above. Abby Potash, PA-C

## 2021-04-27 ENCOUNTER — Ambulatory Visit: Payer: 59 | Admitting: Psychology

## 2021-05-03 ENCOUNTER — Other Ambulatory Visit: Payer: Self-pay

## 2021-05-03 ENCOUNTER — Encounter (INDEPENDENT_AMBULATORY_CARE_PROVIDER_SITE_OTHER): Payer: Self-pay | Admitting: Physician Assistant

## 2021-05-03 ENCOUNTER — Ambulatory Visit (INDEPENDENT_AMBULATORY_CARE_PROVIDER_SITE_OTHER): Payer: 59 | Admitting: Physician Assistant

## 2021-05-03 VITALS — BP 100/67 | HR 76 | Temp 98.3°F | Ht 64.0 in | Wt 191.0 lb

## 2021-05-03 DIAGNOSIS — Z6832 Body mass index (BMI) 32.0-32.9, adult: Secondary | ICD-10-CM

## 2021-05-03 DIAGNOSIS — E559 Vitamin D deficiency, unspecified: Secondary | ICD-10-CM | POA: Diagnosis not present

## 2021-05-03 DIAGNOSIS — E669 Obesity, unspecified: Secondary | ICD-10-CM | POA: Diagnosis not present

## 2021-05-06 ENCOUNTER — Ambulatory Visit (INDEPENDENT_AMBULATORY_CARE_PROVIDER_SITE_OTHER): Payer: 59 | Admitting: Psychology

## 2021-05-06 DIAGNOSIS — F418 Other specified anxiety disorders: Secondary | ICD-10-CM | POA: Diagnosis not present

## 2021-05-11 NOTE — Progress Notes (Signed)
Chief Complaint:   OBESITY [name] is here to discuss her progress with her obesity treatment plan along with follow-up of her obesity related diagnoses. [name] is on the Category 2 Plan and states she is following her eating plan approximately 85% of the time. [name] states she is walking, doing yoga, and strengthening for 30-60 minutes 5-6 times per week.  Today's visit was #: 44 Starting weight: 191 lbs Starting date: 10/16/2018 Today's weight: 191 lbs Today's date: 05/03/2021 Total lbs lost to date: 0 Total lbs lost since last in-office visit: 3  Interim History: [name] did well with weight loss. She feels like the Category 2 plan is easier than journaling and better than the lower carbohydrate plan. Her hunger is controlled.  Subjective:   1. Vitamin D deficiency [name] is on Vit D 5,000 units daily.   Assessment/Plan:   1. Vitamin D deficiency Low Vitamin D level contributes to fatigue and are associated with obesity, breast, and colon cancer. [name] will continue Vitamin D 5,000 IU daily and will follow-up for routine testing of Vitamin D, at least 2-3 times per year to avoid over-replacement.  2. Class 1 obesity with serious comorbidity and body mass index (BMI) of 32.0 to 32.9 in adult, unspecified obesity type [name] is currently in the action stage of change. As such, her goal is to continue with weight loss efforts. She has agreed to the Category 2 Plan.   Exercise goals: As is.  Behavioral modification strategies: meal planning and cooking strategies and keeping healthy foods in the home.  [name] has agreed to follow-up with our clinic in 3 weeks. She was informed of the importance of frequent follow-up visits to maximize her success with intensive lifestyle modifications for her multiple health conditions.   Objective:   Blood pressure 100/67, pulse 76, temperature 98.3 F (36.8 C), height 5\' 4"  (1.626 m), weight 191 lb (86.6 kg), SpO2 99 %. Body mass  index is 32.79 kg/m.  General: Cooperative, alert, well developed, in no acute distress. HEENT: Conjunctivae and lids unremarkable. Cardiovascular: Regular rhythm.  Lungs: Normal work of breathing. Neurologic: No focal deficits.   Lab Results  Component Value Date   CREATININE 0.73 12/22/2019   BUN 14 12/22/2019   NA 143 12/22/2019   K 4.9 12/22/2019   CL 106 12/22/2019   CO2 23 12/22/2019   Lab Results  Component Value Date   ALT 11 12/22/2019   AST 13 12/22/2019   ALKPHOS 54 12/22/2019   BILITOT 0.3 12/22/2019   Lab Results  Component Value Date   HGBA1C 4.7 (L) 12/22/2019   HGBA1C 5.0 10/16/2018   Lab Results  Component Value Date   INSULIN 6.0 05/27/2020   INSULIN 6.5 12/22/2019   INSULIN 3.6 10/16/2018   Lab Results  Component Value Date   TSH 3.890 12/22/2019   Lab Results  Component Value Date   CHOL 158 12/22/2019   HDL 51 12/22/2019   LDLCALC 96 12/22/2019   TRIG 54 12/22/2019   Lab Results  Component Value Date   WBC 6.4 12/22/2019   HGB 13.6 12/22/2019   HCT 40.2 12/22/2019   MCV 94 12/22/2019   PLT 242 12/22/2019   No results found for: IRON, TIBC, FERRITIN  Attestation Statements:   Reviewed by clinician on day of visit: allergies, medications, problem list, medical history, surgical history, family history, social history, and previous encounter notes.  Time spent on visit including pre-visit chart review and post-visit care and charting was  23 minutes.    Wilhemena Durie, am acting as transcriptionist for [employer], PA-C.  I have reviewed the above documentation for accuracy and completeness, and I agree with the above. Abby Potash, PA-C

## 2021-05-31 ENCOUNTER — Ambulatory Visit (INDEPENDENT_AMBULATORY_CARE_PROVIDER_SITE_OTHER): Payer: 59 | Admitting: Physician Assistant

## 2021-05-31 ENCOUNTER — Encounter (INDEPENDENT_AMBULATORY_CARE_PROVIDER_SITE_OTHER): Payer: Self-pay | Admitting: Physician Assistant

## 2021-05-31 ENCOUNTER — Other Ambulatory Visit (HOSPITAL_COMMUNITY): Payer: Self-pay

## 2021-05-31 ENCOUNTER — Other Ambulatory Visit: Payer: Self-pay

## 2021-05-31 VITALS — BP 92/62 | HR 75 | Temp 97.9°F | Ht 64.0 in | Wt 194.0 lb

## 2021-05-31 DIAGNOSIS — Z9189 Other specified personal risk factors, not elsewhere classified: Secondary | ICD-10-CM | POA: Diagnosis not present

## 2021-05-31 DIAGNOSIS — F3289 Other specified depressive episodes: Secondary | ICD-10-CM | POA: Diagnosis not present

## 2021-05-31 DIAGNOSIS — Z6832 Body mass index (BMI) 32.0-32.9, adult: Secondary | ICD-10-CM

## 2021-05-31 DIAGNOSIS — E559 Vitamin D deficiency, unspecified: Secondary | ICD-10-CM | POA: Diagnosis not present

## 2021-05-31 DIAGNOSIS — E669 Obesity, unspecified: Secondary | ICD-10-CM

## 2021-05-31 MED ORDER — BUPROPION HCL ER (SR) 150 MG PO TB12
ORAL_TABLET | Freq: Two times a day (BID) | ORAL | 0 refills | Status: DC
Start: 2021-05-31 — End: 2021-06-29
  Filled 2021-05-31: qty 60, 30d supply, fill #0

## 2021-05-31 MED ORDER — VITAMIN D 125 MCG (5000 UT) PO CAPS: ORAL_CAPSULE | ORAL | 0 refills | Status: AC

## 2021-06-06 NOTE — Progress Notes (Signed)
Chief Complaint:   OBESITY [name] is here to discuss her progress with her obesity treatment plan along with follow-up of her obesity related diagnoses. [name] is on the Category 2 Plan and states she is following her eating plan approximately 75% of the time. [name] states she is doing cardio, walking, and strength exercise at the gym for 30-60 minutes 5-6 times per week.  Today's visit was #: 22 Starting weight: 191 lbs Starting date: 10/16/2018 Today's weight: 194 lbs Today's date: 05/31/2021 Total lbs lost to date: 0 Total lbs lost since last in-office visit: 0  Interim History: [name] feels that she has been overindulging in holiday food and alcohol. She enjoyed the switch back to the Category 2 plan. When she is in a rush, she sometimes struggles with breakfast.  Subjective:   1. Vitamin D deficiency [name] is on Vit D 5,000 units daily. Her last Vit D level had increased to 55.4. I discussed labs with the patient today.  2. Other depression, with emotional eating [name] has been more consistent with the evening dose.  3. At risk for osteoporosis [name] is at higher risk of osteopenia and osteoporosis due to Vitamin D deficiency.   Assessment/Plan:   1. Vitamin D deficiency Low Vitamin D level contributes to fatigue and are associated with obesity, breast, and colon cancer. [name] agreed to change to Vitamin D 5,000 IU every other day and will follow-up for routine testing of Vitamin D, at least 2-3 times per year to avoid over-replacement.  - Cholecalciferol (VITAMIN D) 125 MCG (5000 UT) CAPS; One cap every other day  Dispense: 30 capsule; Refill: 0  2. Other depression, with emotional eating Behavior modification techniques were discussed today to help [name] deal with her emotional/non-hunger eating behaviors. We will refill Wellbutrin SR for 1 month. Orders and follow up as documented in patient record.   - buPROPion (WELLBUTRIN SR) 150 MG 12 hr tablet;  TAKE 1 TABLET BY MOUTH TWICE DAILY  Dispense: 60 tablet; Refill: 0  3. At risk for osteoporosis [name] was given approximately 15 minutes of osteoporosis prevention counseling today. [name] is at risk for osteopenia and osteoporosis due to her Vitamin D deficiency. She was encouraged to take her Vitamin D and follow her higher calcium diet and increase strengthening exercise to help strengthen her bones and decrease her risk of osteopenia and osteoporosis.  Repetitive spaced learning was employed today to elicit superior memory formation and behavioral change.  4. Class 1 obesity with serious comorbidity and body mass index (BMI) of 32.0 to 32.9 in adult, unspecified obesity type [name] is currently in the action stage of change. As such, her goal is to continue with weight loss efforts. She has agreed to the Category 2 Plan.   Exercise goals: As is.  Behavioral modification strategies: increasing lean protein intake and decreasing simple carbohydrates.  [name] has agreed to follow-up with our clinic in 4 weeks. She was informed of the importance of frequent follow-up visits to maximize her success with intensive lifestyle modifications for her multiple health conditions.   Objective:   Blood pressure 92/62, pulse 75, temperature 97.9 F (36.6 C), height 5\' 4"  (1.626 m), weight 194 lb (88 kg), SpO2 97 %. Body mass index is 33.3 kg/m.  General: Cooperative, alert, well developed, in no acute distress. HEENT: Conjunctivae and lids unremarkable. Cardiovascular: Regular rhythm.  Lungs: Normal work of breathing. Neurologic: No focal deficits.   Lab Results  Component Value Date   CREATININE 0.73  12/22/2019   BUN 14 12/22/2019   NA 143 12/22/2019   K 4.9 12/22/2019   CL 106 12/22/2019   CO2 23 12/22/2019   Lab Results  Component Value Date   ALT 11 12/22/2019   AST 13 12/22/2019   ALKPHOS 54 12/22/2019   BILITOT 0.3 12/22/2019   Lab Results  Component Value Date   HGBA1C  4.7 (L) 12/22/2019   HGBA1C 5.0 10/16/2018   Lab Results  Component Value Date   INSULIN 6.0 05/27/2020   INSULIN 6.5 12/22/2019   INSULIN 3.6 10/16/2018   Lab Results  Component Value Date   TSH 3.890 12/22/2019   Lab Results  Component Value Date   CHOL 158 12/22/2019   HDL 51 12/22/2019   LDLCALC 96 12/22/2019   TRIG 54 12/22/2019   Lab Results  Component Value Date   VD25OH 55.4 05/27/2020   VD25OH 32.0 12/22/2019   VD25OH 46.9 05/05/2019   Lab Results  Component Value Date   WBC 6.4 12/22/2019   HGB 13.6 12/22/2019   HCT 40.2 12/22/2019   MCV 94 12/22/2019   PLT 242 12/22/2019   No results found for: IRON, TIBC, FERRITIN  Attestation Statements:   Reviewed by clinician on day of visit: allergies, medications, problem list, medical history, surgical history, family history, social history, and previous encounter notes.   Wilhemena Durie, am acting as transcriptionist for [employer], PA-C.  I have reviewed the above documentation for accuracy and completeness, and I agree with the above. Abby Potash, PA-C

## 2021-06-28 ENCOUNTER — Encounter (INDEPENDENT_AMBULATORY_CARE_PROVIDER_SITE_OTHER): Payer: Self-pay

## 2021-06-29 ENCOUNTER — Ambulatory Visit (INDEPENDENT_AMBULATORY_CARE_PROVIDER_SITE_OTHER): Payer: 59 | Admitting: Family Medicine

## 2021-06-29 ENCOUNTER — Ambulatory Visit (INDEPENDENT_AMBULATORY_CARE_PROVIDER_SITE_OTHER): Payer: 59 | Admitting: Physician Assistant

## 2021-06-29 ENCOUNTER — Other Ambulatory Visit: Payer: Self-pay

## 2021-06-29 ENCOUNTER — Other Ambulatory Visit (HOSPITAL_COMMUNITY): Payer: Self-pay

## 2021-06-29 ENCOUNTER — Encounter (INDEPENDENT_AMBULATORY_CARE_PROVIDER_SITE_OTHER): Payer: Self-pay | Admitting: Family Medicine

## 2021-06-29 VITALS — BP 109/74 | HR 71 | Temp 97.5°F | Ht 64.0 in | Wt 194.0 lb

## 2021-06-29 DIAGNOSIS — F3289 Other specified depressive episodes: Secondary | ICD-10-CM

## 2021-06-29 DIAGNOSIS — E8881 Metabolic syndrome: Secondary | ICD-10-CM

## 2021-06-29 DIAGNOSIS — E669 Obesity, unspecified: Secondary | ICD-10-CM | POA: Diagnosis not present

## 2021-06-29 DIAGNOSIS — Z6832 Body mass index (BMI) 32.0-32.9, adult: Secondary | ICD-10-CM | POA: Diagnosis not present

## 2021-06-29 DIAGNOSIS — Z9189 Other specified personal risk factors, not elsewhere classified: Secondary | ICD-10-CM | POA: Diagnosis not present

## 2021-06-29 DIAGNOSIS — E88819 Insulin resistance, unspecified: Secondary | ICD-10-CM

## 2021-06-29 MED ORDER — OZEMPIC (0.25 OR 0.5 MG/DOSE) 2 MG/1.5ML ~~LOC~~ SOPN
0.2500 mg | PEN_INJECTOR | SUBCUTANEOUS | 0 refills | Status: DC
  Filled 2021-06-29: qty 1.5, 56d supply, fill #0

## 2021-06-29 MED ORDER — BUPROPION HCL ER (SR) 150 MG PO TB12
ORAL_TABLET | Freq: Two times a day (BID) | ORAL | 0 refills | Status: DC
  Filled 2021-06-29: qty 60, 30d supply, fill #0

## 2021-06-30 ENCOUNTER — Telehealth (INDEPENDENT_AMBULATORY_CARE_PROVIDER_SITE_OTHER): Payer: Self-pay

## 2021-06-30 ENCOUNTER — Encounter (INDEPENDENT_AMBULATORY_CARE_PROVIDER_SITE_OTHER): Payer: Self-pay

## 2021-06-30 NOTE — Telephone Encounter (Signed)
Prior auth needed for [employer]

## 2021-06-30 NOTE — Progress Notes (Signed)
Chief Complaint:   OBESITY [name] is here to discuss her progress with her obesity treatment plan along with follow-up of her obesity related diagnoses. [name] is on the Category 2 Plan and states she is following her eating plan approximately 80% of the time. [name] states she is walking and weight training 30-60 minutes 5 times per week.  Today's visit was #: 33 Starting weight: 191 lbs Starting date: 10/16/2018 Today's weight: 194 lbs Today's date: 06/29/2021 Total lbs lost to date: 0 Total lbs lost since last in-office visit: 0  Interim History: [name] started at our clinic in November of 2018 at 191 lbs. She lost down to 175 lbs over the course of the next 18 months and then began regaining. [name] had been on Wegovy but discontinued it in March 2022 due to not feeling well. However she did lose some weight while taking it. She tolerated the 0.25 mg dose but then had GI issues when dose was increased to 0.50 mg.  She feels she does not do well on plan in social situations. She does plan what to eat before these activities but then goes off plan. She tends to have a lot of social activities in the summer.  Subjective:   1. Insulin resistance [name] notes eating sometimes when she is not hungry. She did lose weight on Wegovy but did not tolerate 0.5 mg dose due to nausea.  2. Other depression, with emotional eating [name] does not feel that hunger is a big issue for her. She plans to eat well at social functions but then goes off plan.  3. At risk for nausea [name] is at risk for nausea due to starting Ozempic.  Assessment/Plan:   1. Insulin resistance [name] will start with 0.125 mg weekly and then increase to 0.25 mg weekly if no nausea develops.  Refill- Semaglutide,0.25 or 0.'5MG'$ /DOS, (OZEMPIC, 0.25 OR 0.5 MG/DOSE,) 2 MG/1.5ML SOPN; Inject 0.25 mg into the skin once a week.  Dispense: 1.5 mL; Refill: 0  2. Other depression, with emotional eating [name]'s mood  is stable on bupropion but she still struggles with emotional eating (eating when she is not hungry). Refill- buPROPion (WELLBUTRIN SR) 150 MG 12 hr tablet; TAKE 1 TABLET BY MOUTH TWICE DAILY  Dispense: 60 tablet; Refill: 0  3. At risk for nausea [name] FOLDEN was given approximately 15 minutes of nausea prevention counseling today. [name] is at risk for nausea due to her new or current medication. She was encouraged to titrate her medication slowly, make sure to stay hydrated, eat smaller portions throughout the day, and avoid high fat meals.    4. Obesity: Current BMI 33.28  [name] is currently in the action stage of change. As such, her goal is to continue with weight loss efforts. She has agreed to the Category 2 Plan.   Exercise goals:  As is  Behavioral modification strategies: planning for success.  [name] has agreed to follow-up with our clinic in 4 weeks with [facility]  Objective:   Blood pressure 109/74, pulse 71, temperature (!) 97.5 F (36.4 C), height '5\' 4"'$  (1.626 m), weight 194 lb (88 kg), SpO2 98 %. Body mass index is 33.3 kg/m.  General: Cooperative, alert, well developed, in no acute distress. HEENT: Conjunctivae and lids unremarkable. Cardiovascular: Regular rhythm.  Lungs: Normal work of breathing. Neurologic: No focal deficits.   Lab Results  Component Value Date   CREATININE 0.73 12/22/2019   BUN 14 12/22/2019   NA 143 12/22/2019  K 4.9 12/22/2019   CL 106 12/22/2019   CO2 23 12/22/2019   Lab Results  Component Value Date   ALT 11 12/22/2019   AST 13 12/22/2019   ALKPHOS 54 12/22/2019   BILITOT 0.3 12/22/2019   Lab Results  Component Value Date   HGBA1C 4.7 (L) 12/22/2019   HGBA1C 5.0 10/16/2018   Lab Results  Component Value Date   INSULIN 6.0 05/27/2020   INSULIN 6.5 12/22/2019   INSULIN 3.6 10/16/2018   Lab Results  Component Value Date   TSH 3.890 12/22/2019   Lab Results  Component Value Date   CHOL 158 12/22/2019   HDL 51  12/22/2019   LDLCALC 96 12/22/2019   TRIG 54 12/22/2019   Lab Results  Component Value Date   VD25OH 55.4 05/27/2020   VD25OH 32.0 12/22/2019   VD25OH 46.9 05/05/2019   Lab Results  Component Value Date   WBC 6.4 12/22/2019   HGB 13.6 12/22/2019   HCT 40.2 12/22/2019   MCV 94 12/22/2019   PLT 242 12/22/2019   Attestation Statements:   Reviewed by clinician on day of visit: allergies, medications, problem list, medical history, surgical history, family history, social history, and previous encounter notes.  Coral Ceo, CMA, am acting as Location manager for [employer], [place].  I have reviewed the above documentation for accuracy and completeness, and I agree with the above. -  Georgianne Fick, FNP

## 2021-06-30 NOTE — Telephone Encounter (Signed)
Prior authorization not required for Ozempic. Patient notified via mychart.

## 2021-07-11 ENCOUNTER — Encounter (INDEPENDENT_AMBULATORY_CARE_PROVIDER_SITE_OTHER): Payer: Self-pay

## 2021-07-15 ENCOUNTER — Other Ambulatory Visit (HOSPITAL_COMMUNITY): Payer: Self-pay

## 2021-07-15 MED FILL — Lifitegrast Ophth Soln 5%: OPHTHALMIC | 90 days supply | Qty: 180 | Fill #0 | Status: AC

## 2021-07-25 ENCOUNTER — Ambulatory Visit (INDEPENDENT_AMBULATORY_CARE_PROVIDER_SITE_OTHER): Payer: 59 | Admitting: Physician Assistant

## 2021-08-10 ENCOUNTER — Other Ambulatory Visit (HOSPITAL_COMMUNITY): Payer: Self-pay

## 2021-08-10 ENCOUNTER — Ambulatory Visit (INDEPENDENT_AMBULATORY_CARE_PROVIDER_SITE_OTHER): Payer: 59 | Admitting: Physician Assistant

## 2021-08-10 ENCOUNTER — Other Ambulatory Visit: Payer: Self-pay

## 2021-08-10 ENCOUNTER — Encounter (INDEPENDENT_AMBULATORY_CARE_PROVIDER_SITE_OTHER): Payer: Self-pay | Admitting: Physician Assistant

## 2021-08-10 VITALS — BP 109/71 | HR 81 | Temp 98.0°F | Ht 64.0 in | Wt 193.0 lb

## 2021-08-10 DIAGNOSIS — F3289 Other specified depressive episodes: Secondary | ICD-10-CM | POA: Diagnosis not present

## 2021-08-10 DIAGNOSIS — Z9189 Other specified personal risk factors, not elsewhere classified: Secondary | ICD-10-CM

## 2021-08-10 DIAGNOSIS — E8881 Metabolic syndrome: Secondary | ICD-10-CM

## 2021-08-10 DIAGNOSIS — Z6832 Body mass index (BMI) 32.0-32.9, adult: Secondary | ICD-10-CM | POA: Diagnosis not present

## 2021-08-10 DIAGNOSIS — E669 Obesity, unspecified: Secondary | ICD-10-CM | POA: Diagnosis not present

## 2021-08-10 MED ORDER — BUPROPION HCL ER (SR) 150 MG PO TB12
ORAL_TABLET | Freq: Two times a day (BID) | ORAL | 0 refills | Status: DC
  Filled 2021-08-10 – 2021-08-23 (×2): qty 60, 30d supply, fill #0

## 2021-08-10 MED ORDER — OZEMPIC (0.25 OR 0.5 MG/DOSE) 2 MG/1.5ML ~~LOC~~ SOPN
0.5000 mg | PEN_INJECTOR | SUBCUTANEOUS | 0 refills | Status: DC
  Filled 2021-08-10 – 2021-08-23 (×2): qty 1.5, 28d supply, fill #0

## 2021-08-10 NOTE — Progress Notes (Signed)
Chief Complaint:   OBESITY [name] is here to discuss her progress with her obesity treatment plan along with follow-up of her obesity related diagnoses. [name] is on the Category 2 Plan and states she is following her eating plan approximately 85% of the time. [name] states she is walking/lifting weights for 30-60 minutes 5-6 times per week.  Today's visit was #: 54 Starting weight: 191 lbs Starting date: 10/16/2018 Today's weight: 193 lbs Today's date: 08/10/2021 Total lbs lost to date: 0 Total lbs lost since last in-office visit: 1 lb  Interim History: [name] reports that the last few weeks have been up and down due to her husband having some medical issues.  She has been doing some stress eating.  She has been journaling and feels like that is working for her.  Subjective:   1. Insulin resistance Bertine is on Ozempic 0.25 mg subcutaneously weekly and is tolerating it well.  Lab Results  Component Value Date   INSULIN 6.0 05/27/2020   INSULIN 6.5 12/22/2019   INSULIN 3.6 10/16/2018   Lab Results  Component Value Date   HGBA1C 4.7 (L) 12/22/2019   2. Other depression, with emotional eating She feels like Wellbutrin helps with cravings.  3. At risk for diabetes mellitus [name] is at higher than average risk for developing diabetes due to obesity.   Assessment/Plan:   1. Insulin resistance Increase Ozempic to 0.5 mg subcutaneously weekly, as per below.  - Increase and refill Semaglutide,0.25 or 0.'5MG'$ /DOS, (OZEMPIC, 0.25 OR 0.5 MG/DOSE,) 2 MG/1.5ML SOPN; Inject 0.5 mg into the skin once a week.  Dispense: 1.5 mL; Refill: 0  2. Other depression, with emotional eating Refill Wellbutrin 150 mg twice daily.  - Refill buPROPion (WELLBUTRIN SR) 150 MG 12 hr tablet; TAKE 1 TABLET BY MOUTH TWICE DAILY  Dispense: 60 tablet; Refill: 0  3. At risk for diabetes mellitus [name] was given approximately 15 minutes of diabetes education and counseling today. We discussed  intensive lifestyle modifications today with an emphasis on weight loss as well as increasing exercise and decreasing simple carbohydrates in her diet. We also reviewed medication options with an emphasis on risk versus benefit of those discussed.   Repetitive spaced learning was employed today to elicit superior memory formation and behavioral change.   4. Obesity: Currrent BMI 33.2  [name] is currently in the action stage of change. As such, her goal is to continue with weight loss efforts. She has agreed to keeping a food journal and adhering to recommended goals of 1200-1300 calories and 85 grams of protein.   Exercise goals:  As is.  Behavioral modification strategies: meal planning and cooking strategies and keeping healthy foods in the home.  [name] has agreed to follow-up with our clinic in 3-4 weeks. She was informed of the importance of frequent follow-up visits to maximize her success with intensive lifestyle modifications for her multiple health conditions.   Objective:   Blood pressure 109/71, pulse 81, temperature 98 F (36.7 C), height '5\' 4"'$  (1.626 m), weight 193 lb (87.5 kg), SpO2 96 %. Body mass index is 33.13 kg/m.  General: Cooperative, alert, well developed, in no acute distress. HEENT: Conjunctivae and lids unremarkable. Cardiovascular: Regular rhythm.  Lungs: Normal work of breathing. Neurologic: No focal deficits.   Lab Results  Component Value Date   CREATININE 0.73 12/22/2019   BUN 14 12/22/2019   NA 143 12/22/2019   K 4.9 12/22/2019   CL 106 12/22/2019   CO2 23 12/22/2019  Lab Results  Component Value Date   ALT 11 12/22/2019   AST 13 12/22/2019   ALKPHOS 54 12/22/2019   BILITOT 0.3 12/22/2019   Lab Results  Component Value Date   HGBA1C 4.7 (L) 12/22/2019   HGBA1C 5.0 10/16/2018   Lab Results  Component Value Date   INSULIN 6.0 05/27/2020   INSULIN 6.5 12/22/2019   INSULIN 3.6 10/16/2018   Lab Results  Component Value Date   TSH  3.890 12/22/2019   Lab Results  Component Value Date   CHOL 158 12/22/2019   HDL 51 12/22/2019   LDLCALC 96 12/22/2019   TRIG 54 12/22/2019   Lab Results  Component Value Date   VD25OH 55.4 05/27/2020   VD25OH 32.0 12/22/2019   VD25OH 46.9 05/05/2019   Lab Results  Component Value Date   WBC 6.4 12/22/2019   HGB 13.6 12/22/2019   HCT 40.2 12/22/2019   MCV 94 12/22/2019   PLT 242 12/22/2019   Attestation Statements:   Reviewed by clinician on day of visit: allergies, medications, problem list, medical history, surgical history, family history, social history, and previous encounter notes.  I, Water quality scientist, CMA, am acting as transcriptionist for Abby Potash, PA-C  I have reviewed the above documentation for accuracy and completeness, and I agree with the above. Abby Potash, PA-C

## 2021-08-12 ENCOUNTER — Other Ambulatory Visit (HOSPITAL_COMMUNITY): Payer: Self-pay

## 2021-08-22 ENCOUNTER — Other Ambulatory Visit (HOSPITAL_COMMUNITY): Payer: Self-pay

## 2021-08-23 ENCOUNTER — Other Ambulatory Visit (HOSPITAL_COMMUNITY): Payer: Self-pay

## 2021-08-30 ENCOUNTER — Other Ambulatory Visit (HOSPITAL_COMMUNITY): Payer: Self-pay

## 2021-08-30 MED ORDER — CARESTART COVID-19 HOME TEST VI KIT
PACK | 0 refills | Status: DC
  Filled 2021-08-30: qty 4, 4d supply, fill #0

## 2021-08-31 ENCOUNTER — Encounter (INDEPENDENT_AMBULATORY_CARE_PROVIDER_SITE_OTHER): Payer: Self-pay | Admitting: Physician Assistant

## 2021-08-31 ENCOUNTER — Other Ambulatory Visit: Payer: Self-pay

## 2021-08-31 ENCOUNTER — Ambulatory Visit (INDEPENDENT_AMBULATORY_CARE_PROVIDER_SITE_OTHER): Payer: 59 | Admitting: Physician Assistant

## 2021-08-31 ENCOUNTER — Other Ambulatory Visit (HOSPITAL_COMMUNITY): Payer: Self-pay

## 2021-08-31 VITALS — BP 105/69 | HR 73 | Temp 98.0°F | Ht 64.0 in | Wt 192.0 lb

## 2021-08-31 DIAGNOSIS — Z6832 Body mass index (BMI) 32.0-32.9, adult: Secondary | ICD-10-CM | POA: Diagnosis not present

## 2021-08-31 DIAGNOSIS — Z9189 Other specified personal risk factors, not elsewhere classified: Secondary | ICD-10-CM

## 2021-08-31 DIAGNOSIS — F3289 Other specified depressive episodes: Secondary | ICD-10-CM | POA: Diagnosis not present

## 2021-08-31 DIAGNOSIS — E669 Obesity, unspecified: Secondary | ICD-10-CM

## 2021-08-31 DIAGNOSIS — E8881 Metabolic syndrome: Secondary | ICD-10-CM

## 2021-08-31 MED ORDER — BUPROPION HCL ER (SR) 150 MG PO TB12
ORAL_TABLET | Freq: Two times a day (BID) | ORAL | 0 refills | Status: DC
Start: 2021-08-31 — End: 2021-10-04
  Filled 2021-08-31 – 2021-09-28 (×2): qty 60, 30d supply, fill #0

## 2021-08-31 MED ORDER — SEMAGLUTIDE (1 MG/DOSE) 4 MG/3ML ~~LOC~~ SOPN
1.0000 mg | PEN_INJECTOR | SUBCUTANEOUS | 0 refills | Status: DC
  Filled 2021-08-31: qty 3, 28d supply, fill #0

## 2021-08-31 NOTE — Progress Notes (Signed)
Chief Complaint:   OBESITY [name] is here to discuss her progress with her obesity treatment plan along with follow-up of her obesity related diagnoses. [name] is on keeping a food journal and adhering to recommended goals of 1200-1300 calories and 85 grams of protein daily and states she is following her eating plan approximately 80-85% of the time. [name] states she is walking for 30-60 minutes 4-5 times per week.  Today's visit was #: 76 Starting weight: 191 lbs Starting date: 10/16/2018 Today's weight: 192 lbs Today's date: 08/31/2021 Total lbs lost to date: 0 Total lbs lost since last in-office visit: 1  Interim History: [name] is trying to increase her protein when needed to make up for other meals. She is being cognizant of portion sizes and making better choices. She is not journaling consistently.  Subjective:   1. Insulin resistance [name] is on Ozempic 0.5 mg, and she finds herself more "gassy" at times.  2. Other depression, with emotional eating [name] is taking bupropion BID, and she notes it helps with cravings.  3. At risk for side effect of medication [name] is at risk for drug side effects due to increased dose of Ozempic.  Assessment/Plan:   1. Insulin resistance Garnell agreed to increase Ozempic to 1 mg q weekly with no refills. She will continue to work on weight loss, exercise, and decreasing simple carbohydrates to help decrease the risk of diabetes. [name] agreed to follow-up with [place] as directed to closely monitor her progress.  - Semaglutide, 1 MG/DOSE, 4 MG/3ML SOPN; Inject 1 mg as directed once a week.  Dispense: 3 mL; Refill: 0  2. Other depression, with emotional eating Behavior modification techniques were discussed today to help [name] deal with her emotional/non-hunger eating behaviors. We will refill Wellbutrin SR for 1 month. Orders and follow up as documented in patient record.   - buPROPion (WELLBUTRIN SR) 150 MG 12 hr tablet;  TAKE 1 TABLET BY MOUTH TWICE DAILY  Dispense: 60 tablet; Refill: 0  3. At risk for side effect of medication [name] was given approximately 15 minutes of drug side effect counseling today.  We discussed side effect possibility and risk versus benefits. [name] agreed to the medication and will contact this office if these side effects are intolerable.  Repetitive spaced learning was employed today to elicit superior memory formation and behavioral change.  4. Obesity: Currrent BMI 32.94 [name] is currently in the action stage of change. As such, her goal is to continue with weight loss efforts. She has agreed to keeping a food journal and adhering to recommended goals of 1200-1300 calories and 85 grams of protein daily.   Exercise goals: As is.  Behavioral modification strategies: planning for success and keeping a strict food journal.  [name] has agreed to follow-up with our clinic in 3 weeks. She was informed of the importance of frequent follow-up visits to maximize her success with intensive lifestyle modifications for her multiple health conditions.   Objective:   Blood pressure 105/69, pulse 73, temperature 98 F (36.7 C), height 5\' 4"  (1.626 m), weight 192 lb (87.1 kg), SpO2 96 %. Body mass index is 32.96 kg/m.  General: Cooperative, alert, well developed, in no acute distress. HEENT: Conjunctivae and lids unremarkable. Cardiovascular: Regular rhythm.  Lungs: Normal work of breathing. Neurologic: No focal deficits.   Lab Results  Component Value Date   CREATININE 0.73 12/22/2019   BUN 14 12/22/2019   NA 143 12/22/2019   K 4.9 12/22/2019  CL 106 12/22/2019   CO2 23 12/22/2019   Lab Results  Component Value Date   ALT 11 12/22/2019   AST 13 12/22/2019   ALKPHOS 54 12/22/2019   BILITOT 0.3 12/22/2019   Lab Results  Component Value Date   HGBA1C 4.7 (L) 12/22/2019   HGBA1C 5.0 10/16/2018   Lab Results  Component Value Date   INSULIN 6.0 05/27/2020   INSULIN  6.5 12/22/2019   INSULIN 3.6 10/16/2018   Lab Results  Component Value Date   TSH 3.890 12/22/2019   Lab Results  Component Value Date   CHOL 158 12/22/2019   HDL 51 12/22/2019   LDLCALC 96 12/22/2019   TRIG 54 12/22/2019   Lab Results  Component Value Date   VD25OH 55.4 05/27/2020   VD25OH 32.0 12/22/2019   VD25OH 46.9 05/05/2019   Lab Results  Component Value Date   WBC 6.4 12/22/2019   HGB 13.6 12/22/2019   HCT 40.2 12/22/2019   MCV 94 12/22/2019   PLT 242 12/22/2019   No results found for: IRON, TIBC, FERRITIN  Attestation Statements:   Reviewed by clinician on day of visit: allergies, medications, problem list, medical history, surgical history, family history, social history, and previous encounter notes.   Wilhemena Durie, am acting as transcriptionist for [employer], PA-C.  I have reviewed the above documentation for accuracy and completeness, and I agree with the above. Abby Potash, PA-C

## 2021-09-20 ENCOUNTER — Encounter (INDEPENDENT_AMBULATORY_CARE_PROVIDER_SITE_OTHER): Payer: Self-pay

## 2021-09-21 ENCOUNTER — Ambulatory Visit (INDEPENDENT_AMBULATORY_CARE_PROVIDER_SITE_OTHER): Payer: 59 | Admitting: Physician Assistant

## 2021-09-21 DIAGNOSIS — Z01419 Encounter for gynecological examination (general) (routine) without abnormal findings: Secondary | ICD-10-CM | POA: Diagnosis not present

## 2021-09-21 DIAGNOSIS — Z9889 Other specified postprocedural states: Secondary | ICD-10-CM | POA: Diagnosis not present

## 2021-09-28 ENCOUNTER — Other Ambulatory Visit (HOSPITAL_COMMUNITY): Payer: Self-pay

## 2021-09-28 MED ORDER — VALACYCLOVIR HCL 1 G PO TABS
ORAL_TABLET | ORAL | 2 refills | Status: AC
  Filled 2021-09-28: qty 20, 5d supply, fill #0
  Filled 2021-11-07: qty 20, 5d supply, fill #1

## 2021-09-29 ENCOUNTER — Ambulatory Visit (INDEPENDENT_AMBULATORY_CARE_PROVIDER_SITE_OTHER): Payer: 59 | Admitting: Family Medicine

## 2021-10-04 ENCOUNTER — Other Ambulatory Visit (HOSPITAL_COMMUNITY): Payer: Self-pay

## 2021-10-04 ENCOUNTER — Ambulatory Visit (INDEPENDENT_AMBULATORY_CARE_PROVIDER_SITE_OTHER): Payer: 59 | Admitting: Physician Assistant

## 2021-10-04 ENCOUNTER — Encounter (INDEPENDENT_AMBULATORY_CARE_PROVIDER_SITE_OTHER): Payer: Self-pay | Admitting: Physician Assistant

## 2021-10-04 ENCOUNTER — Other Ambulatory Visit: Payer: Self-pay

## 2021-10-04 VITALS — BP 98/67 | HR 70 | Temp 98.0°F | Ht 64.0 in | Wt 191.0 lb

## 2021-10-04 DIAGNOSIS — F3289 Other specified depressive episodes: Secondary | ICD-10-CM | POA: Diagnosis not present

## 2021-10-04 DIAGNOSIS — Z6832 Body mass index (BMI) 32.0-32.9, adult: Secondary | ICD-10-CM | POA: Diagnosis not present

## 2021-10-04 DIAGNOSIS — E559 Vitamin D deficiency, unspecified: Secondary | ICD-10-CM

## 2021-10-04 DIAGNOSIS — D649 Anemia, unspecified: Secondary | ICD-10-CM | POA: Diagnosis not present

## 2021-10-04 DIAGNOSIS — E669 Obesity, unspecified: Secondary | ICD-10-CM | POA: Diagnosis not present

## 2021-10-04 DIAGNOSIS — Z9189 Other specified personal risk factors, not elsewhere classified: Secondary | ICD-10-CM

## 2021-10-04 DIAGNOSIS — Z13220 Encounter for screening for lipoid disorders: Secondary | ICD-10-CM | POA: Diagnosis not present

## 2021-10-04 DIAGNOSIS — Z0000 Encounter for general adult medical examination without abnormal findings: Secondary | ICD-10-CM | POA: Diagnosis not present

## 2021-10-04 DIAGNOSIS — E8881 Metabolic syndrome: Secondary | ICD-10-CM | POA: Diagnosis not present

## 2021-10-04 LAB — HEMOGLOBIN A1C: Hemoglobin A1C: 4.7

## 2021-10-04 MED ORDER — BUPROPION HCL ER (SR) 150 MG PO TB12
ORAL_TABLET | Freq: Two times a day (BID) | ORAL | 0 refills | Status: DC
  Filled 2021-10-04: qty 60, fill #0

## 2021-10-04 NOTE — Progress Notes (Signed)
Chief Complaint:   OBESITY [name] is here to discuss her progress with her obesity treatment plan along with follow-up of her obesity related diagnoses. [name] is on keeping a food journal and adhering to recommended goals of 1200-1300 calories and 85 grams of protein daily and states she is following her eating plan approximately 80% of the time. [name] states she is walking 3-4 miles for 30-60 minutes 3 times per week.  Today's visit was #: 17 Starting weight: 191 lbs Starting date: 10/16/2018 Today's weight: 191 lbs Today's date: 10/04/2021 Total lbs lost to date: 0 Total lbs lost since last in-office visit: 1  Interim History: Kaytelyn reports that the last few weeks have been "crazy". She has been traveling every weekend with her son's sports. Her husband's surgery is next week. She notes increasing to 1 mg of Ozempic has decreased her appetite and cravings. She is not journaling on the weekends, but she will journaling more often now that things have calmed down.  Subjective:   1. Vitamin D deficiency [name]'s last Vit D level was 55.4. She is on Vit D 5,000 units every other day.  2. Other depression, with emotional eating [name] feels bupropion helps with her cravings. Her mood is stable and her blood pressure is controlled.  3. At risk for osteoporosis [name] is at higher risk of osteopenia and osteoporosis due to Vitamin D deficiency.   Assessment/Plan:   1. Vitamin D deficiency Low Vitamin D level contributes to fatigue and are associated with obesity, breast, and colon cancer. [name] will continue Vitamin D 5,000 IU every other day, and we will monitor her levels periodically. She will follow-up for routine testing of Vitamin D, at least 2-3 times per year to avoid over-replacement.  2. Other depression, with emotional eating Behavior modification techniques were discussed today to help [name] deal with her emotional/non-hunger eating behaviors. We will  refill bupropion for 1 month. Orders and follow up as documented in patient record.   - buPROPion (WELLBUTRIN SR) 150 MG 12 hr tablet; TAKE 1 TABLET BY MOUTH TWICE DAILY  Dispense: 60 tablet; Refill: 0  3. At risk for osteoporosis [name] was given approximately 15 minutes of osteoporosis prevention counseling today. [name] is at risk for osteopenia and osteoporosis due to her Vitamin D deficiency. She was encouraged to take her Vitamin D and follow her higher calcium diet and increase strengthening exercise to help strengthen her bones and decrease her risk of osteopenia and osteoporosis.  Repetitive spaced learning was employed today to elicit superior memory formation and behavioral change.  4. Obesity: Currrent BMI 32.77 [name] is currently in the action stage of change. As such, her goal is to continue with weight loss efforts. She has agreed to keeping a food journal and adhering to recommended goals of 1200-1300 calories and 85 grams of protein daily.   Exercise goals: As is.  Behavioral modification strategies: decreasing eating out and keeping a strict food journal.  [name] has agreed to follow-up with our clinic in 3 to 4 weeks. She was informed of the importance of frequent follow-up visits to maximize her success with intensive lifestyle modifications for her multiple health conditions.   Objective:   Blood pressure 98/67, pulse 70, temperature 98 F (36.7 C), height 5\' 4"  (1.626 m), weight 191 lb (86.6 kg), SpO2 98 %. Body mass index is 32.79 kg/m.  General: Cooperative, alert, well developed, in no acute distress. HEENT: Conjunctivae and lids unremarkable. Cardiovascular: Regular rhythm.  Lungs: Normal  work of breathing. Neurologic: No focal deficits.   Lab Results  Component Value Date   CREATININE 0.73 12/22/2019   BUN 14 12/22/2019   NA 143 12/22/2019   K 4.9 12/22/2019   CL 106 12/22/2019   CO2 23 12/22/2019   Lab Results  Component Value Date   ALT 11  12/22/2019   AST 13 12/22/2019   ALKPHOS 54 12/22/2019   BILITOT 0.3 12/22/2019   Lab Results  Component Value Date   HGBA1C 4.7 (L) 12/22/2019   HGBA1C 5.0 10/16/2018   Lab Results  Component Value Date   INSULIN 6.0 05/27/2020   INSULIN 6.5 12/22/2019   INSULIN 3.6 10/16/2018   Lab Results  Component Value Date   TSH 3.890 12/22/2019   Lab Results  Component Value Date   CHOL 158 12/22/2019   HDL 51 12/22/2019   LDLCALC 96 12/22/2019   TRIG 54 12/22/2019   Lab Results  Component Value Date   VD25OH 55.4 05/27/2020   VD25OH 32.0 12/22/2019   VD25OH 46.9 05/05/2019   Lab Results  Component Value Date   WBC 6.4 12/22/2019   HGB 13.6 12/22/2019   HCT 40.2 12/22/2019   MCV 94 12/22/2019   PLT 242 12/22/2019   No results found for: IRON, TIBC, FERRITIN  Attestation Statements:   Reviewed by clinician on day of visit: allergies, medications, problem list, medical history, surgical history, family history, social history, and previous encounter notes.   Wilhemena Durie, am acting as transcriptionist for [employer], PA-C.  I have reviewed the above documentation for accuracy and completeness, and I agree with the above. Abby Potash, PA-C

## 2021-10-05 ENCOUNTER — Other Ambulatory Visit (INDEPENDENT_AMBULATORY_CARE_PROVIDER_SITE_OTHER): Payer: Self-pay | Admitting: Emergency Medicine

## 2021-10-05 ENCOUNTER — Encounter (INDEPENDENT_AMBULATORY_CARE_PROVIDER_SITE_OTHER): Payer: Self-pay | Admitting: Physician Assistant

## 2021-10-06 ENCOUNTER — Encounter (INDEPENDENT_AMBULATORY_CARE_PROVIDER_SITE_OTHER): Payer: Self-pay | Admitting: Physician Assistant

## 2021-10-31 ENCOUNTER — Encounter (INDEPENDENT_AMBULATORY_CARE_PROVIDER_SITE_OTHER): Payer: Self-pay | Admitting: Physician Assistant

## 2021-11-07 ENCOUNTER — Other Ambulatory Visit (HOSPITAL_COMMUNITY): Payer: Self-pay

## 2021-11-07 ENCOUNTER — Other Ambulatory Visit (INDEPENDENT_AMBULATORY_CARE_PROVIDER_SITE_OTHER): Payer: Self-pay | Admitting: Emergency Medicine

## 2021-11-07 DIAGNOSIS — F3289 Other specified depressive episodes: Secondary | ICD-10-CM

## 2021-11-07 MED ORDER — BUPROPION HCL ER (SR) 150 MG PO TB12
150.0000 mg | ORAL_TABLET | Freq: Two times a day (BID) | ORAL | 0 refills | Status: DC
  Filled 2021-11-07: qty 180, 90d supply, fill #0

## 2021-11-07 MED FILL — Lifitegrast Ophth Soln 5%: OPHTHALMIC | 90 days supply | Qty: 180 | Fill #1 | Status: AC

## 2021-11-07 NOTE — Telephone Encounter (Signed)
Ok to send 90 days. thanks

## 2021-11-07 NOTE — Telephone Encounter (Signed)
90 day supply of Bupropion has been sent to the pharmacy

## 2021-11-15 ENCOUNTER — Other Ambulatory Visit: Payer: Self-pay

## 2021-11-15 ENCOUNTER — Ambulatory Visit (INDEPENDENT_AMBULATORY_CARE_PROVIDER_SITE_OTHER): Payer: 59 | Admitting: Physician Assistant

## 2021-11-15 ENCOUNTER — Encounter (INDEPENDENT_AMBULATORY_CARE_PROVIDER_SITE_OTHER): Payer: Self-pay | Admitting: Physician Assistant

## 2021-11-15 VITALS — BP 98/61 | HR 68 | Temp 97.9°F | Ht 64.0 in | Wt 190.0 lb

## 2021-11-15 DIAGNOSIS — E8881 Metabolic syndrome: Secondary | ICD-10-CM

## 2021-11-15 DIAGNOSIS — E669 Obesity, unspecified: Secondary | ICD-10-CM

## 2021-11-15 DIAGNOSIS — Z6832 Body mass index (BMI) 32.0-32.9, adult: Secondary | ICD-10-CM

## 2021-11-15 NOTE — Progress Notes (Signed)
Chief Complaint:   OBESITY [name] is here to discuss her progress with her obesity treatment plan along with follow-up of her obesity related diagnoses. [name] is on keeping a food journal and adhering to recommended goals of 1200-1300 calories and 85+ grams protein and states she is following her eating plan approximately 90% of the time. [name] states she is walking and strength training 60 minutes 4 times per week.  Today's visit was #: 82 Starting weight: 191 lbs Starting date: 10/16/2018 Today's weight: 190 lbs Today's date: 11/15/2021 Total lbs lost to date: 1 Total lbs lost since last in-office visit: 1  Interim History: [name] reports having a busy 2 weeks. She had a lot of nausea after taking Ozempic 1 mg and notes she also had left sided abdominal pain. She stopped taking it a couple of weeks ago and symptoms have resolved.   Subjective:   1. Insulin resistance [name] stopped Ozempic 1 mg after nausea and left sided abdominal pain. Her appetite is controlled off of it.  Assessment/Plan:   1. Insulin resistance [name] will continue to work on weight loss, exercise, and decreasing simple carbohydrates to help decrease the risk of diabetes. [name] agreed to follow-up with [place] as directed to closely monitor her progress. Discontinue Ozempic.  2. Obesity: Current BMI 32.6  [name] is currently in the action stage of change. As such, her goal is to continue with weight loss efforts. She has agreed to keeping a food journal and adhering to recommended goals of 1200-1300 calories and 85+ grams protein.   Recheck IC at next visit.  Exercise goals:  As is  Behavioral modification strategies: decreasing simple carbohydrates, planning for success, and keeping a strict food journal.  [name] has agreed to follow-up with our clinic in 4 weeks. She was informed of the importance of frequent follow-up visits to maximize her success with intensive lifestyle modifications for  her multiple health conditions.   Objective:   Blood pressure 98/61, pulse 68, temperature 97.9 F (36.6 C), height 5\' 4"  (1.626 m), weight 190 lb (86.2 kg), SpO2 99 %. Body mass index is 32.61 kg/m.  General: Cooperative, alert, well developed, in no acute distress. HEENT: Conjunctivae and lids unremarkable. Cardiovascular: Regular rhythm.  Lungs: Normal work of breathing. Neurologic: No focal deficits.   Lab Results  Component Value Date   CREATININE 0.73 12/22/2019   BUN 14 12/22/2019   NA 143 12/22/2019   K 4.9 12/22/2019   CL 106 12/22/2019   CO2 23 12/22/2019   Lab Results  Component Value Date   ALT 11 12/22/2019   AST 13 12/22/2019   ALKPHOS 54 12/22/2019   BILITOT 0.3 12/22/2019   Lab Results  Component Value Date   HGBA1C 4.7 10/04/2021   HGBA1C 4.7 (L) 12/22/2019   HGBA1C 5.0 10/16/2018   Lab Results  Component Value Date   INSULIN 6.0 05/27/2020   INSULIN 6.5 12/22/2019   INSULIN 3.6 10/16/2018   Lab Results  Component Value Date   TSH 3.890 12/22/2019   Lab Results  Component Value Date   CHOL 158 12/22/2019   HDL 51 12/22/2019   LDLCALC 96 12/22/2019   TRIG 54 12/22/2019   Lab Results  Component Value Date   VD25OH 55.4 05/27/2020   VD25OH 32.0 12/22/2019   VD25OH 46.9 05/05/2019   Lab Results  Component Value Date   WBC 6.4 12/22/2019   HGB 13.6 12/22/2019   HCT 40.2 12/22/2019   MCV 94 12/22/2019  PLT 242 12/22/2019    Attestation Statements:   Reviewed by clinician on day of visit: allergies, medications, problem list, medical history, surgical history, family history, social history, and previous encounter notes.  Time spent on visit including pre-visit chart review and post-visit care and charting was 30 minutes.   Coral Ceo, CMA, am acting as transcriptionist for [employer], PA-C.  I have reviewed the above documentation for accuracy and completeness, and I agree with the above. Abby Potash, PA-C

## 2021-12-15 ENCOUNTER — Encounter (INDEPENDENT_AMBULATORY_CARE_PROVIDER_SITE_OTHER): Payer: Self-pay | Admitting: Physician Assistant

## 2021-12-15 ENCOUNTER — Ambulatory Visit (INDEPENDENT_AMBULATORY_CARE_PROVIDER_SITE_OTHER): Payer: 59 | Admitting: Physician Assistant

## 2021-12-15 ENCOUNTER — Other Ambulatory Visit: Payer: Self-pay

## 2021-12-15 VITALS — BP 104/66 | HR 61 | Temp 97.8°F | Ht 64.0 in | Wt 191.0 lb

## 2021-12-15 DIAGNOSIS — E559 Vitamin D deficiency, unspecified: Secondary | ICD-10-CM | POA: Diagnosis not present

## 2021-12-15 DIAGNOSIS — R0602 Shortness of breath: Secondary | ICD-10-CM

## 2021-12-15 DIAGNOSIS — E669 Obesity, unspecified: Secondary | ICD-10-CM | POA: Diagnosis not present

## 2021-12-15 DIAGNOSIS — Z6832 Body mass index (BMI) 32.0-32.9, adult: Secondary | ICD-10-CM

## 2021-12-15 NOTE — Progress Notes (Signed)
Chief Complaint:   OBESITY [name] is here to discuss her progress with her obesity treatment plan along with follow-up of her obesity related diagnoses. [name] is on keeping a food journal and adhering to recommended goals of 1200-1300 calories and 85 plus grams of protein and states she is following her eating plan approximately 80% of the time. [name] states she is doing cardio and strength for 35 minutes 6 times per week.  Today's visit was #: 74 Starting weight: 191 lbs Starting date: 10/16/2018 Today's weight: 191 lbs Today's date: 12/15/2021 Total lbs lost to date: 0 Total lbs lost since last in-office visit: 0  Interim History: [name] is on her period today and feels bloated. She is surprised that she gained weight because she feels she did well with her food intake. She is meal prepping well on Sundays. When she journals she is averaging 1300 calories and meeting her protein goal. Her RMR shows an increase to 2059 today.   Subjective:   1. Vitamin D deficiency [name] is on 5,000 units of Vitamin D. Her last Vitamin D was 67.0 with primary care provider in 09/2021.  2. Shortness of breath on exertion [name] notes shortness of breath with exertion. She denies lightheadedness.   Assessment/Plan:   1. Vitamin D deficiency Low Vitamin D level contributes to fatigue and are associated with obesity, breast, and colon cancer. [name] agrees to continue to take OTC Vitamin D 5,000 UT [name] and she will follow-up for routine testing of Vitamin D, at least 2-3 times per year to avoid over-replacement.  2. Shortness of breath on exertion We will check IC today. [name] does feel that she gets out of breath more easily that she used to when she exercises. [name]'s shortness of breath appears to be obesity related and exercise induced. She has agreed to work on weight loss and gradually increase exercise to treat her exercise induced shortness of breath. Will continue to monitor  closely.   3. Obesity: Currrent BMI 32.77 [name] is currently in the action stage of change. As such, her goal is to continue with weight loss efforts. She has agreed to keeping a food journal and adhering to recommended goals of 1500-1600 calories and 95 grams of protein [name].   Exercise goals:  As is.  Behavioral modification strategies: meal planning and cooking strategies, planning for success, and keeping a strict food journal.  [name] has agreed to follow-up with our clinic in 4 weeks. She was informed of the importance of frequent follow-up visits to maximize her success with intensive lifestyle modifications for her multiple health conditions.   [name] was informed we would discuss her lab results at her next visit unless there is a critical issue that needs to be addressed sooner. [name] agreed to keep her next visit at the agreed upon time to discuss these results.  Objective:   Blood pressure 104/66, pulse 61, temperature 97.8 F (36.6 C), temperature source Oral, height 5\' 4"  (1.626 m), weight 191 lb (86.6 kg), last menstrual period 12/15/2021, SpO2 98 %. Body mass index is 32.79 kg/m.  General: Cooperative, alert, well developed, in no acute distress. HEENT: Conjunctivae and lids unremarkable. Cardiovascular: Regular rhythm.  Lungs: Normal work of breathing. Neurologic: No focal deficits.   Lab Results  Component Value Date   CREATININE 0.73 12/22/2019   BUN 14 12/22/2019   NA 143 12/22/2019   K 4.9 12/22/2019   CL 106 12/22/2019   CO2 23 12/22/2019   Lab Results  Component Value Date   ALT 11 12/22/2019   AST 13 12/22/2019   ALKPHOS 54 12/22/2019   BILITOT 0.3 12/22/2019   Lab Results  Component Value Date   HGBA1C 4.7 10/04/2021   HGBA1C 4.7 (L) 12/22/2019   HGBA1C 5.0 10/16/2018   Lab Results  Component Value Date   INSULIN 6.0 05/27/2020   INSULIN 6.5 12/22/2019   INSULIN 3.6 10/16/2018   Lab Results  Component Value Date   TSH 3.890  12/22/2019   Lab Results  Component Value Date   CHOL 158 12/22/2019   HDL 51 12/22/2019   LDLCALC 96 12/22/2019   TRIG 54 12/22/2019   Lab Results  Component Value Date   VD25OH 55.4 05/27/2020   VD25OH 32.0 12/22/2019   VD25OH 46.9 05/05/2019   Lab Results  Component Value Date   WBC 6.4 12/22/2019   HGB 13.6 12/22/2019   HCT 40.2 12/22/2019   MCV 94 12/22/2019   PLT 242 12/22/2019   No results found for: IRON, TIBC, FERRITIN  Attestation Statements:   Reviewed by clinician on day of visit: allergies, medications, problem list, medical history, surgical history, family history, social history, and previous encounter notes.  Time spent on visit including pre-visit chart review and post-visit care and charting was 34 minutes.   I, Tonye Pearson, am acting as Location manager for [employer], PA-C.  I have reviewed the above documentation for accuracy and completeness, and I agree with the above. Abby Potash, PA-C

## 2021-12-23 DIAGNOSIS — Z1231 Encounter for screening mammogram for malignant neoplasm of breast: Secondary | ICD-10-CM | POA: Diagnosis not present

## 2022-01-16 ENCOUNTER — Ambulatory Visit (INDEPENDENT_AMBULATORY_CARE_PROVIDER_SITE_OTHER): Payer: 59 | Admitting: Physician Assistant

## 2022-01-24 ENCOUNTER — Other Ambulatory Visit: Payer: Self-pay

## 2022-01-24 ENCOUNTER — Encounter (INDEPENDENT_AMBULATORY_CARE_PROVIDER_SITE_OTHER): Payer: Self-pay | Admitting: Physician Assistant

## 2022-01-24 ENCOUNTER — Ambulatory Visit (INDEPENDENT_AMBULATORY_CARE_PROVIDER_SITE_OTHER): Payer: 59 | Admitting: Physician Assistant

## 2022-01-24 VITALS — BP 117/78 | HR 80 | Temp 97.9°F | Ht 64.0 in | Wt 196.0 lb

## 2022-01-24 DIAGNOSIS — E669 Obesity, unspecified: Secondary | ICD-10-CM | POA: Diagnosis not present

## 2022-01-24 DIAGNOSIS — Z6833 Body mass index (BMI) 33.0-33.9, adult: Secondary | ICD-10-CM | POA: Diagnosis not present

## 2022-01-24 DIAGNOSIS — E559 Vitamin D deficiency, unspecified: Secondary | ICD-10-CM

## 2022-01-24 NOTE — Progress Notes (Signed)
Chief Complaint:   OBESITY [name] is here to discuss her progress with her obesity treatment plan along with follow-up of her obesity related diagnoses. [name] is on keeping a food journal and adhering to recommended goals of 1500-1600 calories and 95 grams of protein daily and states she is following her eating plan approximately 50% of the time. [name] states she is lifting weights and walking for 30-60 minutes 5-6 times per week.  Today's visit was #: 57 Starting weight: 191 lbs Starting date: 10/16/2018 Today's weight: 196 lbs Today's date: 01/24/2022 Total lbs lost to date: 0 Total lbs lost since last in-office visit: 0  Interim History: [name] states that she had multiple birthday celebrations and overindulged in food and drinks. She continued to exercise in that time. Her hunger was satisfied when meeting her calorie and protein goals. She restarted her meal plan yesterday.  Subjective:   1. Vitamin D deficiency [name] is taking OTC Vitamin D every other day, and she is walking outside often.   Assessment/Plan:   1. Vitamin D deficiency Low Vitamin D level contributes to fatigue and are associated with obesity, breast, and colon cancer. [name] will continue OTC Vitamin D and will follow-up for routine testing of Vitamin D, at least 2-3 times per year to avoid over-replacement.  2. Obesity: Currrent BMI 33.63 [name] is currently in the action stage of change. As such, her goal is to continue with weight loss efforts. She has agreed to keeping a food journal and adhering to recommended goals of 1500-1600 calories and 95 grams of protein daily.   Exercise goals: As is.  Behavioral modification strategies: meal planning and cooking strategies, planning for success, and keeping a strict food journal.  Amneet has agreed to follow-up with our clinic in 3 weeks. She was informed of the importance of frequent follow-up visits to maximize her success with intensive  lifestyle modifications for her multiple health conditions.   Objective:   Blood pressure 117/78, pulse 80, temperature 97.9 F (36.6 C), height 5\' 4"  (1.626 m), weight 196 lb (88.9 kg), SpO2 98 %. Body mass index is 33.64 kg/m.  General: Cooperative, alert, well developed, in no acute distress. HEENT: Conjunctivae and lids unremarkable. Cardiovascular: Regular rhythm.  Lungs: Normal work of breathing. Neurologic: No focal deficits.   Lab Results  Component Value Date   CREATININE 0.73 12/22/2019   BUN 14 12/22/2019   NA 143 12/22/2019   K 4.9 12/22/2019   CL 106 12/22/2019   CO2 23 12/22/2019   Lab Results  Component Value Date   ALT 11 12/22/2019   AST 13 12/22/2019   ALKPHOS 54 12/22/2019   BILITOT 0.3 12/22/2019   Lab Results  Component Value Date   HGBA1C 4.7 10/04/2021   HGBA1C 4.7 (L) 12/22/2019   HGBA1C 5.0 10/16/2018   Lab Results  Component Value Date   INSULIN 6.0 05/27/2020   INSULIN 6.5 12/22/2019   INSULIN 3.6 10/16/2018   Lab Results  Component Value Date   TSH 3.890 12/22/2019   Lab Results  Component Value Date   CHOL 158 12/22/2019   HDL 51 12/22/2019   LDLCALC 96 12/22/2019   TRIG 54 12/22/2019   Lab Results  Component Value Date   VD25OH 55.4 05/27/2020   VD25OH 32.0 12/22/2019   VD25OH 46.9 05/05/2019   Lab Results  Component Value Date   WBC 6.4 12/22/2019   HGB 13.6 12/22/2019   HCT 40.2 12/22/2019   MCV 94 12/22/2019  PLT 242 12/22/2019   No results found for: IRON, TIBC, FERRITIN  Attestation Statements:   Reviewed by clinician on day of visit: allergies, medications, problem list, medical history, surgical history, family history, social history, and previous encounter notes.  Time spent on visit including pre-visit chart review and post-visit care and charting was 30 minutes.    [name], am acting as transcriptionist for [employer], PA-C.  I have reviewed the above documentation for accuracy and  completeness, and I agree with the above. Sheila Potash, PA-C

## 2022-02-20 ENCOUNTER — Encounter (INDEPENDENT_AMBULATORY_CARE_PROVIDER_SITE_OTHER): Payer: Self-pay | Admitting: Physician Assistant

## 2022-02-20 ENCOUNTER — Ambulatory Visit (INDEPENDENT_AMBULATORY_CARE_PROVIDER_SITE_OTHER): Payer: 59 | Admitting: Physician Assistant

## 2022-02-20 ENCOUNTER — Other Ambulatory Visit: Payer: Self-pay

## 2022-02-20 VITALS — BP 92/61 | HR 75 | Temp 97.5°F | Ht 64.0 in | Wt 196.0 lb

## 2022-02-20 DIAGNOSIS — Z6832 Body mass index (BMI) 32.0-32.9, adult: Secondary | ICD-10-CM

## 2022-02-20 DIAGNOSIS — E669 Obesity, unspecified: Secondary | ICD-10-CM

## 2022-02-20 DIAGNOSIS — E8881 Metabolic syndrome: Secondary | ICD-10-CM

## 2022-02-20 DIAGNOSIS — F3289 Other specified depressive episodes: Secondary | ICD-10-CM

## 2022-02-20 DIAGNOSIS — Z6833 Body mass index (BMI) 33.0-33.9, adult: Secondary | ICD-10-CM | POA: Diagnosis not present

## 2022-02-20 DIAGNOSIS — E88819 Insulin resistance, unspecified: Secondary | ICD-10-CM

## 2022-02-20 MED ORDER — BUPROPION HCL ER (SR) 150 MG PO TB12: ORAL_TABLET | ORAL | 0 refills | Status: DC

## 2022-02-21 NOTE — Progress Notes (Signed)
? ? ? ?Chief Complaint:  ? ?OBESITY ?[name] is here to discuss her progress with her obesity treatment plan along with follow-up of her obesity related diagnoses. [name] is on keeping a food journal and adhering to recommended goals of 1500-1600 calories and 95 grams of protein and states she is following her eating plan approximately 75-80% of the time. [name] states she is walking and strength training for 60 minutes 3-5 times per week. ? ?Today's visit was #: 51 ?Starting weight: 191 lbs ?Starting date: 10/16/2018 ?Today's weight: 196 lbs ?Today's date: 02/20/2022 ?Total lbs lost to date: 0 ?Total lbs lost since last in-office visit: 0 ? ?Interim History: [name] reports that she has been busy with swim meets and being social on the weekend. When journaling, she is not measuring her portions. She has decreased her sweets. She wants to do a plant-based plan.  ? ?Subjective:  ? ?1. Insulin resistance ?Rebeca denies polyphagia. Her last insulin was 6.0. She is exercising regularly.  ? ?2. Other depression, with emotional eating ?[name] is only taking her bupropion once daily due to forgetting the 2nd dose.  ? ?Assessment/Plan:  ? ?1. Insulin resistance ?[name] will continue plan and she will continue to work on weight loss, exercise, and decreasing simple carbohydrates to help decrease the risk of diabetes. [name] agreed to follow-up with [place] as directed to closely monitor her progress. ? ?2. Other depression, with emotional eating ?[name] will continue bupropion. We will refill bupropion 150 mg for 1 month with no refills. Behavior modification techniques were discussed today to help [name] deal with her emotional/non-hunger eating behaviors.  Orders and follow up as documented in patient record.  ? ?- buPROPion (WELLBUTRIN SR) 150 MG 12 hr tablet; Take one tablet once daily  Dispense: 180 tablet; Refill: 0 ? ?3. Obesity: Currrent BMI 33.63 ?[name] is currently in the action stage of change. As such, her  goal is to continue with weight loss efforts. She has agreed to keeping a food journal and adhering to recommended goals of 1500-1600 calories and 95 grams of protein plant-based daily.  ? ?Exercise goals:  As is.  ? ?Behavioral modification strategies: planning for success and keeping a strict food journal. ? ?[name] has agreed to follow-up with our clinic in 3 weeks. She was informed of the importance of frequent follow-up visits to maximize her success with intensive lifestyle modifications for her multiple health conditions.  ? ?Objective:  ? ?Blood pressure 92/61, pulse 75, temperature (!) 97.5 ?F (36.4 ?C), height '5\' 4"'$  (1.626 m), weight 196 lb (88.9 kg), SpO2 98 %. ?Body mass index is 33.64 kg/m?. ? ?General: Cooperative, alert, well developed, in no acute distress. ?HEENT: Conjunctivae and lids unremarkable. ?Cardiovascular: Regular rhythm.  ?Lungs: Normal work of breathing. ?Neurologic: No focal deficits.  ? ?Lab Results  ?Component Value Date  ? CREATININE 0.73 12/22/2019  ? BUN 14 12/22/2019  ? NA 143 12/22/2019  ? K 4.9 12/22/2019  ? CL 106 12/22/2019  ? CO2 23 12/22/2019  ? ?Lab Results  ?Component Value Date  ? ALT 11 12/22/2019  ? AST 13 12/22/2019  ? ALKPHOS 54 12/22/2019  ? BILITOT 0.3 12/22/2019  ? ?Lab Results  ?Component Value Date  ? HGBA1C 4.7 10/04/2021  ? HGBA1C 4.7 (L) 12/22/2019  ? HGBA1C 5.0 10/16/2018  ? ?Lab Results  ?Component Value Date  ? INSULIN 6.0 05/27/2020  ? INSULIN 6.5 12/22/2019  ? INSULIN 3.6 10/16/2018  ? ?Lab Results  ?Component Value Date  ? TSH  3.890 12/22/2019  ? ?Lab Results  ?Component Value Date  ? CHOL 158 12/22/2019  ? HDL 51 12/22/2019  ? [place] 96 12/22/2019  ? TRIG 54 12/22/2019  ? ?Lab Results  ?Component Value Date  ? VD25OH 55.4 05/27/2020  ? VD25OH 32.0 12/22/2019  ? VD25OH 46.9 05/05/2019  ? ?Lab Results  ?Component Value Date  ? WBC 6.4 12/22/2019  ? HGB 13.6 12/22/2019  ? HCT 40.2 12/22/2019  ? MCV 94 12/22/2019  ? PLT 242 12/22/2019  ? ?No results found  for: IRON, TIBC, FERRITIN ? ?Attestation Statements:  ? ?Reviewed by clinician on day of visit: allergies, medications, problem list, medical history, surgical history, family history, social history, and previous encounter notes. ? ?Time spent on visit including pre-visit chart review and post-visit care and charting was 30 minutes.  ? ?I, Tonye Pearson, am acting as Location manager for [employer], PA-C. ? ?I have reviewed the above documentation for accuracy and completeness, and I agree with the above. Abby Potash, PA-C ? ?

## 2022-03-15 ENCOUNTER — Encounter (INDEPENDENT_AMBULATORY_CARE_PROVIDER_SITE_OTHER): Payer: Self-pay | Admitting: Physician Assistant

## 2022-03-15 ENCOUNTER — Ambulatory Visit (INDEPENDENT_AMBULATORY_CARE_PROVIDER_SITE_OTHER): Payer: 59 | Admitting: Physician Assistant

## 2022-03-15 VITALS — BP 108/68 | Ht 64.0 in | Wt 193.6 lb

## 2022-03-15 DIAGNOSIS — E669 Obesity, unspecified: Secondary | ICD-10-CM | POA: Diagnosis not present

## 2022-03-15 DIAGNOSIS — E559 Vitamin D deficiency, unspecified: Secondary | ICD-10-CM

## 2022-03-15 DIAGNOSIS — Z6833 Body mass index (BMI) 33.0-33.9, adult: Secondary | ICD-10-CM | POA: Diagnosis not present

## 2022-03-15 NOTE — Telephone Encounter (Signed)
Please review

## 2022-03-27 NOTE — Progress Notes (Signed)
? ? ? ?Chief Complaint:  ? ?OBESITY ?Sherion is here to discuss her progress with her obesity treatment plan along with follow-up of her obesity related diagnoses. Magdelyn is on keeping a food journal and adhering to recommended goals of 1500-1600 calories and 95 grams of  protein and states she is following her eating plan approximately 75% of the time. Fatimah states she is walking and strength training 3 miles  and 170 minutes 2 to 3 times per week. ? ?Today's visit was #: 52  ?Starting weight: 191 lbs ?Starting date: 10/16/2018 ?Today's weight: 193 lbs ?Today's date: 03/15/2022 ?Total lbs lost to date: 0 ?Total lbs lost since last in-office visit: 0 ? ?Interim History: Daelynn reports that the first 2 weeks after last visit she was on plan and she was journaling well. Then she had spring break and got slightly off plan. She is trying to do more plant based foods, about 70/30. Labs were done at her PCP in 10/22. She will provide a copy via Mychart on next visit. ? ?Subjective:  ? ?1. Vitamin D deficiency ?Sherrye is on Vitamin D 5,000 units every other day. ? ?Assessment/Plan:  ? ?1. Vitamin D deficiency ?Sharie will continue OTC Vitamin D 5,000 IU every other day. She will follow-up for routine testing of Vitamin D, at least 2-3 times per year to avoid over-replacement. ? ?2. Obesity: Currrent BMI 33.21 ? ?Aluel is currently in the action stage of change. As such, her goal is to continue with weight loss efforts. She has agreed to keeping a food journal and adhering to recommended goals of 1500-1600 calories and 95 grams of  protein daily. ? ?Exercise goals: As is. ? ?Behavioral modification strategies: meal planning and cooking strategies, keeping healthy foods in the home, and planning for success. ? ?Crystalann has agreed to follow-up with our clinic in 4 weeks. She was informed of the importance of frequent follow-up visits to maximize her success with intensive lifestyle modifications for her multiple  health conditions.  ? ?Objective:  ? ?Blood pressure 108/68, height '5\' 4"'$  (1.626 m), weight 193 lb 9.6 oz (87.8 kg). ?Body mass index is 33.23 kg/m?. ? ?General: Cooperative, alert, well developed, in no acute distress. ?HEENT: Conjunctivae and lids unremarkable. ?Cardiovascular: Regular rhythm.  ?Lungs: Normal work of breathing. ?Neurologic: No focal deficits.  ? ?Lab Results  ?Component Value Date  ? CREATININE 0.73 12/22/2019  ? BUN 14 12/22/2019  ? NA 143 12/22/2019  ? K 4.9 12/22/2019  ? CL 106 12/22/2019  ? CO2 23 12/22/2019  ? ?Lab Results  ?Component Value Date  ? ALT 11 12/22/2019  ? AST 13 12/22/2019  ? ALKPHOS 54 12/22/2019  ? BILITOT 0.3 12/22/2019  ? ?Lab Results  ?Component Value Date  ? HGBA1C 4.7 10/04/2021  ? HGBA1C 4.7 (L) 12/22/2019  ? HGBA1C 5.0 10/16/2018  ? ?Lab Results  ?Component Value Date  ? INSULIN 6.0 05/27/2020  ? INSULIN 6.5 12/22/2019  ? INSULIN 3.6 10/16/2018  ? ?Lab Results  ?Component Value Date  ? TSH 3.890 12/22/2019  ? ?Lab Results  ?Component Value Date  ? CHOL 158 12/22/2019  ? HDL 51 12/22/2019  ? [place] 96 12/22/2019  ? TRIG 54 12/22/2019  ? ?Lab Results  ?Component Value Date  ? VD25OH 55.4 05/27/2020  ? VD25OH 32.0 12/22/2019  ? VD25OH 46.9 05/05/2019  ? ?Lab Results  ?Component Value Date  ? WBC 6.4 12/22/2019  ? HGB 13.6 12/22/2019  ? HCT 40.2 12/22/2019  ? MCV  94 12/22/2019  ? PLT 242 12/22/2019  ? ?No results found for: IRON, TIBC, FERRITIN ? ?Attestation Statements:  ? ?Reviewed by clinician on day of visit: allergies, medications, problem list, medical history, surgical history, family history, social history, and previous encounter notes. ? ?Time spent on visit including pre-visit chart review and post-visit care and charting was 30 minutes.  ? ?I, Brendell Tyus, am acting as transcriptionist for [employer], PA-C. ? ?I have reviewed the above documentation for accuracy and completeness, and I agree with the above. Abby Potash, PA-C ? ?

## 2022-04-12 ENCOUNTER — Encounter (INDEPENDENT_AMBULATORY_CARE_PROVIDER_SITE_OTHER): Payer: Self-pay | Admitting: Nurse Practitioner

## 2022-04-12 ENCOUNTER — Other Ambulatory Visit (HOSPITAL_COMMUNITY): Payer: Self-pay

## 2022-04-12 ENCOUNTER — Ambulatory Visit (INDEPENDENT_AMBULATORY_CARE_PROVIDER_SITE_OTHER): Payer: 59 | Admitting: Nurse Practitioner

## 2022-04-12 VITALS — BP 91/63 | HR 69 | Temp 97.5°F | Ht 64.0 in | Wt 194.0 lb

## 2022-04-12 DIAGNOSIS — F3289 Other specified depressive episodes: Secondary | ICD-10-CM

## 2022-04-12 DIAGNOSIS — E669 Obesity, unspecified: Secondary | ICD-10-CM

## 2022-04-12 DIAGNOSIS — Z6833 Body mass index (BMI) 33.0-33.9, adult: Secondary | ICD-10-CM

## 2022-04-12 DIAGNOSIS — E8881 Metabolic syndrome: Secondary | ICD-10-CM | POA: Diagnosis not present

## 2022-04-12 DIAGNOSIS — Z6832 Body mass index (BMI) 32.0-32.9, adult: Secondary | ICD-10-CM

## 2022-04-12 MED ORDER — BUPROPION HCL ER (SR) 150 MG PO TB12
ORAL_TABLET | ORAL | 0 refills | Status: DC
  Filled 2022-04-12: qty 90, 90d supply, fill #0

## 2022-04-13 NOTE — Progress Notes (Signed)
Chief Complaint:   OBESITY [name] is here to discuss her progress with her obesity treatment plan along with follow-up of her obesity related diagnoses. [name] is on keeping a food journal and adhering to recommended goals of 1500-1600 calories and 95 grams of protein and states she is following her eating plan approximately 95% of the time. [name] states she is walking for 3-4 miles and strength training for 30-60 times per week.  Today's visit was #: 72 Starting weight: 191 lbs Starting date: 10/16/2018 Today's weight: 194 lbs Today's date: 04/12/2022 Total lbs lost to date: 0 Total lbs lost since last in-office visit: 0  Interim History: [name] struggles with finding consistency. She has a lot going on. She is considering changing jobs. She has always struggled with her weight. She has been on vacation since her last visit. She does well with breakfast, lunch, and dinner. She struggles with "messing up" after dinner with cravings. She took [place] in the past and stopped due to side effects. She has also tried Ozempic and stopped due to side effects. She is questioning if she had "a touch of pancreatitis" while taking Ozempic.  Was never evaluated.   She took Topamax in the past and stopped due to side effects. She is struggling with stress eating.   Subjective:   1. Insulin resistance [name]'s last labs looked better. She took Ozempic and Wegovy in the past and stopped both due to side effects. She never took Metformin and is not interested in starting.   2. Other depression, with emotional eating [name] is taking Wellbutrin SR 150 mg currently. She denies side effects.   Assessment/Plan:   1. Insulin resistance Denim will continue to work on weight loss, exercise, and decreasing simple carbohydrates to help decrease the risk of diabetes. Keeli agreed to follow-up with [place] as directed to closely monitor her progress.  2. Other depression, with emotional eating [name]  was referred back to Dr. Mallie Mussel our Bariatric Psychologist. She last saw her in 2020. We will refill Wellbutrin SR 150 mg for 3 months with no refills. Behavior modification techniques were discussed today to help [name] deal with her emotional/non-hunger eating behaviors.  Orders and follow up as documented in patient record.   - buPROPion (WELLBUTRIN SR) 150 MG 12 hr tablet; Take one tablet once daily  Dispense: 90 tablet; Refill: 0  3. Obesity: Currrent BMI 33.4 [name] is currently in the action stage of change. As such, her goal is to continue with weight loss efforts. She has agreed to keeping a food journal and adhering to recommended goals of 1500-1600 calories and 95 grams of protein.   Exercise goals:  As is.   Behavioral modification strategies: increasing lean protein intake, increasing water intake, and no skipping meals.  [name] has agreed to follow-up with our clinic in 4 weeks. She was informed of the importance of frequent follow-up visits to maximize her success with intensive lifestyle modifications for her multiple health conditions.   Objective:   Blood pressure 91/63, pulse 69, temperature (!) 97.5 F (36.4 C), height '5\' 4"'$  (1.626 m), weight 194 lb (88 kg), SpO2 98 %. Body mass index is 33.3 kg/m.  General: Cooperative, alert, well developed, in no acute distress. HEENT: Conjunctivae and lids unremarkable. Cardiovascular: Regular rhythm.  Lungs: Normal work of breathing. Neurologic: No focal deficits.   Lab Results  Component Value Date   CREATININE 0.73 12/22/2019   BUN 14 12/22/2019   NA 143 12/22/2019   K  4.9 12/22/2019   CL 106 12/22/2019   CO2 23 12/22/2019   Lab Results  Component Value Date   ALT 11 12/22/2019   AST 13 12/22/2019   ALKPHOS 54 12/22/2019   BILITOT 0.3 12/22/2019   Lab Results  Component Value Date   HGBA1C 4.7 10/04/2021   HGBA1C 4.7 (L) 12/22/2019   HGBA1C 5.0 10/16/2018   Lab Results  Component Value Date   INSULIN 6.0  05/27/2020   INSULIN 6.5 12/22/2019   INSULIN 3.6 10/16/2018   Lab Results  Component Value Date   TSH 3.890 12/22/2019   Lab Results  Component Value Date   CHOL 158 12/22/2019   HDL 51 12/22/2019   LDLCALC 96 12/22/2019   TRIG 54 12/22/2019   Lab Results  Component Value Date   VD25OH 55.4 05/27/2020   VD25OH 32.0 12/22/2019   VD25OH 46.9 05/05/2019   Lab Results  Component Value Date   WBC 6.4 12/22/2019   HGB 13.6 12/22/2019   HCT 40.2 12/22/2019   MCV 94 12/22/2019   PLT 242 12/22/2019   No results found for: IRON, TIBC, FERRITIN  Attestation Statements:   Reviewed by clinician on day of visit: allergies, medications, problem list, medical history, surgical history, family history, social history, and previous encounter notes.  Time spent on visit including pre-visit chart review and post-visit care and charting was 30 minutes.   I, Lizbeth Bark, RMA, am acting as Location manager for Everardo [employer], FNP.   I have reviewed the above documentation for accuracy and completeness, and I agree with the above. Everardo Pacific, FNP

## 2022-04-20 ENCOUNTER — Other Ambulatory Visit (HOSPITAL_COMMUNITY): Payer: Self-pay

## 2022-04-25 ENCOUNTER — Other Ambulatory Visit (HOSPITAL_COMMUNITY): Payer: Self-pay
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
## 2022-05-09 ENCOUNTER — Telehealth (INDEPENDENT_AMBULATORY_CARE_PROVIDER_SITE_OTHER): Payer: 59 | Admitting: Psychology

## 2022-05-09 ENCOUNTER — Ambulatory Visit (INDEPENDENT_AMBULATORY_CARE_PROVIDER_SITE_OTHER): Payer: 59 | Admitting: Nurse Practitioner

## 2022-05-09 ENCOUNTER — Encounter (INDEPENDENT_AMBULATORY_CARE_PROVIDER_SITE_OTHER): Payer: Self-pay | Admitting: Nurse Practitioner

## 2022-05-09 VITALS — BP 99/68 | HR 79 | Temp 97.6°F | Ht 64.0 in | Wt 196.0 lb

## 2022-05-09 DIAGNOSIS — E669 Obesity, unspecified: Secondary | ICD-10-CM | POA: Diagnosis not present

## 2022-05-09 DIAGNOSIS — E559 Vitamin D deficiency, unspecified: Secondary | ICD-10-CM

## 2022-05-09 DIAGNOSIS — Z6833 Body mass index (BMI) 33.0-33.9, adult: Secondary | ICD-10-CM | POA: Diagnosis not present

## 2022-05-11 NOTE — Progress Notes (Signed)
Chief Complaint:   OBESITY [name] is here to discuss her progress with her obesity treatment plan along with follow-up of her obesity related diagnoses. [name] is on keeping a food journal and adhering to recommended goals of 1500-1600 calories and 95 grams of protein and states she is following her eating plan approximately 50% of the time. [name] states she is walking 45-60 minutes 3 times per week.  Today's visit was #: 15 Starting weight: 191 lbs Starting date: 10/16/2018 Today's weight: 196 lbs Today's date: 05/09/2022 Total lbs lost to date: 0 lbs Total lbs lost since last in-office visit: 0  Interim History: It has been a crazy 2 weeks [name] voices. Her mother had surgery and her last day at her current job is today. She is leaving for the beach tomorrow. Her son graduated from the 8th grade. She has gotten off track while helping her mother. When she journals she hits her calories and protein goals. Stress comes when she gets off track. She has not been able to meal prep. She denies hunger and cravings but struggles with snacking after dinner. Planning to get back on track once she is home and starts her new job.  Subjective:   1. Vitamin D deficiency [name] is currently taking over the counter Vit D 5,000 IU. Denies any nausea, vomiting or muscle weakness.  Assessment/Plan:   1. Vitamin D deficiency [name] will continue to take over the counter Vit D.  Low Vitamin D level contributes to fatigue and are associated with obesity, breast, and colon cancer.    2. Obesity: Currrent BMI 33.7 [name] is currently in the action stage of change. As such, her goal is to continue with weight loss efforts. She has agreed to following a lower carbohydrate, vegetable and lean protein rich diet plan.   Exercise goals: As is.  Behavioral modification strategies: increasing lean protein intake, increasing water intake, and travel eating strategies.  [name] has agreed to  follow-up with our clinic in 4 weeks. She was informed of the importance of frequent follow-up visits to maximize her success with intensive lifestyle modifications for her multiple health conditions.   Objective:   Blood pressure 99/68, pulse 79, temperature 97.6 F (36.4 C), height '5\' 4"'$  (1.626 m), weight 196 lb (88.9 kg), SpO2 97 %. Body mass index is 33.64 kg/m.  General: Cooperative, alert, well developed, in no acute distress. HEENT: Conjunctivae and lids unremarkable. Cardiovascular: Regular rhythm.  Lungs: Normal work of breathing. Neurologic: No focal deficits.   Lab Results  Component Value Date   CREATININE 0.73 12/22/2019   BUN 14 12/22/2019   NA 143 12/22/2019   K 4.9 12/22/2019   CL 106 12/22/2019   CO2 23 12/22/2019   Lab Results  Component Value Date   ALT 11 12/22/2019   AST 13 12/22/2019   ALKPHOS 54 12/22/2019   BILITOT 0.3 12/22/2019   Lab Results  Component Value Date   HGBA1C 4.7 10/04/2021   HGBA1C 4.7 (L) 12/22/2019   HGBA1C 5.0 10/16/2018   Lab Results  Component Value Date   INSULIN 6.0 05/27/2020   INSULIN 6.5 12/22/2019   INSULIN 3.6 10/16/2018   Lab Results  Component Value Date   TSH 3.890 12/22/2019   Lab Results  Component Value Date   CHOL 158 12/22/2019   HDL 51 12/22/2019   LDLCALC 96 12/22/2019   TRIG 54 12/22/2019   Lab Results  Component Value Date   VD25OH 55.4 05/27/2020   VD25OH  32.0 12/22/2019   VD25OH 46.9 05/05/2019   Lab Results  Component Value Date   WBC 6.4 12/22/2019   HGB 13.6 12/22/2019   HCT 40.2 12/22/2019   MCV 94 12/22/2019   PLT 242 12/22/2019   No results found for: "IRON", "TIBC", "FERRITIN"  Attestation Statements:   Reviewed by clinician on day of visit: allergies, medications, problem list, medical history, surgical history, family history, social history, and previous encounter notes.  I, Brendell Tyus, RMA, am acting as transcriptionist for Everardo Pacific, FNP..  I have  reviewed the above documentation for accuracy and completeness, and I agree with the above. Everardo Pacific, FNP

## 2022-06-01 ENCOUNTER — Ambulatory Visit (INDEPENDENT_AMBULATORY_CARE_PROVIDER_SITE_OTHER): Payer: 59 | Admitting: Nurse Practitioner

## 2022-06-12 ENCOUNTER — Other Ambulatory Visit (HOSPITAL_COMMUNITY): Payer: Self-pay

## 2022-06-12 DIAGNOSIS — H52223 Regular astigmatism, bilateral: Secondary | ICD-10-CM | POA: Diagnosis not present

## 2022-06-12 DIAGNOSIS — H04123 Dry eye syndrome of bilateral lacrimal glands: Secondary | ICD-10-CM | POA: Diagnosis not present

## 2022-06-12 DIAGNOSIS — H5203 Hypermetropia, bilateral: Secondary | ICD-10-CM | POA: Diagnosis not present

## 2022-06-12 DIAGNOSIS — H524 Presbyopia: Secondary | ICD-10-CM | POA: Diagnosis not present

## 2022-06-12 MED ORDER — CYCLOSPORINE 0.05 % OP EMUL
OPHTHALMIC | 4 refills | Status: DC
  Filled 2022-06-12: qty 180, 90d supply, fill #0

## 2022-07-05 ENCOUNTER — Encounter (INDEPENDENT_AMBULATORY_CARE_PROVIDER_SITE_OTHER): Payer: Self-pay

## 2022-07-24 ENCOUNTER — Ambulatory Visit (INDEPENDENT_AMBULATORY_CARE_PROVIDER_SITE_OTHER): Payer: 59 | Admitting: Nurse Practitioner

## 2022-07-24 ENCOUNTER — Other Ambulatory Visit (HOSPITAL_COMMUNITY): Payer: Self-pay

## 2022-07-24 ENCOUNTER — Encounter (INDEPENDENT_AMBULATORY_CARE_PROVIDER_SITE_OTHER): Payer: Self-pay | Admitting: Nurse Practitioner

## 2022-07-24 VITALS — BP 99/66 | HR 83 | Temp 98.0°F | Ht 64.0 in | Wt 203.0 lb

## 2022-07-24 DIAGNOSIS — Z6834 Body mass index (BMI) 34.0-34.9, adult: Secondary | ICD-10-CM | POA: Diagnosis not present

## 2022-07-24 DIAGNOSIS — F3289 Other specified depressive episodes: Secondary | ICD-10-CM

## 2022-07-24 DIAGNOSIS — E669 Obesity, unspecified: Secondary | ICD-10-CM | POA: Diagnosis not present

## 2022-07-24 MED ORDER — BUPROPION HCL ER (SR) 150 MG PO TB12
ORAL_TABLET | ORAL | 0 refills | Status: DC
  Filled 2022-07-24: qty 60, 30d supply, fill #0

## 2022-07-31 NOTE — Progress Notes (Signed)
Chief Complaint:   OBESITY [name] is here to discuss her progress with her obesity treatment plan along with follow-up of her obesity related diagnoses. [name] is on following a lower carbohydrate, vegetable and lean protein rich diet plan and states she is following her eating plan approximately 0% of the time. [name] states she is walking 30 minutes 2-3 times per week.  Today's visit was #: 15 Starting weight: 191 lbs Starting date: 10/16/2018 Today's weight: 203 lbs Today's date: 07/24/2022 Total lbs lost to date: 0 Total lbs lost since last in-office visit: +7 lbs  Interim History: was seen last on 05/09/2022 and has gained 7 lbs since last visit.  Since her last visit she went on vacation.  Reports her new job is going well, her kids went back to school.  She feels overwhelmed and has gotten off track with meal plan and exercising.  Her husband is being worked up for lung cancer.  She has been stress eating and it has gotten out of hand.  She is struggling in the evenings and on the weekends.    Subjective:   1. Other depression, with emotional eating Taking Wellbutrin SR 150 mg once daily.  Denies side effects.  In the past she had taken it BID but went to once daily due to forgetting to take the second dose.   Assessment/Plan:   1. Other depression, with emotional eating Increase - buPROPion (WELLBUTRIN SR) 150 MG 12 hr tablet; Take one tablet by mouth twice daily  Dispense: 60 tablet; Refill: 0 (side effects discussed)  Referral- Ambulatory referral to Psychiatry  2. Obesity: Currrent BMI 34.9 Refer to behavioral health for stress, emotional eating.  Encouraged labs.   [name] is currently in the action stage of change. As such, her goal is to continue with weight loss efforts. She has agreed to keeping a food journal and adhering to recommended goals of 1500 calories and 80+ protein.   Exercise goals:  As is.   Behavioral modification strategies: increasing lean  protein intake, increasing vegetables, and increasing water intake.  [name] has agreed to follow-up with our clinic in 2 weeks. She was informed of the importance of frequent follow-up visits to maximize her success with intensive lifestyle modifications for her multiple health conditions.   Objective:   Blood pressure 99/66, pulse 83, temperature 98 F (36.7 C), height '5\' 4"'$  (1.626 m), weight 203 lb (92.1 kg), SpO2 96 %. Body mass index is 34.84 kg/m.  General: Cooperative, alert, well developed, in no acute distress. HEENT: Conjunctivae and lids unremarkable. Cardiovascular: Regular rhythm.  Lungs: Normal work of breathing. Neurologic: No focal deficits.   Lab Results  Component Value Date   CREATININE 0.73 12/22/2019   BUN 14 12/22/2019   NA 143 12/22/2019   K 4.9 12/22/2019   CL 106 12/22/2019   CO2 23 12/22/2019   Lab Results  Component Value Date   ALT 11 12/22/2019   AST 13 12/22/2019   ALKPHOS 54 12/22/2019   BILITOT 0.3 12/22/2019   Lab Results  Component Value Date   HGBA1C 4.7 10/04/2021   HGBA1C 4.7 (L) 12/22/2019   HGBA1C 5.0 10/16/2018   Lab Results  Component Value Date   INSULIN 6.0 05/27/2020   INSULIN 6.5 12/22/2019   INSULIN 3.6 10/16/2018   Lab Results  Component Value Date   TSH 3.890 12/22/2019   Lab Results  Component Value Date   CHOL 158 12/22/2019   HDL 51 12/22/2019  LDLCALC 96 12/22/2019   TRIG 54 12/22/2019   Lab Results  Component Value Date   VD25OH 55.4 05/27/2020   VD25OH 32.0 12/22/2019   VD25OH 46.9 05/05/2019   Lab Results  Component Value Date   WBC 6.4 12/22/2019   HGB 13.6 12/22/2019   HCT 40.2 12/22/2019   MCV 94 12/22/2019   PLT 242 12/22/2019   No results found for: "IRON", "TIBC", "FERRITIN"  Attestation Statements:   Reviewed by clinician on day of visit: allergies, medications, problem list, medical history, surgical history, family history, social history, and previous encounter notes.  I,  Davy Pique, RMA, am acting as transcriptionist for Everardo Pacific, FNP  I have reviewed the above documentation for accuracy and completeness, and I agree with the above. Everardo Pacific, FNP

## 2022-08-07 ENCOUNTER — Ambulatory Visit (INDEPENDENT_AMBULATORY_CARE_PROVIDER_SITE_OTHER): Payer: 59 | Admitting: Nurse Practitioner

## 2022-08-07 ENCOUNTER — Encounter (INDEPENDENT_AMBULATORY_CARE_PROVIDER_SITE_OTHER): Payer: Self-pay | Admitting: Nurse Practitioner

## 2022-08-07 VITALS — BP 112/76 | HR 70 | Temp 98.1°F | Ht 64.0 in | Wt 204.0 lb

## 2022-08-07 DIAGNOSIS — E669 Obesity, unspecified: Secondary | ICD-10-CM

## 2022-08-07 DIAGNOSIS — F3289 Other specified depressive episodes: Secondary | ICD-10-CM

## 2022-08-07 DIAGNOSIS — Z6835 Body mass index (BMI) 35.0-35.9, adult: Secondary | ICD-10-CM | POA: Diagnosis not present

## 2022-08-09 NOTE — Progress Notes (Signed)
Chief Complaint:   OBESITY [name] is here to discuss her progress with her obesity treatment plan along with follow-up of her obesity related diagnoses. [name] is on keeping a food journal and adhering to recommended goals of 1500 calories and 80+ grams of protein and states she is following her eating plan approximately 90% of the time. [name] states she is doing cardio and strength training 30 minutes 5-6 times per week.  Today's visit was #: 1 Starting weight: 191 lbs Starting date: 10/16/2018 Today's weight: 204 lbs Today's date: 08/07/2022 Total lbs lost to date: 0 lbs Total lbs lost since last in-office visit: 0  Interim History: [name] feels things are doing better. Calories: 1500. Protein: 70-80 grams. Breakfast: eggs cups with protein. Lunch: frozen meal, 15+ grams of protein. Dinner: protein and veggies. Drinking coffee and water. Denies polyphiagia. Cravings are better.  Subjective:   1. Other depression, with emotional eating [name] is taking Wellbutrin SR 150 mg twice a day. Denies any side effects. She has an appointment with a therapist in Oct. Increase in Wellbutrin has helped with cravings.  Assessment/Plan:   1. Other depression, with emotional eating Continue taking Wellbutrin SR 150 mg twice a day. Side effects discussed.   2. Obesity: Currrent BMI 35.0 [name] is currently in the action stage of change. As such, her goal is to continue with weight loss efforts. She has agreed to keeping a food journal and adhering to recommended goals of 1500 calories and 80+ grams of protein.   Exercise goals: As is.  [name] is seeing her PCP in Nov for CPE and labs.  Behavioral modification strategies: increasing lean protein intake, increasing vegetables, and increasing water intake.  [name] has agreed to follow-up with our clinic in 4 weeks. She was informed of the importance of frequent follow-up visits to maximize her success with intensive lifestyle  modifications for her multiple health conditions.   Objective:   Blood pressure 112/76, pulse 70, temperature 98.1 F (36.7 C), height '5\' 4"'$  (1.626 m), weight 204 lb (92.5 kg), SpO2 97 %. Body mass index is 35.02 kg/m.  General: Cooperative, alert, well developed, in no acute distress. HEENT: Conjunctivae and lids unremarkable. Cardiovascular: Regular rhythm.  Lungs: Normal work of breathing. Neurologic: No focal deficits.   Lab Results  Component Value Date   CREATININE 0.73 12/22/2019   BUN 14 12/22/2019   NA 143 12/22/2019   K 4.9 12/22/2019   CL 106 12/22/2019   CO2 23 12/22/2019   Lab Results  Component Value Date   ALT 11 12/22/2019   AST 13 12/22/2019   ALKPHOS 54 12/22/2019   BILITOT 0.3 12/22/2019   Lab Results  Component Value Date   HGBA1C 4.7 10/04/2021   HGBA1C 4.7 (L) 12/22/2019   HGBA1C 5.0 10/16/2018   Lab Results  Component Value Date   INSULIN 6.0 05/27/2020   INSULIN 6.5 12/22/2019   INSULIN 3.6 10/16/2018   Lab Results  Component Value Date   TSH 3.890 12/22/2019   Lab Results  Component Value Date   CHOL 158 12/22/2019   HDL 51 12/22/2019   LDLCALC 96 12/22/2019   TRIG 54 12/22/2019   Lab Results  Component Value Date   VD25OH 55.4 05/27/2020   VD25OH 32.0 12/22/2019   VD25OH 46.9 05/05/2019   Lab Results  Component Value Date   WBC 6.4 12/22/2019   HGB 13.6 12/22/2019   HCT 40.2 12/22/2019   MCV 94 12/22/2019   PLT 242 12/22/2019  No results found for: "IRON", "TIBC", "FERRITIN"  Attestation Statements:   Reviewed by clinician on day of visit: allergies, medications, problem list, medical history, surgical history, family history, social history, and previous encounter notes.  I, Brendell Tyus, RMA, am acting as transcriptionist for Everardo Pacific, FNP.  I have reviewed the above documentation for accuracy and completeness, and I agree with the above. Everardo Pacific, FNP

## 2022-08-29 ENCOUNTER — Ambulatory Visit (INDEPENDENT_AMBULATORY_CARE_PROVIDER_SITE_OTHER): Payer: 59 | Admitting: Behavioral Health

## 2022-08-29 DIAGNOSIS — F4323 Adjustment disorder with mixed anxiety and depressed mood: Secondary | ICD-10-CM

## 2022-08-29 NOTE — Progress Notes (Signed)
                Kristianne Albin L Coulson Wehner, LMFT 
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
## 2022-09-04 ENCOUNTER — Ambulatory Visit (INDEPENDENT_AMBULATORY_CARE_PROVIDER_SITE_OTHER): Payer: 59 | Admitting: Adult Health

## 2022-09-04 ENCOUNTER — Encounter (INDEPENDENT_AMBULATORY_CARE_PROVIDER_SITE_OTHER): Payer: Self-pay | Admitting: Adult Health

## 2022-09-04 ENCOUNTER — Other Ambulatory Visit (HOSPITAL_COMMUNITY): Payer: Self-pay

## 2022-09-04 VITALS — BP 114/71 | HR 73 | Temp 98.0°F | Ht 64.0 in | Wt 205.0 lb

## 2022-09-04 DIAGNOSIS — E669 Obesity, unspecified: Secondary | ICD-10-CM

## 2022-09-04 DIAGNOSIS — Z6835 Body mass index (BMI) 35.0-35.9, adult: Secondary | ICD-10-CM | POA: Diagnosis not present

## 2022-09-04 DIAGNOSIS — E88819 Insulin resistance, unspecified: Secondary | ICD-10-CM

## 2022-09-04 DIAGNOSIS — F3289 Other specified depressive episodes: Secondary | ICD-10-CM

## 2022-09-04 MED ORDER — BUPROPION HCL ER (SR) 150 MG PO TB12
150.0000 mg | ORAL_TABLET | Freq: Two times a day (BID) | ORAL | 0 refills | Status: DC
  Filled 2022-09-04: qty 60, 30d supply, fill #0

## 2022-09-11 NOTE — Progress Notes (Unsigned)
Chief Complaint:   OBESITY [name] is here to discuss her progress with her obesity treatment plan along with follow-up of her obesity related diagnoses. [name] is on keeping a food journal and adhering to recommended goals of 1500 calories and 80 protein daily and states she is following her eating plan approximately 50% of the time. [name] states she is not exercising due to hip pain.   Today's visit was #: 50 Starting weight: 191 lbs Starting date: 10/16/2018 Today's weight: 205 lbs Today's date: 09/04/2022 Total lbs lost to date: 0 Total lbs lost since last in-office visit: 0  Interim History: Recent increased left hip pain, exacerbated when walking or sitting.  Pain started approximately 3 days ago.  She has an appointment with sports medicine October 14.  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   Subjective:   1. Insulin resistance Insulin level fluctuates between 3.6-6.0.  2. Other depression, with emotional eating She denies suicidal or homicidal ideations.  She endorses emotional eating, which happens often in the evening.  She recently established with a therapist.   Assessment/Plan:   1. Insulin resistance Increased protein, decreased sugar and carbohydrates.   2. Other depression, with emotional eating Refill - buPROPion (WELLBUTRIN SR) 150 MG 12 hr tablet; Take 1 tablet (150 mg total) by mouth 2 (two) times daily.  Dispense: 60 tablet; Refill: 0  3. Obesity: Currrent BMI 35.3 [name] is currently in the action stage of change. As such, her goal is to continue with weight loss efforts. She has agreed to keeping a food journal and adhering to recommended goals of 1500 calories and 80 protein daily.   Exercise goals:  As is.   Behavioral modification strategies: increasing lean protein intake, decreasing simple carbohydrates, meal planning and cooking strategies, keeping healthy foods in the home, and planning for  success.  [name] has agreed to follow-up with our clinic in 3-4 weeks. She was informed of the importance of frequent follow-up visits to maximize her success with intensive lifestyle modifications for her multiple health conditions.   Objective:   Blood pressure 114/71, pulse 73, temperature 98 F (36.7 C), height '5\' 4"'$  (1.626 m), weight 205 lb (93 kg), SpO2 96 %. Body mass index is 35.19 kg/m.  General: Cooperative, alert, well developed, in no acute distress. HEENT: Conjunctivae and lids unremarkable. Cardiovascular: Regular rhythm.  Lungs: Normal work of breathing. Neurologic: No focal deficits.   Lab Results  Component Value Date   CREATININE 0.73 12/22/2019   BUN 14 12/22/2019   NA 143 12/22/2019   K 4.9 12/22/2019   CL 106 12/22/2019   CO2 23 12/22/2019   Lab Results  Component Value Date   ALT 11 12/22/2019   AST 13 12/22/2019   ALKPHOS 54 12/22/2019   BILITOT 0.3 12/22/2019   Lab Results  Component Value Date   HGBA1C 4.7 10/04/2021   HGBA1C 4.7 (L) 12/22/2019   HGBA1C 5.0 10/16/2018   Lab Results  Component Value Date   INSULIN 6.0 05/27/2020   INSULIN 6.5 12/22/2019   INSULIN 3.6 10/16/2018   Lab Results  Component Value Date   TSH 3.890 12/22/2019   Lab Results  Component Value Date   CHOL 158 12/22/2019   HDL 51 12/22/2019   LDLCALC 96 12/22/2019   TRIG 54 12/22/2019   Lab Results  Component Value Date   VD25OH 55.4 05/27/2020   VD25OH 32.0 12/22/2019  VD25OH 46.9 05/05/2019   Lab Results  Component Value Date   WBC 6.4 12/22/2019   HGB 13.6 12/22/2019   HCT 40.2 12/22/2019   MCV 94 12/22/2019   PLT 242 12/22/2019   No results found for: "IRON", "TIBC", "FERRITIN"  Attestation Statements:   Reviewed by clinician on day of visit: allergies, medications, problem list, medical history, surgical history, family history, social history, and previous encounter notes.  I, Davy Pique, RMA, am acting as Location manager for Mina Marble, NP.  I have reviewed the above documentation for accuracy and completeness, and I agree with the above. -  ***

## 2022-09-13 ENCOUNTER — Telehealth: Payer: 59 | Admitting: Emergency Medicine

## 2022-09-13 ENCOUNTER — Ambulatory Visit (INDEPENDENT_AMBULATORY_CARE_PROVIDER_SITE_OTHER): Payer: 59 | Admitting: Behavioral Health

## 2022-09-13 DIAGNOSIS — F4323 Adjustment disorder with mixed anxiety and depressed mood: Secondary | ICD-10-CM

## 2022-09-13 DIAGNOSIS — L739 Follicular disorder, unspecified: Secondary | ICD-10-CM

## 2022-09-13 NOTE — Progress Notes (Signed)
                Sanyiah Kanzler L Erinne Gillentine, LMFT 

## 2022-09-13 NOTE — Progress Notes (Signed)
E Visit for Rash  We are sorry that you are not feeling well. Here is how we plan to help!   Based upon what you have shared with me it looks like you have a bacterial follicultits.  Folliculitis is inflammation of the hair follicles that can be caused by a superficial infection of the skin.   I recommend the use of benzoyl peroxide, an over the counter acne medication. Apply benzoyl peroxide twice a day to the affected area until the lesions clear up.   HOME CARE:  Take cool showers and avoid direct sunlight. Apply cool compress or wet dressings. Take a bath in an oatmeal bath.  Sprinkle content of one Aveeno packet under running faucet with comfortably warm water.  Bathe for 15-20 minutes, 1-2 times daily.  Pat dry with a towel. Do not rub the rash. Use hydrocortisone cream. Take an antihistamine like Benadryl for widespread rashes that itch.  The adult dose of Benadryl is 25-50 mg by mouth 4 times daily. Caution:  This type of medication may cause sleepiness.  Do not drink alcohol, drive, or operate dangerous machinery while taking antihistamines.  Do not take these medications if you have prostate enlargement.  Read package instructions thoroughly on all medications that you take.  GET HELP RIGHT AWAY IF:  Symptoms don't go away after treatment. Severe itching that persists. If you rash spreads or swells. If you rash begins to smell. If it blisters and opens or develops a yellow-brown crust. You develop a fever. You have a sore throat. You become short of breath.  MAKE SURE YOU:  Understand these instructions. Will watch your condition. Will get help right away if you are not doing well or get worse.  Thank you for choosing an e-visit.  Your e-visit answers were reviewed by a board certified advanced clinical practitioner to complete your personal care plan. Depending upon the condition, your plan could have included both over the counter or prescription  medications.  Please review your pharmacy choice. Make sure the pharmacy is open so you can pick up prescription now. If there is a problem, you may contact your provider through [employer] and have the prescription routed to another pharmacy.  Your safety is important to [place]. If you have drug allergies check your prescription carefully.   For the next 24 hours you can use MyChart to ask questions about today's visit, request a non-urgent call back, or ask for a work or school excuse. You will get an email in the next two days asking about your experience. I hope that your e-visit has been valuable and will speed your recovery.  I have spent 5 minutes in review of e-visit questionnaire, review and updating patient chart, medical decision making and response to patient.   Willeen Cass, PhD, FNP-BC

## 2022-09-13 NOTE — Progress Notes (Signed)
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   Mental Status Exam: Appearance:  Work scrubs      Behavior: Appropriate and Sharing  Motor: Normal  Speech/Language:  Clear and Coherent and Normal Rate  Affect: Appropriate  Mood: anxious  Thought process: normal  Thought content:   WNL  Sensory/Perceptual disturbances:   WNL  Orientation: oriented to person, place, and time/date  Attention: Good  Concentration: Good  Memory: WNL  Fund of knowledge:  Good  Insight:   Good  Judgment:  Good  Impulse Control: Good   Risk Assessment: Danger to Self:  No Self-injurious Behavior: No Danger to Others: No Duty to Warn:no Physical Aggression / Violence:No  Access to Firearms a concern: No  Gang Involvement:No   Subjective: Kenyonna relates she got angry w/Husb Elta Guadeloupe a few wks ago when he failed to pick up 89yo Son Woodside from [employer]. He went to the tire store instead & she later blew up @ him. She is challenged to keep up w/the busy schedule of a Family & she has been working [employer] 2 yrs w/a lack of Husb's pulling in to support the Team. She becomes frustrated, even lashing out to [employer] & saying it might be better if they were divorced.   Pt is trying to keep her perspective, but notices they are in the same pattern; she holds things inside & explodes when she reaches her limit. She explains to Husb she feels she is last on his list & disregarded. He turns to change & make more effort; things seem to be going fine until she feels things piling up on her & gets overwhelmed. An incident happens & he gets frustrated. She then works harder to fix the problem & becomes frustrated herself by  holding things inside. They are then back into their same pattern.  Interventions: Family Systems  Diagnosis:Adjustment disorder with mixed anxiety and depressed mood  Plan: Adison relates her frustration w/the Cpl's pattern today. She has tried many avenues to change things. They will try to have a Family Mtg to get everyone on the same page & for the children to witness Parents unified.  Target Date: 10/02/2022  Progress: 3  Frequency: Twice monthly  Modality: Boykin Reaper, LMFT

## 2022-09-14 DIAGNOSIS — M25552 Pain in left hip: Secondary | ICD-10-CM | POA: Diagnosis not present

## 2022-09-14 DIAGNOSIS — R269 Unspecified abnormalities of gait and mobility: Secondary | ICD-10-CM | POA: Diagnosis not present

## 2022-09-26 DIAGNOSIS — R87610 Atypical squamous cells of undetermined significance on cytologic smear of cervix (ASC-US): Secondary | ICD-10-CM | POA: Diagnosis not present

## 2022-09-26 DIAGNOSIS — Z01419 Encounter for gynecological examination (general) (routine) without abnormal findings: Secondary | ICD-10-CM | POA: Diagnosis not present

## 2022-10-02 ENCOUNTER — Ambulatory Visit (INDEPENDENT_AMBULATORY_CARE_PROVIDER_SITE_OTHER): Payer: 59 | Admitting: Behavioral Health

## 2022-10-02 DIAGNOSIS — F4323 Adjustment disorder with mixed anxiety and depressed mood: Secondary | ICD-10-CM

## 2022-10-02 NOTE — Progress Notes (Signed)
                Whittany Parish L Rogers Ditter, LMFT 

## 2022-10-02 NOTE — Progress Notes (Signed)
[place] Counselor/Therapist Progress Note  Patient ID: [name], MRN: [id number],    Date: 10/02/2022  Time Spent: 30 min In Person @ [facility]   Treatment Type: Individual Therapy  Reported Symptoms: reduction in anx/dep since improvements have happened @ home w/her [employer] & children   Mental Status Exam: Appearance:  Casual     Behavior: Appropriate and Sharing  Motor: Normal  Speech/Language:  Clear and Coherent and Normal Rate  Affect: Appropriate  Mood: normal  Thought process: normal  Thought content:   WNL  Sensory/Perceptual disturbances:   WNL  Orientation: oriented to person, place, and time/date  Attention: Good  Concentration: Good  Memory: WNL  Fund of knowledge:  Good  Insight:   Good  Judgment:  Good  Impulse Control: Fair   Risk Assessment: Danger to Self:  No Self-injurious Behavior: No Danger to Others: No Duty to Warn:no Physical Aggression / Violence:No  Access to Firearms a concern: No  Gang Involvement:No   Subjective: Pt relates an improvement w/her weight goals. She has begun her weight lifting routine again w/her Husb. She describes how she sabotages her own efforts due to imperfection. She is returning to [facility] to assist her w/her efforts.    Interventions: Solution-Oriented/Positive Psychology and Psycho-education/Bibliotherapy  Diagnosis:Adjustment disorder with mixed anxiety and depressed mood  Plan: [name] sts her & Husb are doing better. She will cont her workout routine w/him so they can keep ea other motivated. She will make attempts to reward herself for success w/other things than food to prevent sweets from being an issue.   Target Date: 12/02/2022  Progress: 5  Frequency: Twice monthly  Modality: Boykin Reaper, LMFT

## 2022-10-02 NOTE — Addendum Note (Signed)
Addended by: Donnetta Hutching on: 10/02/2022 05:20 PM   Modules accepted: Level of Service

## 2022-10-04 ENCOUNTER — Ambulatory Visit (INDEPENDENT_AMBULATORY_CARE_PROVIDER_SITE_OTHER): Payer: 59 | Admitting: Family Medicine

## 2022-10-04 ENCOUNTER — Other Ambulatory Visit (HOSPITAL_COMMUNITY): Payer: Self-pay

## 2022-10-04 ENCOUNTER — Encounter (INDEPENDENT_AMBULATORY_CARE_PROVIDER_SITE_OTHER): Payer: Self-pay | Admitting: Family Medicine

## 2022-10-04 VITALS — BP 110/74 | HR 75 | Temp 98.0°F | Ht 64.0 in | Wt 208.0 lb

## 2022-10-04 DIAGNOSIS — E669 Obesity, unspecified: Secondary | ICD-10-CM

## 2022-10-04 DIAGNOSIS — Z6835 Body mass index (BMI) 35.0-35.9, adult: Secondary | ICD-10-CM | POA: Diagnosis not present

## 2022-10-04 DIAGNOSIS — F3289 Other specified depressive episodes: Secondary | ICD-10-CM

## 2022-10-04 MED ORDER — BUPROPION HCL ER (SR) 150 MG PO TB12
150.0000 mg | ORAL_TABLET | Freq: Two times a day (BID) | ORAL | 0 refills | Status: DC
  Filled 2022-10-04: qty 60, 30d supply, fill #0

## 2022-10-05 DIAGNOSIS — M25552 Pain in left hip: Secondary | ICD-10-CM | POA: Diagnosis not present

## 2022-10-05 DIAGNOSIS — R269 Unspecified abnormalities of gait and mobility: Secondary | ICD-10-CM | POA: Diagnosis not present

## 2022-10-10 ENCOUNTER — Other Ambulatory Visit (HOSPITAL_COMMUNITY): Payer: Self-pay

## 2022-10-10 DIAGNOSIS — E8881 Metabolic syndrome: Secondary | ICD-10-CM | POA: Diagnosis not present

## 2022-10-10 DIAGNOSIS — E559 Vitamin D deficiency, unspecified: Secondary | ICD-10-CM | POA: Diagnosis not present

## 2022-10-10 DIAGNOSIS — E88819 Insulin resistance, unspecified: Secondary | ICD-10-CM | POA: Diagnosis not present

## 2022-10-10 DIAGNOSIS — Z0000 Encounter for general adult medical examination without abnormal findings: Secondary | ICD-10-CM | POA: Diagnosis not present

## 2022-10-10 DIAGNOSIS — D649 Anemia, unspecified: Secondary | ICD-10-CM | POA: Diagnosis not present

## 2022-10-10 DIAGNOSIS — Z13220 Encounter for screening for lipoid disorders: Secondary | ICD-10-CM | POA: Diagnosis not present

## 2022-10-10 DIAGNOSIS — Z6837 Body mass index (BMI) 37.0-37.9, adult: Secondary | ICD-10-CM | POA: Diagnosis not present

## 2022-10-10 MED ORDER — BUPROPION HCL ER (SR) 150 MG PO TB12
150.0000 mg | ORAL_TABLET | Freq: Two times a day (BID) | ORAL | 1 refills | Status: DC
  Filled 2022-10-10 – 2022-11-16 (×2): qty 180, 90d supply, fill #0
  Filled 2023-02-09: qty 180, 90d supply, fill #1

## 2022-10-17 NOTE — Progress Notes (Signed)
Chief Complaint:   OBESITY [name] is here to discuss her progress with her obesity treatment plan along with follow-up of her obesity related diagnoses. [name] is on keeping a food journal and adhering to recommended goals of 1500 calories and 80 protein and states she is following her eating plan approximately 50% of the time. [name] states she is walking, strength training 30 minutes 5-6 times per week.  Today's visit was #: 64 Starting weight: 191 lbs Starting date: 10/16/2018 Today's weight: 208 lbs Today's date: 10/04/2022 Total lbs lost to date: 0 Total lbs lost since last in-office visit: +3 lbs  Interim History: She is a Therapist, sports with inpatient cardiac rehab.  She came back from vacation and had Halloween, so she is up in weight. She stress eats sweets at night and is seeing a psychologist to work through these emotions.   Subjective:   1. Other depression, with emotional eating She is on Wellbutrin.  She saw her new therapist for the 3rd office visit, it is going well.  She is tolerating Wellbutrin and no side effects.  Feels its helping with her stress.   Assessment/Plan:  No orders of the defined types were placed in this encounter.   Medications Discontinued During This Encounter  Medication Reason   buPROPion (WELLBUTRIN SR) 150 MG 12 hr tablet Reorder     Meds ordered this encounter  Medications   DISCONTD: buPROPion (WELLBUTRIN SR) 150 MG 12 hr tablet    Sig: Take 1 tablet (150 mg total) by mouth 2 (two) times daily.    Dispense:  60 tablet    Refill:  0     1. Other depression, with emotional eating 1) Counseling done,  regularly.  2) Stress management strategies discussed with patient.  3) Refill Wellbutrin, no change in dose.   Refill- Buproprion (WELLBUTRIN SR) 150 MG 12 hr tablet; Take one tablet by mouth twice daily  Dispense: 60 tablet; Refill: 0 (side effects discussed)   2. Obesity: Currrent BMI 35.7 Use the lose it app to track intake and  become more mindful of what she eats.  Practice intentional eating and continue with emotional eating strategies.    [name] is currently in the action stage of change. As such, her goal is to continue with weight loss efforts. She has agreed to keeping a food journal and adhering to recommended goals of 1500 calories and 80 protein.   Exercise goals:  As is.   Behavioral modification strategies: increasing lean protein intake, decreasing simple carbohydrates, and planning for success.  [name] has agreed to follow-up with our clinic in 3 weeks. She was informed of the importance of frequent follow-up visits to maximize her success with intensive lifestyle modifications for her multiple health conditions.   Objective:   Blood pressure 110/74, pulse 75, temperature 98 F (36.7 C), height '5\' 4"'$  (1.626 m), weight 208 lb (94.3 kg), SpO2 97 %. Body mass index is 35.7 kg/m.  General: Cooperative, alert, well developed, in no acute distress. HEENT: Conjunctivae and lids unremarkable. Cardiovascular: Regular rhythm.  Lungs: Normal work of breathing. Neurologic: No focal deficits.   Lab Results  Component Value Date   CREATININE 0.73 12/22/2019   BUN 14 12/22/2019   NA 143 12/22/2019   K 4.9 12/22/2019   CL 106 12/22/2019   CO2 23 12/22/2019   Lab Results  Component Value Date   ALT 11 12/22/2019   AST 13 12/22/2019   ALKPHOS 54 12/22/2019   BILITOT 0.3  12/22/2019   Lab Results  Component Value Date   HGBA1C 4.7 10/04/2021   HGBA1C 4.7 (L) 12/22/2019   HGBA1C 5.0 10/16/2018   Lab Results  Component Value Date   INSULIN 6.0 05/27/2020   INSULIN 6.5 12/22/2019   INSULIN 3.6 10/16/2018   Lab Results  Component Value Date   TSH 3.890 12/22/2019   Lab Results  Component Value Date   CHOL 158 12/22/2019   HDL 51 12/22/2019   LDLCALC 96 12/22/2019   TRIG 54 12/22/2019   Lab Results  Component Value Date   VD25OH 55.4 05/27/2020   VD25OH 32.0 12/22/2019   VD25OH 46.9  05/05/2019   Lab Results  Component Value Date   WBC 6.4 12/22/2019   HGB 13.6 12/22/2019   HCT 40.2 12/22/2019   MCV 94 12/22/2019   PLT 242 12/22/2019   No results found for: "IRON", "TIBC", "FERRITIN"  Attestation Statements:   Reviewed by clinician on day of visit: allergies, medications, problem list, medical history, surgical history, family history, social history, and previous encounter notes.  I, Davy Pique, RMA, am acting as Location manager for [employer], DO.   I have reviewed the above documentation for accuracy and completeness, and I agree with the above. Marjory Sneddon, D.O.  The [place] was signed into law in 2016 which includes the topic of electronic health records.  This provides immediate access to information in MyChart.  This includes consultation notes, operative notes, office notes, lab results and pathology reports.  If you have any questions about what you read please let us know at your next visit so we can discuss your concerns and take corrective action if need be.  We are right here with you.

## 2022-10-23 ENCOUNTER — Ambulatory Visit (INDEPENDENT_AMBULATORY_CARE_PROVIDER_SITE_OTHER): Payer: 59 | Admitting: Behavioral Health

## 2022-10-23 DIAGNOSIS — F4323 Adjustment disorder with mixed anxiety and depressed mood: Secondary | ICD-10-CM

## 2022-10-23 NOTE — Progress Notes (Signed)
[place] Counselor/Therapist Progress Note  Patient ID: [name], MRN: [id number],    Date: 10/23/2022  Time Spent: 58 min Caregility video; Pt is in her car in private & Provider is in Home Office   Treatment Type: Individual Therapy  Reported Symptoms: reduction in anx/dep  Mental Status Exam: Appearance:  Casual     Behavior: Appropriate and Sharing  Motor: Normal  Speech/Language:  Clear and Coherent  Affect: Appropriate  Mood: normal  Thought process: normal  Thought content:   WNL  Sensory/Perceptual disturbances:   WNL  Orientation: oriented to person, place, and time/date  Attention: Good  Concentration: Good  Memory: WNL  Fund of knowledge:  Good  Insight:   Good  Judgment:  Good  Impulse Control: Good   Risk Assessment: Danger to Self:  No Self-injurious Behavior: No Danger to Others: No Duty to Warn:no Physical Aggression / Violence:No  Access to Firearms a concern: No  Gang Involvement:No   Subjective: Dot is c/o less movement in her new position. She is trying to move more on her break & outside of work. She is aware of her 50yo Dtr's weight-related needs. Pt's prep for the foods during the week is time consuming & needs to be shared w/her Dtr.  Tamar wants to cont her weight loss goals & inform her Dtr of her abilities, limits, & responsibilities for her own health needs.   Interventions: Solution-Oriented/Positive Psychology and Ego-Supportive  Diagnosis:Adjustment disorder with mixed anxiety and depressed mood  Plan: Delois describes her healthcare needs for her children. She will cont to inform her of the health needs she has according to Family Hx & what she needs to pay attn to in the future.  Target Date: 11/22/2022  Progress: 4  Frequency: Twice monthly   Modality: Karna Abed is being assertive about  including her Husb Elta Guadeloupe on the [employer] health issues. She will try to encourage Dad to involve himself even  more w/the Family.  Target Date: 11/22/2022  Progress: 3  Frequency: Twice monthly   Modality: Boykin Reaper, LMFT

## 2022-10-23 NOTE — Progress Notes (Signed)
                Omunique Pederson L Ronica Vivian, LMFT 

## 2022-10-25 ENCOUNTER — Ambulatory Visit (INDEPENDENT_AMBULATORY_CARE_PROVIDER_SITE_OTHER): Payer: 59 | Admitting: Physician Assistant

## 2022-10-25 DIAGNOSIS — B0089 Other herpesviral infection: Secondary | ICD-10-CM | POA: Diagnosis not present

## 2022-10-25 DIAGNOSIS — D2371 Other benign neoplasm of skin of right lower limb, including hip: Secondary | ICD-10-CM | POA: Diagnosis not present

## 2022-10-25 DIAGNOSIS — L821 Other seborrheic keratosis: Secondary | ICD-10-CM | POA: Diagnosis not present

## 2022-10-25 DIAGNOSIS — D2272 Melanocytic nevi of left lower limb, including hip: Secondary | ICD-10-CM | POA: Diagnosis not present

## 2022-10-25 DIAGNOSIS — D2262 Melanocytic nevi of left upper limb, including shoulder: Secondary | ICD-10-CM | POA: Diagnosis not present

## 2022-10-25 DIAGNOSIS — D225 Melanocytic nevi of trunk: Secondary | ICD-10-CM | POA: Diagnosis not present

## 2022-11-08 ENCOUNTER — Ambulatory Visit (INDEPENDENT_AMBULATORY_CARE_PROVIDER_SITE_OTHER): Payer: 59 | Admitting: Behavioral Health

## 2022-11-08 DIAGNOSIS — F4323 Adjustment disorder with mixed anxiety and depressed mood: Secondary | ICD-10-CM

## 2022-11-08 NOTE — Progress Notes (Signed)
[place] Counselor/Therapist Progress Note  Patient ID: [name], MRN: [id number],    Date: 11/08/2022  Time Spent: 12 min In Person @ [facility] Office   Treatment Type: Individual Therapy  Reported Symptoms: Dec in anx/dep due to changes she has made in her mindset. Pt reports wanting to separate her anxiety from her food/eating & weight issues.  Mental Status Exam: Appearance:  Neat   Coming to appt from work  Behavior: Appropriate and Sharing  Motor: Normal  Speech/Language:  Clear and Coherent and Normal Rate  Affect: Appropriate  Mood: normal  Thought process: normal  Thought content:   WNL  Sensory/Perceptual disturbances:   WNL  Orientation: oriented to person, place, and time/date  Attention: Good  Concentration: Good  Memory: WNL  Fund of knowledge:  Good  Insight:   Good  Judgment:  Good  Impulse Control: Good   Risk Assessment: Danger to Self:  No Self-injurious Behavior: No Danger to Others: No Duty to Warn:no Physical Aggression / Violence:No  Access to Firearms a concern: No  Gang Involvement:No   Subjective: Pt reports positive strides in her efforts w/her weight issues. She wants her visit w/her Mother for 2 wks to go well, but her Mother has focused on her own & Pt's weight her whole life. Pt is weary of this.  In the home, Pt's Husb has been contributing more & she feels he is present. Things are going well & she wants them to keep going well.    Interventions: Solution-Oriented/Positive Psychology  Diagnosis:Adjustment disorder with mixed anxiety and depressed mood  Plan: [name] wants the next 2 wks to be positive w/her Mother. She will duplicate her efforts w/her mindset so she can be more positive & brush off her Mother's cinsensitive comments about her weight.   Target Date: 12/11/2022  Progress: 6  Frequency: Twice monthly  Modality: Boykin Reaper, LMFT

## 2022-11-08 NOTE — Progress Notes (Signed)
                Rachyl Wuebker L Rhilynn Preyer, LMFT 

## 2022-11-16 ENCOUNTER — Other Ambulatory Visit (HOSPITAL_COMMUNITY): Payer: Self-pay

## 2022-11-22 ENCOUNTER — Other Ambulatory Visit: Payer: Self-pay | Admitting: *Deleted

## 2022-11-22 DIAGNOSIS — M79604 Pain in right leg: Secondary | ICD-10-CM

## 2022-12-08 ENCOUNTER — Encounter (HOSPITAL_COMMUNITY): Payer: Self-pay

## 2022-12-12 ENCOUNTER — Ambulatory Visit: Payer: 59 | Admitting: Behavioral Health

## 2022-12-12 DIAGNOSIS — F4323 Adjustment disorder with mixed anxiety and depressed mood: Secondary | ICD-10-CM | POA: Diagnosis not present

## 2022-12-12 NOTE — Progress Notes (Signed)
[place] Counselor/Therapist Progress Note  Patient ID: [name], MRN: [id number],    Date: 12/12/2022  Time Spent: 80 min In Person @ [facility] Office   Treatment Type: Individual Therapy  Reported Symptoms: Reduction in anx/dep & stress due to past work adjustment issues. Husb has maintained his efforts in the home & Pt is happy he has not defaulted back to his older habit. Pt is less focused on her weight & now more on lifestyle & her Family's needs. She is concerned for her 51yo Dtr challenged by ADHD & weight, but has put resources in place for her to thrive.   Mental Status Exam: Appearance:  Casual and Neat     Behavior: Appropriate and Sharing  Motor: Normal  Speech/Language:  Clear and Coherent and Normal Rate  Affect: Appropriate  Mood: normal  Thought process: normal  Thought content:   WNL  Sensory/Perceptual disturbances:   WNL  Orientation: WNL; to person, place, & situation  Attention: Good  Concentration: Good  Memory: WNL  Fund of knowledge:  Good  Insight:   Good  Judgment:  Good  Impulse Control: Good   Risk Assessment: Danger to Self:  No Self-injurious Behavior: No Danger to Others: No Duty to Warn:no Physical Aggression / Violence:No  Access to Firearms a concern: No  Gang Involvement:No   Subjective: Pt is well today & most concerned for her Dtr's current sense of self. She sees a lot of anxiety in her Dtr she also exp'd & wants to do right by her.    Interventions: Family Systems and SFBT  Diagnosis:Adjustment disorder with mixed anxiety and depressed mood  Plan: [name] is learning more about herself & trying to do the best she can for her children. She will promote good communication, life skills for independence & meet the emot'l needs she can identify. [name] will not go into "fix it" mode. Instead, she will try to coach her children to begin to rely on their skills to fix problems.   Target Date:  01/12/2023  Progress: 6  Frequency: Twice monthly  Modality: Boykin Reaper, LMFT

## 2022-12-12 NOTE — Progress Notes (Signed)
                Ina Poupard L Taite Baldassari, LMFT 

## 2023-01-01 ENCOUNTER — Ambulatory Visit (INDEPENDENT_AMBULATORY_CARE_PROVIDER_SITE_OTHER): Payer: 59 | Admitting: Behavioral Health

## 2023-01-01 DIAGNOSIS — F4323 Adjustment disorder with mixed anxiety and depressed mood: Secondary | ICD-10-CM | POA: Diagnosis not present

## 2023-01-01 NOTE — Progress Notes (Signed)
                Yasenia Reedy L Dalton Mille, LMFT 

## 2023-01-01 NOTE — Progress Notes (Signed)
[place] Counselor/Therapist Progress Note  Patient ID: [name], MRN: [id number],    Date: 01/01/2023  Time Spent: 51 min In Person @ [facility] Office   Treatment Type: Individual Therapy  Reported Symptoms: Reduction in anx/dep & stress due to sustained improvements in the Family Syst, esp'ly Husb Mark's participation  Mental Status Exam: Appearance:  Casual     Behavior: Appropriate and Sharing  Motor: Normal  Speech/Language:  Clear and Coherent  Affect: Appropriate  Mood: normal  Thought process: normal  Thought content:   WNL  Sensory/Perceptual disturbances:   WNL  Orientation: oriented to person, place, and time/date  Attention: Good  Concentration: Good  Memory: WNL  Fund of knowledge:  Good  Insight:   Good  Judgment:  Good  Impulse Control: Good   Risk Assessment: Danger to Self:  No Self-injurious Behavior: No Danger to Others: No Duty to Warn:no Physical Aggression / Violence:No  Access to Firearms a concern: No  Gang Involvement:No   Subjective: Pt is upbeat & confident today. Her Family is sustaining the changes that have occurred & her marriage is more stable. Pt is worrying less & not as stressed or anxious w/the responsibilities of a busy FT working Mother.    Interventions: Family Systems  Diagnosis:Adjustment disorder with mixed anxiety and depressed mood  Plan: [name] is satisfied the changes in her marriage are lasting. She will schedule an appt in one month w/the understanding she can cancel w/in 24 hrs if she feels it is unnecessary. [name] is aware she can call in the future if she feels psychotherapy is indicated.  Target Date: 01/30/2023  Progress: 9  Frequency: Once monthly & probable termination of sessions  Modality: Boykin Reaper, LMFT

## 2023-01-03 DIAGNOSIS — Z1231 Encounter for screening mammogram for malignant neoplasm of breast: Secondary | ICD-10-CM | POA: Diagnosis not present

## 2023-01-30 ENCOUNTER — Ambulatory Visit (INDEPENDENT_AMBULATORY_CARE_PROVIDER_SITE_OTHER): Payer: 59 | Admitting: Behavioral Health

## 2023-01-30 DIAGNOSIS — F4323 Adjustment disorder with mixed anxiety and depressed mood: Secondary | ICD-10-CM

## 2023-01-30 NOTE — Progress Notes (Signed)
                Ginni Eichler L Suesan Mohrmann, LMFT 

## 2023-01-30 NOTE — Progress Notes (Signed)
[place] Counselor/Therapist Progress Note  Patient ID: [name], MRN: [id number],    Date: 01/30/2023  Time Spent: 60 min In Person @ [facility]   Treatment Type: Individual Therapy  Reported Symptoms: Elevated anx/dep & anger for issues @ work that are irritating & keep arising w/no resolution  Mental Status Exam: Appearance:  Casual     Behavior: Appropriate and Sharing  Motor: Normal  Speech/Language:  Clear and Coherent  Affect: Appropriate  Mood: normal  Thought process: normal  Thought content:   WNL  Sensory/Perceptual disturbances:   WNL  Orientation: oriented to person, place, and time/date  Attention: Good  Concentration: Good  Memory: WNL  Fund of knowledge:  Good  Insight:   Good  Judgment:  Good  Impulse Control: Good   Risk Assessment: Danger to Self:  No Self-injurious Behavior: No Danger to Others: No Duty to Warn:no Physical Aggression / Violence:No  Access to Firearms a concern: No  Gang Involvement:No   Subjective: Pt cont's to be content w/the manner in which her home life environ has improved & the contributions [name] is making.   Pt is concerned for resolution of work issues.  Pt wants to work on her anger next session & explore her Hx in childhood of trauma.   Interventions: Solution-Oriented/Positive Psychology and Ego-Supportive  Diagnosis:Adjustment disorder with mixed anxiety and depressed mood  Plan: [name] is concerned for anger rxn internally & wants coping skills to remedy this. Provided resources & tools Pt can use @ home to practice. We will take an inventory of her type of anger, provide psychoedu re: anger & explore this further next session.  Target Date: 03/02/2023  Progress: 5 - Pt was very vocal today & sharing  Frequency: Twice monthly  Modality: Sheila Reaper, LMFT

## 2023-02-09 ENCOUNTER — Other Ambulatory Visit (HOSPITAL_COMMUNITY): Payer: Self-pay

## 2023-02-09 ENCOUNTER — Encounter (HOSPITAL_COMMUNITY): Payer: Self-pay

## 2023-02-12 ENCOUNTER — Other Ambulatory Visit (HOSPITAL_COMMUNITY): Payer: Self-pay

## 2023-02-26 ENCOUNTER — Ambulatory Visit (INDEPENDENT_AMBULATORY_CARE_PROVIDER_SITE_OTHER): Payer: 59 | Admitting: Behavioral Health

## 2023-02-26 DIAGNOSIS — F4323 Adjustment disorder with mixed anxiety and depressed mood: Secondary | ICD-10-CM | POA: Diagnosis not present

## 2023-02-26 NOTE — Progress Notes (Signed)
                Zenab Gronewold L Lauran Romanski, LMFT 

## 2023-02-26 NOTE — Progress Notes (Addendum)
[place] Counselor/Therapist Progress Note  Patient ID: [name], MRN: [id number],    Date: 02/26/2023  Time Spent: 36 min In Person @ [facility] Office   Treatment Type: Individual Therapy  Reported Symptoms: Some anx/dep & stress levels due to changes w/her work envir that may not suit her needs.   Mental Status Exam: Appearance:  Casual     Behavior: Appropriate and Sharing  Motor: Normal  Speech/Language:  Clear and Coherent and Normal Rate  Affect: Appropriate  Mood: normal  Thought process: normal  Thought content:   WNL  Sensory/Perceptual disturbances:   WNL  Orientation: oriented to person, place, and time/date  Attention: Good  Concentration: Good  Memory: WNL  Fund of knowledge:  Good  Insight:   Good  Judgment:  Good  Impulse Control: Good   Risk Assessment: Danger to Self:  No Self-injurious Behavior: No Danger to Others: No Duty to Warn:no Physical Aggression / Violence:No  Access to Firearms a concern: No  Gang Involvement:No   Subjective: Pt is upset over changes she has been told about @ work. Her schedule & focus may be altered & she is content w/her current protocol. She has voiced her concerns to her immed Supervisor & hopes it will settle out in a positive way.   Pt's homelife cont's to be working well w/Husb [name] more engaged & helpful & the Teens doing fairly well.   Interventions: Insight-Oriented and Family Systems  Pt completed an Anger Assessment: 'Personal Anger Assessment' by Dr. Nelda Severe in his book: 'Anger: Taming a Powerful Emotion'.  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 She is struggling to not let  her frustation get the best of her bc she likes the work routine. She will f/u w/her Supervisor & cont to offer suggestions that are viable. She welcomes the feedback from her Anger Inventory next session.   Target Date: 03/26/2023  Progress: 7  Frequency: Once every 3-4 wks  Modality: Boykin Reaper, LMFT

## 2023-02-28 ENCOUNTER — Other Ambulatory Visit (HOSPITAL_COMMUNITY): Payer: Self-pay

## 2023-02-28 ENCOUNTER — Telehealth: Payer: 59 | Admitting: Nurse Practitioner

## 2023-02-28 DIAGNOSIS — B369 Superficial mycosis, unspecified: Secondary | ICD-10-CM | POA: Diagnosis not present

## 2023-02-28 MED ORDER — NYSTATIN 100000 UNIT/GM EX POWD
1.0000 | Freq: Three times a day (TID) | CUTANEOUS | 0 refills | Status: DC
  Filled 2023-02-28: qty 15, 5d supply, fill #0

## 2023-02-28 NOTE — Progress Notes (Signed)
E Visit for Rash  We are sorry that you are not feeling well. Here is how we plan to help!   Based upon your presentation it appears you have a fungal infection.  I have prescribed: and Nystatin powder apply to the affected area three times daily  Try to keep the skin in that area dry     HOME CARE:  Take cool showers and avoid direct sunlight. Keep skin dry  Wear a cotton undershirt  Changes clothes frequently if sweaty   GET HELP RIGHT AWAY IF:  Symptoms don't go away after treatment. Severe itching that persists. If you rash spreads or swells. If you rash begins to smell. If it blisters and opens or develops a yellow-brown crust. You develop a fever. You have a sore throat. You become short of breath.  MAKE SURE YOU:  Understand these instructions. Will watch your condition. Will get help right away if you are not doing well or get worse.  Thank you for choosing an e-visit.  Your e-visit answers were reviewed by a board certified advanced clinical practitioner to complete your personal care plan. Depending upon the condition, your plan could have included both over the counter or prescription medications.  Please review your pharmacy choice. Make sure the pharmacy is open so you can pick up prescription now. If there is a problem, you may contact your provider through [employer] and have the prescription routed to another pharmacy.  Your safety is important to [place]. If you have drug allergies check your prescription carefully.   For the next 24 hours you can use MyChart to ask questions about today's visit, request a non-urgent call back, or ask for a work or school excuse. You will get an email in the next two days asking about your experience. I hope that your e-visit has been valuable and will speed your recovery.   Meds ordered this encounter  Medications   nystatin (MYCOSTATIN/NYSTOP) powder    Sig: Apply 1 Application topically 3 (three) times daily.     Dispense:  15 g    Refill:  0    I spent approximately 5 minutes reviewing the patient's history, current symptoms and coordinating their care today.

## 2023-03-26 ENCOUNTER — Ambulatory Visit (INDEPENDENT_AMBULATORY_CARE_PROVIDER_SITE_OTHER): Payer: 59 | Admitting: Behavioral Health

## 2023-03-26 DIAGNOSIS — F4323 Adjustment disorder with mixed anxiety and depressed mood: Secondary | ICD-10-CM

## 2023-03-26 NOTE — Progress Notes (Signed)
                [name] L Martena Emanuele, LMFT 

## 2023-03-26 NOTE — Progress Notes (Signed)
[facility] Counselor/Therapist Progress Note  Patient ID: [name], MRN: [id number],    Date: 03/26/2023  Time Spent: 55 min In Person @ [facility] Office   Treatment Type: Individual Therapy  Reported Symptoms: Elevated anx/dep due to internal struggle due to Pt's childhood upbringing as it relates to her self-image, self-worth, self-awareness, & self-acceptance  Mental Status Exam: Appearance:  Casual     Behavior: Appropriate and Sharing  Motor: Normal  Speech/Language:  Clear and Coherent  Affect: Appropriate  Mood: normal  Thought process: normal  Thought content:   WNL  Sensory/Perceptual disturbances:   WNL  Orientation: oriented to person, place, and time/date  Attention: Good  Concentration: Good  Memory: WNL  Fund of knowledge:  Good  Insight:   Good  Judgment:  Good  Impulse Control: Good   Risk Assessment: Danger to Self:  No Self-injurious Behavior: No Danger to Others: No Duty to Warn:no Physical Aggression / Violence:No  Access to Firearms a concern: No  Gang Involvement:No   Subjective: [name] feels work is going well, but has its tedious moments. Home is also going well. Her Husb cont's his efforts & the children are helping out more-it feels like Teamwork which lightens her load & relieves her stress. Pt is most concerned for her upbringing & how this has impacted her sense of self. She knows she can be quick to anger & finds it difficult to accept any type of criticism. She feels she holds two images of herself; one as accomplished & another as never being enough.   Interventions: Insight-Oriented and Family Systems  Diagnosis:Adjustment disorder with mixed anxiety and depressed mood  Plan: [name] wants to address the trauma in her childhood from her Mother's Tx of her. She loves her Mother, but is quick to push back & reject her current beh when she is critical. [name] explains it is her role amongst her Siblings to be known  as, "the bitch". This anger has propelled her life & she wants to understand who she is more deeply & find self-acceptance. She will complete the 'Self-Guided Core Values Assessment' prior to our next session so we can discuss who she is @ her core more thoroughly.  Target Date: 04/26/2023  Progress: 5  Frequency: Once every 3-4 wks  Modality: Claretta Fraise, LMFT

## 2023-04-02 ENCOUNTER — Other Ambulatory Visit (HOSPITAL_COMMUNITY): Payer: Self-pay

## 2023-04-02 ENCOUNTER — Telehealth: Payer: 59 | Admitting: Physician Assistant

## 2023-04-02 DIAGNOSIS — B372 Candidiasis of skin and nail: Secondary | ICD-10-CM

## 2023-04-02 MED ORDER — FLUCONAZOLE 150 MG PO TABS
150.0000 mg | ORAL_TABLET | ORAL | 0 refills | Status: DC
Start: 2023-04-02 — End: 2023-09-24
  Filled 2023-04-02: qty 2, 4d supply, fill #0

## 2023-04-02 NOTE — Progress Notes (Signed)
I have spent 5 minutes in review of e-visit questionnaire, review and updating patient chart, medical decision making and response to patient.   Jazzlyn Huizenga Cody Shenise Wolgamott, PA-C    

## 2023-04-02 NOTE — Progress Notes (Signed)
E Visit for Rash  We are sorry that you are not feeling well. Here is how we plan to help!  I want you to continue keeping area clean and dry. Ok to resume use of the nystatin powder. I am send in a diflucan for you to take as directed.   HOME CARE:  Take cool showers and avoid direct sunlight. Apply cool compress or wet dressings. Take a bath in an oatmeal bath.  Sprinkle content of one Aveeno packet under running faucet with comfortably warm water.  Bathe for 15-20 minutes, 1-2 times daily.  Pat dry with a towel. Do not rub the rash. Use hydrocortisone cream. Take an antihistamine like Benadryl for widespread rashes that itch.  The adult dose of Benadryl is 25-50 mg by mouth 4 times daily. Caution:  This type of medication may cause sleepiness.  Do not drink alcohol, drive, or operate dangerous machinery while taking antihistamines.  Do not take these medications if you have prostate enlargement.  Read package instructions thoroughly on all medications that you take.  GET HELP RIGHT AWAY IF:  Symptoms don't go away after treatment. Severe itching that persists. If you rash spreads or swells. If you rash begins to smell. If it blisters and opens or develops a yellow-brown crust. You develop a fever. You have a sore throat. You become short of breath.  MAKE SURE YOU:  Understand these instructions. Will watch your condition. Will get help right away if you are not doing well or get worse.  Thank you for choosing an e-visit.  Your e-visit answers were reviewed by a board certified advanced clinical practitioner to complete your personal care plan. Depending upon the condition, your plan could have included both over the counter or prescription medications.  Please review your pharmacy choice. Make sure the pharmacy is open so you can pick up prescription now. If there is a problem, you may contact your provider through [employer] and have the prescription routed to another  pharmacy.  Your safety is important to [place]. If you have drug allergies check your prescription carefully.   For the next 24 hours you can use MyChart to ask questions about today's visit, request a non-urgent call back, or ask for a work or school excuse. You will get an email in the next two days asking about your experience. I hope that your e-visit has been valuable and will speed your recovery.

## 2023-04-17 ENCOUNTER — Ambulatory Visit (INDEPENDENT_AMBULATORY_CARE_PROVIDER_SITE_OTHER): Payer: 59 | Admitting: Behavioral Health

## 2023-04-17 DIAGNOSIS — F4323 Adjustment disorder with mixed anxiety and depressed mood: Secondary | ICD-10-CM | POA: Diagnosis not present

## 2023-04-17 NOTE — Progress Notes (Signed)
[facility] Counselor/Therapist Progress Note  Patient ID: [name], MRN: [id number],    Date: 04/17/2023  Time Spent: 55 min In Person @ [facility] Office   Treatment Type: Individual Therapy  Reported Symptoms: Reduction in anx/dep & stress since Husb Mark & Family (Violet & Hornick) are pitching in to make life around home more bearable. 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  Mental Status Exam: Appearance:  Casual     Behavior: Appropriate and Sharing  Motor: Normal  Speech/Language:  Clear and Coherent  Affect: Appropriate  Mood: normal  Thought process: normal  Thought content:   WNL  Sensory/Perceptual disturbances:   WNL  Orientation: oriented to person, place, and time/date  Attention: Good  Concentration: Good  Memory: WNL  Fund of knowledge:  Good  Insight:   Good  Judgment:  Good  Impulse Control: Good   Risk Assessment: Danger to Self:  No Self-injurious Behavior: No Danger to Others: No Duty to Warn:no Physical Aggression / Violence:No  Access to Firearms a concern: No  Gang Involvement:No   Subjective: Pt has completed her Core Values Assessment. She describes her process & how her top 3 are: Family, Justice, & Love. She related a conversation w/her Dtr about a friend who has been acting badly & excluding her Dtr. Pt hopes she will have a conversation w/her friend to clear up & clarify her jurt feelings prior to the start of Summer. Pt wants her Dtr to feel "empowerment".  Pt wants to understand herself better & how her neg self-talk can be transformed into a calmer, more peaceful existence with herself.    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 She will speak her mind & always minds her "fiery temper". Her own Mom quit many jobs bc she  did not temper her anger well. [name] will cont to take note of her own needs, implement self-care as she is able & remind herself of accomplishments & being enough.  Target Date: 05/27/2023  Progress: 7  Frequency: Once every 3-4 wks  Modality: Claretta Fraise, LMFT

## 2023-04-17 NOTE — Progress Notes (Signed)
                Dorin Stooksbury L Anniah Glick, LMFT 

## 2023-04-19 DIAGNOSIS — G5603 Carpal tunnel syndrome, bilateral upper limbs: Secondary | ICD-10-CM | POA: Diagnosis not present

## 2023-05-08 ENCOUNTER — Ambulatory Visit (INDEPENDENT_AMBULATORY_CARE_PROVIDER_SITE_OTHER): Payer: 59 | Admitting: Behavioral Health

## 2023-05-08 DIAGNOSIS — F4323 Adjustment disorder with mixed anxiety and depressed mood: Secondary | ICD-10-CM

## 2023-05-08 NOTE — Progress Notes (Unsigned)
                Meric Joye L Adely Facer, LMFT 

## 2023-05-09 NOTE — Progress Notes (Signed)
[facility] Counselor/Therapist Progress Note  Patient ID: [name], MRN: [id number],    Date: 05/09/2023  Time Spent: 55 min In Person @ [facility] Office   Treatment Type: Individual Therapy  Reported Symptoms: Cont'd reduction in anx/dep & stress.   Mental Status Exam: Appearance:  Casual     Behavior: Appropriate and Sharing  Motor: Normal  Speech/Language:  Clear and Coherent  Affect: Appropriate  Mood: Upbeat & positive  Thought process: normal  Thought content:   WNL  Sensory/Perceptual disturbances:   WNL  Orientation: oriented to person, place, and time/date  Attention: Good  Concentration: Good  Memory: WNL  Fund of knowledge:  Good  Insight:   Good  Judgment:  Good  Impulse Control: Good   Risk Assessment: Danger to Self:  No Self-injurious Behavior: No Danger to Others: No Duty to Warn:no Physical Aggression / Violence:No  Access to Firearms a concern: No  Gang Involvement:No   Subjective: Pt is concerned for her neg self-talk & her neg self-image which she feels manifested in her Mother's early concern for her & Str's weight. She is practical about her efforts to lose weight now & get Px'ly fit. She is helping her entire Family in healthy fit wellness efforts.  Pt processed through some of her early childhood @ the age of 11-70yrs old. She does not want to project any negativity onto her own Dtr. She wants her Dtr to have a healthy self-image & be confident.   Interventions: Insight-Oriented and Family Systems  Diagnosis:Adjustment disorder with mixed anxiety and depressed mood  Plan: Ashleigh is doing & feeling like she is taking charge of her health & making good decisions about her lifestyle. She & Husb Loraine Leriche are working tgthr to set a good example for their Son & Dtr. She looks forward to the rest of this week as they are in [place] on a Mission Trip. She has even declined invites from friends this week bc she wants to spend time w/her  Husb. She has improved boundaries in many settings, including work. She will cont to note other improvements for our next session noting when she is kind to herself.  Target Date: 05/28/2023  Progress: 7  Frequency: Once every 3-4 wks  Modality: Claretta Fraise, LMFT

## 2023-05-19 ENCOUNTER — Other Ambulatory Visit (HOSPITAL_COMMUNITY): Payer: Self-pay

## 2023-05-21 ENCOUNTER — Other Ambulatory Visit (HOSPITAL_COMMUNITY): Payer: Self-pay

## 2023-05-21 MED ORDER — BUPROPION HCL ER (SR) 150 MG PO TB12
150.0000 mg | ORAL_TABLET | Freq: Two times a day (BID) | ORAL | 1 refills | Status: DC
  Filled 2023-05-21: qty 180, 90d supply, fill #0
  Filled 2023-08-24: qty 180, 90d supply, fill #1

## 2023-05-22 ENCOUNTER — Other Ambulatory Visit: Payer: Self-pay

## 2023-06-12 ENCOUNTER — Ambulatory Visit (INDEPENDENT_AMBULATORY_CARE_PROVIDER_SITE_OTHER): Payer: 59 | Admitting: Behavioral Health

## 2023-06-12 DIAGNOSIS — F4323 Adjustment disorder with mixed anxiety and depressed mood: Secondary | ICD-10-CM

## 2023-06-12 NOTE — Progress Notes (Signed)
                Victoria L Winstead, LMFT 

## 2023-06-20 NOTE — Progress Notes (Signed)
[facility] [place] [facility] Counselor/Therapist Progress Note  Patient ID: [name], MRN: [id number],    Date: 06/20/2023  Time Spent: 55 min In Person @ [facility] Time In: 4:00pm Time Out: 4:55pm  Treatment Type: Individual Therapy  Reported Symptoms: Reduction in anx/dep & stress as Family has become her priority & she is examining her beh'l habits to improve her life further.   Mental Status Exam: Appearance:  Casual   Pt attends session directly after work  Behavior: Appropriate and Sharing  Motor: Normal  Speech/Language:  Clear and Coherent and Normal Rate  Affect: Appropriate  Mood: anxious  Thought process: normal  Thought content:   WNL  Sensory/Perceptual disturbances:   WNL  Orientation: oriented to person, place, time/date, and situation  Attention: Good  Concentration: Good  Memory: WNL  Fund of knowledge:  Good  Insight:   Fair  Judgment:  Good  Impulse Control: Fair   Risk Assessment: Danger to Self:  No Self-injurious Behavior: No Danger to Others: No Duty to Warn:no Physical Aggression / Violence:No  Access to Firearms a concern: No  Gang Involvement:No   Subjective: Pt has been taking time for herself & this Summer has been restful. She has had the mind time to think about the future & what she hopes to do. She loves Mission Work & will try to explore the options for this.  She hopes to set intentions & follow through.    Interventions: Solution-Oriented/Positive Psychology, Insight-Oriented, and Family Systems  Diagnosis:Adjustment disorder with mixed anxiety and depressed mood  Plan: Lora c/o being fretful & reminding herself to "back off since I to to the worst case scenario". She will set several intentions for herself  in the Notebook she is keeping & check in 30 days to see if she has followed through, been consistent, & if there is need to revise her intentions. Target Date: 07/12/2023 Progress: 2 on this task Frequency:  Once every 3-4 wks   Modality: Claretta Fraise, LMFT

## 2023-06-30 ENCOUNTER — Other Ambulatory Visit (HOSPITAL_COMMUNITY): Payer: Self-pay

## 2023-07-04 ENCOUNTER — Ambulatory Visit: Payer: 59 | Admitting: Behavioral Health

## 2023-07-04 DIAGNOSIS — F4323 Adjustment disorder with mixed anxiety and depressed mood: Secondary | ICD-10-CM | POA: Diagnosis not present

## 2023-07-04 NOTE — Progress Notes (Signed)
[facility] Counselor/Therapist Progress Note  Patient ID: [name], MRN: [id number],    Date: 07/06/2023  Time Spent: 55 min In Person @ [facility] Time In: 4:00pm Time Out: 4:55pm   Treatment Type: Individual Therapy  Reported Symptoms: Reduction in anx/dep & stress. Home situation is stable & Husb has maintained his engagement. Cpl is taking inc'd time for themselves & teens are doing better.  Mental Status Exam: Appearance:  Casual     Behavior: Appropriate and Sharing  Motor: Normal  Speech/Language:  Clear and Coherent  Affect: Appropriate  Mood: normal  Thought process: normal  Thought content:   WNL  Sensory/Perceptual disturbances:   WNL  Orientation: oriented to person, place, time/date, and situation  Attention: Good  Concentration: Good  Memory: WNL  Fund of knowledge:  Good  Insight:   Good  Judgment:  Good  Impulse Control: Good   Risk Assessment: Danger to Self:  No Self-injurious Behavior: No Danger to Others: No Duty to Warn:no Physical Aggression / Violence:No  Access to Firearms a concern: No  Gang Involvement:No   Subjective: Pt yelled @ her Teen Dtr [name] over the [employer]. This caused her guilt & she has since apologized. She is making efforts not to treat her children like she was treated growing up. She wants her children to be safe in the world & be able to tell her anything. Pt was expected to be independent from a very young age.  Interventions: Solution-Oriented/Positive Psychology and Family Systems  Diagnosis:Adjustment disorder with mixed anxiety and depressed mood  Plan: Discussed termination of psychotherapy @ this time. [name] is doing well. She will call [facility] as she needs to in the future. She is also aware she can schedule Cpl Th in the future if needed.  Target Date: TBD  Progress: 9  Frequency: prn  Modality: Claretta Fraise, LMFT

## 2023-07-04 NOTE — Progress Notes (Signed)
                 L , LMFT 

## 2023-07-16 ENCOUNTER — Other Ambulatory Visit (HOSPITAL_COMMUNITY): Payer: Self-pay

## 2023-07-16 ENCOUNTER — Telehealth: Payer: 59 | Admitting: Family Medicine

## 2023-07-16 ENCOUNTER — Other Ambulatory Visit: Payer: Self-pay

## 2023-07-16 DIAGNOSIS — M62838 Other muscle spasm: Secondary | ICD-10-CM

## 2023-07-16 MED ORDER — METHOCARBAMOL 500 MG PO TABS
500.0000 mg | ORAL_TABLET | Freq: Three times a day (TID) | ORAL | 0 refills | Status: AC | PRN
Start: 2023-07-16 — End: ?
  Filled 2023-07-16 (×2): qty 30, 10d supply, fill #0

## 2023-07-16 NOTE — Patient Instructions (Addendum)
Marvis Repress, thank you for joining Freddy Finner, NP for today's virtual visit.  While this provider is not your primary care provider (PCP), if your PCP is located in our provider database this encounter information will be shared with them immediately following your visit.   A [place] MyChart account gives you access to today's visit and all your visits, tests, and labs performed at [facility] " click here if you don't have a [facility] MyChart account or go to [email or web address]  Consent: (Patient) [name] provided verbal consent for this virtual visit at the beginning of the encounter.  Current Medications:  Current Outpatient Medications:    methocarbamol (ROBAXIN) 500 MG tablet, Take 1 tablet (500 mg total) by mouth every 8 (eight) hours as needed for muscle spasms., Disp: 30 tablet, Rfl: 0   buPROPion (WELLBUTRIN SR) 150 MG 12 hr tablet, Take 1 tablet (150 mg total) by mouth 2 (two) times daily., Disp: 180 tablet, Rfl: 1   Cholecalciferol (VITAMIN D) 125 MCG (5000 UT) CAPS, One cap every other day, Disp: 30 capsule, Rfl: 0   cycloSPORINE (RESTASIS) 0.05 % ophthalmic emulsion, Instill 1 drop into both eyes twice a day as directed, Disp: 180 each, Rfl: 4   fluconazole (DIFLUCAN) 150 MG tablet, Take 1 tablet (150 mg total) by mouth once. May dose repeat in 3 days, Disp: 2 tablet, Rfl: 0   valACYclovir (VALTREX) 1000 MG tablet, Take 2 tablets in the morning and 2 tablets in the evening for 1 day at first signs of flare., Disp: 20 tablet, Rfl: 2   Medications ordered in this encounter:  Meds ordered this encounter  Medications   methocarbamol (ROBAXIN) 500 MG tablet    Sig: Take 1 tablet (500 mg total) by mouth every 8 (eight) hours as needed for muscle spasms.    Dispense:  30 tablet    Refill:  0    Order Specific Question:   Supervising Provider    Answer:   Merrilee Jansky [id number]     *If you need refills on other medications prior to  your next appointment, please contact your pharmacy*  Follow-Up: Call back or seek an in-person evaluation if the symptoms worsen or if the condition fails to improve as anticipated.  [facility] [phone]  Other Instructions Rotator Cuff Tear Rehab After Surgery, Adult You will learn about taking care of yourself after surgery. To view the content, go to this web address: [email or web address]  This video will expire on: 05/20/2025. If you need access to this video following this date, please reach out to the healthcare provider who assigned it to you. This information is not intended to replace advice given to you by your health care provider. Make sure you discuss any questions you have with your health care provider. Elsevier Patient Education  2024 Elsevier Inc.   If you have been instructed to have an in-person evaluation today at a local Urgent Care facility, please use the link below. It will take you to a list of all of our available [facility], including address, phone number and hours of operation. Please do not delay care.  [facility]  If you or a family member do not have a primary care provider, use the link below to schedule a visit and establish care. When you choose a [place] primary care physician or advanced practice provider, you gain a long-term partner in health. Find a Primary Care  Provider  Learn more about [name]'s in-office and virtual care options: [place] - Get Care Now

## 2023-07-16 NOTE — Progress Notes (Signed)
Virtual Visit Consent   [name], you are scheduled for a virtual visit with a [facility] provider today. Just as with appointments in the office, your consent must be obtained to participate. Your consent will be active for this visit and any virtual visit you may have with one of our providers in the next 365 days. If you have a MyChart account, a copy of this consent can be sent to you electronically.  As this is a virtual visit, video technology does not allow for your provider to perform a traditional examination. This may limit your provider's ability to fully assess your condition. If your provider identifies any concerns that need to be evaluated in person or the need to arrange testing (such as labs, EKG, etc.), we will make arrangements to do so. Although advances in technology are sophisticated, we cannot ensure that it will always work on either your end or our end. If the connection with a video visit is poor, the visit may have to be switched to a telephone visit. With either a video or telephone visit, we are not always able to ensure that we have a secure connection.  By engaging in this virtual visit, you consent to the provision of healthcare and authorize for your insurance to be billed (if applicable) for the services provided during this visit. Depending on your insurance coverage, you may receive a charge related to this service.  I need to obtain your verbal consent now. Are you willing to proceed with your visit today? [name] has provided verbal consent on 07/16/2023 for a virtual visit (video or telephone). Freddy Finner, NP  Date: 07/16/2023 11:16 AM  Virtual Visit via Video Note   I, Freddy Finner, connected with  [name]  ([id number], [birth year]) on 07/16/23 at 11:15 AM EDT by a video-enabled telemedicine application and verified that I am speaking with the correct person using two identifiers.  Location: Patient: Virtual Visit Location  Patient: Home Provider: Virtual Visit Location Provider: Home Office   I discussed the limitations of evaluation and management by telemedicine and the availability of in person appointments. The patient expressed understanding and agreed to proceed.    History of Present Illness: [name] is a 52 y.o. who identifies as a female who was assigned female at birth, and is being seen today for shoulder pain- right side.  Friday started having right shoulder pain-tightness especially when parallel or above head or shoulder height. Pain level is 2-3/10 when not using. When using pain level is 7/10 -sharp shooting pains at times, and can notices some spasms and cramp sensation in traps and under the shoulder blade. Some tightness with neck going to left- but ROM is intact.  Of note  rotator cuff sx in 2017- Dr Ranell Patrick. Can not be seen until 07/26/2023. Does not feel like related to anything regarding that. Thought it was muscle spasms.    Problems:  Patient Active Problem List   Diagnosis Date Noted   Insulin resistance 06/02/2020   Depression 05/22/2019   Vitamin D deficiency 12/03/2018   Class 1 obesity with serious comorbidity and body mass index (BMI) of 32.0 to 32.9 in adult 12/03/2018    Allergies: No Known Allergies Medications:  Current Outpatient Medications:    buPROPion (WELLBUTRIN SR) 150 MG 12 hr tablet, Take 1 tablet (150 mg total) by mouth 2 (two) times daily., Disp: 180 tablet, Rfl: 1   Cholecalciferol (VITAMIN D) 125 MCG (5000 UT)  CAPS, One cap every other day, Disp: 30 capsule, Rfl: 0   cycloSPORINE (RESTASIS) 0.05 % ophthalmic emulsion, Instill 1 drop into both eyes twice a day as directed, Disp: 180 each, Rfl: 4   fluconazole (DIFLUCAN) 150 MG tablet, Take 1 tablet (150 mg total) by mouth once. May dose repeat in 3 days, Disp: 2 tablet, Rfl: 0   valACYclovir (VALTREX) 1000 MG tablet, Take 2 tablets in the morning and 2 tablets in the evening for 1 day at first signs of  flare., Disp: 20 tablet, Rfl: 2  Observations/Objective: Patient is well-developed, well-nourished in no acute distress.  Resting comfortably  at home.  Head is normocephalic, atraumatic.  No labored breathing.  Speech is clear and coherent with logical content.  Patient is alert and oriented at baseline.  ROM intact on video  Assessment and Plan:  1. Muscle spasm of right shoulder  - methocarbamol (ROBAXIN) 500 MG tablet; Take 1 tablet (500 mg total) by mouth every 8 (eight) hours as needed for muscle spasms.  Dispense: 30 tablet; Refill: 0  -reviewed med use and shoulder precautions -encouraged heat and rotator cuff strengthening  -avoid heavy lifting and massage until seen in person by ortho   Reviewed side effects, risks and benefits of medication.    Patient acknowledged agreement and understanding of the plan.   Past Medical, Surgical, Social History, Allergies, and Medications have been Reviewed.    Follow Up Instructions: I discussed the assessment and treatment plan with the patient. The patient was provided an opportunity to ask questions and all were answered. The patient agreed with the plan and demonstrated an understanding of the instructions.  A copy of instructions were sent to the patient via MyChart unless otherwise noted below.    The patient was advised to call back or seek an in-person evaluation if the symptoms worsen or if the condition fails to improve as anticipated.  Time:  I spent 10 minutes with the patient via telehealth technology discussing the above problems/concerns.    Freddy Finner, NP

## 2023-08-02 ENCOUNTER — Other Ambulatory Visit: Payer: Self-pay | Admitting: *Deleted

## 2023-08-02 ENCOUNTER — Encounter (HOSPITAL_COMMUNITY): Payer: 59

## 2023-08-02 DIAGNOSIS — M79605 Pain in left leg: Secondary | ICD-10-CM

## 2023-08-09 ENCOUNTER — Ambulatory Visit (HOSPITAL_COMMUNITY)
Admission: RE | Admit: 2023-08-09 | Discharge: 2023-08-09 | Disposition: A | Discharge status: Home / Self Care | Payer: 59 | Source: Ambulatory Visit | Attending: Vascular Surgery | Admitting: Vascular Surgery

## 2023-08-09 ENCOUNTER — Encounter: Payer: Self-pay | Admitting: Physician Assistant

## 2023-08-09 ENCOUNTER — Ambulatory Visit (INDEPENDENT_AMBULATORY_CARE_PROVIDER_SITE_OTHER): Payer: 59 | Admitting: Physician Assistant

## 2023-08-09 VITALS — BP 128/82 | HR 72 | Temp 98.3°F | Resp 18 | Ht 64.0 in | Wt 201.6 lb

## 2023-08-09 DIAGNOSIS — M7989 Other specified soft tissue disorders: Secondary | ICD-10-CM

## 2023-08-09 DIAGNOSIS — M79604 Pain in right leg: Secondary | ICD-10-CM | POA: Diagnosis not present

## 2023-08-09 DIAGNOSIS — M79605 Pain in left leg: Secondary | ICD-10-CM | POA: Diagnosis not present

## 2023-08-09 DIAGNOSIS — I872 Venous insufficiency (chronic) (peripheral): Secondary | ICD-10-CM

## 2023-08-09 NOTE — Progress Notes (Signed)
[facility]   Reason for referral: left posterior knee vein protrusion leg  History of Present Illness  [name] is a 52 y.o. female who presents with chief complaint: swollen leg.  Patient notes, onset of vein enlargement left posterior popliteal  months ago, associated with prolonged sitting and a job change that has in a seated position more often.  The patient has had no history of DVT, no history of varicose vein, no history of venous stasis ulcers, no history of  Lymphedema and no history of skin changes in lower legs.  There is unknown family history of venous disorders.  The patient has used OTC knee high compression stockings in the past.  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  She has noticed a bulging vein behind her left knee that is getting bigger in size and causes some aching.  She denies skin changes, edema or non healing wounds.  She denies claudication or rest pain.  She does walk for exercise 1-2 miles at a time.      Past Medical History:  Diagnosis Date   Anemia    Back pain    Depression    followed by Dr. Cherly Hensen   Gallbladder problem    GERD (gastroesophageal reflux disease)    History of kidney problems    1975, eval by Dr. 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 & LT renal compensatory hypertrophy w/ no evidence of obstruction & he felt no further invasive studies were needed    Kidney problem    Migraines    [facility]   Palpitations    Vitamin D deficiency     Past Surgical History:  Procedure Laterality Date   CESAREAN SECTION  2007 & 2009   CHOLECYSTECTOMY  2009   CYSTOSCOPY  age 69   SHOULDER ARTHROSCOPY WITH SUBACROMIAL DECOMPRESSION, ROTATOR CUFF REPAIR AND BICEP TENDON REPAIR Right 02/11/2016   Procedure: RIGHT SHOULDER ARTHROSCOPY WITH SUBACROMIAL DECOMPRESSION, OPEN DCR,  OPEN MINI  ROTATOR CUFF REPAIR, LABRAL REPAIR;  Surgeon: Beverely Low, MD;  Location: [facility] OR;  Service: Orthopedics;  Laterality: Right;   TUBAL LIGATION     uterine ablation      Social History   Socioeconomic History   Marital status: Married    Spouse name: Loraine Leriche   Number of children: 2   Years of education: Not on file   Highest education level: Not on file  Occupational History   Not on file  Tobacco Use   Smoking status: Former   Smokeless tobacco: Never  Vaping Use   Vaping status: Never Used  Substance and Sexual Activity   Alcohol use: Yes    Comment: rare   Drug use: No   Sexual activity: Not on file  Other Topics Concern   Not on file  Social History Narrative   Not on file   Social Determinants of Health   Financial Resource Strain: Not on file  Food Insecurity: Not on file  Transportation Needs: Not on file  Physical Activity: Not on file  Stress: Not on file  Social Connections: Not on file  Intimate Partner Violence: Not on file    Family History  Problem Relation Age of Onset   Hyperlipidemia Mother    Depression Mother    Cancer Mother    Anxiety disorder Mother    Obesity Mother  Migraines Father    Hypertension Father    Hyperlipidemia Father    Cancer Father    Bipolar disorder Sister    Diabetes Paternal Grandfather    Heart disease Paternal Grandfather     Current Outpatient Medications on File Prior to Visit  Medication Sig Dispense Refill   buPROPion (WELLBUTRIN SR) 150 MG 12 hr tablet Take 1 tablet (150 mg total) by mouth 2 (two) times daily. 180 tablet 1   Cholecalciferol (VITAMIN D) 125 MCG (5000 UT) CAPS One cap every other day 30 capsule 0   methocarbamol (ROBAXIN) 500 MG tablet Take 1 tablet (500 mg total) by mouth every 8 (eight) hours as needed for muscle spasms. 30 tablet 0   valACYclovir (VALTREX) 1000 MG tablet Take 2 tablets in the morning and 2 tablets in the evening for 1 day at first signs of flare. 20 tablet 2   cycloSPORINE  (RESTASIS) 0.05 % ophthalmic emulsion Instill 1 drop into both eyes twice a day as directed 180 each 4   fluconazole (DIFLUCAN) 150 MG tablet Take 1 tablet (150 mg total) by mouth once. May dose repeat in 3 days 2 tablet 0   No current facility-administered medications on file prior to visit.    Allergies as of 08/09/2023   (No Known Allergies)     ROS:   General:  No weight loss, Fever, chills  HEENT: No recent headaches, no nasal bleeding, no visual changes, no sore throat  Neurologic: No dizziness, blackouts, seizures. No recent symptoms of stroke or mini- stroke. No recent episodes of slurred speech, or temporary blindness.  Cardiac: No recent episodes of chest pain/pressure, no shortness of breath at rest.  No shortness of breath with exertion.  Denies history of atrial fibrillation or irregular heartbeat  Vascular: No history of rest pain in feet.  No history of claudication.  No history of non-healing ulcer, No history of DVT   Pulmonary: No home oxygen, no productive cough, no hemoptysis,  No asthma or wheezing  Musculoskeletal:  [ ]  Arthritis, [ ]  Low back pain,  [ ]  Joint pain  Hematologic:No history of hypercoagulable state.  No history of easy bleeding.  No history of anemia  Gastrointestinal: No hematochezia or melena,  No gastroesophageal reflux, no trouble swallowing  Urinary: [ ]  chronic Kidney disease, [ ]  on HD - [ ]  MWF or [ ]  TTHS, [ ]  Burning with urination, [ ]  Frequent urination, [ ]  Difficulty urinating;   Skin: No rashes  Psychological: No history of anxiety,  No history of depression  Physical Examination  Vitals:   08/09/23 1345  BP: 128/82  Pulse: 72  Resp: 18  Temp: 98.3 F (36.8 C)  TempSrc: Temporal  SpO2: 98%  Weight: 201 lb 9.6 oz (91.4 kg)  Height: 5\' 4"  (1.626 m)    Body mass index is 34.6 kg/m.  General:  Alert and oriented, no acute distress HEENT: Normal Neck: No bruit or JVD Pulmonary: Clear to auscultation  bilaterally Cardiac: Regular Rate and Rhythm without murmur Abdomen: Soft, non-tender, non-distended, no mass, no scars Skin: No rash Extremity Pulses:  2+ radial,  femoral, dorsalis pedis, posterior tibial pulses bilaterally Musculoskeletal: No deformity or edema  Neurologic: Upper and lower extremity motor 5/5 and symmetric  DATA: +--------------+---------+------+-----------+------------+-----------------  ----+  LEFT         Reflux NoRefluxReflux TimeDiameter cmsComments  Yes                                                 +--------------+---------+------+-----------+------------+-----------------  ----+  CFV                    yes   >1 second                                     +--------------+---------+------+-----------+------------+-----------------  ----+  FV mid                  yes   >1 second                                     +--------------+---------+------+-----------+------------+-----------------  ----+  Popliteal              yes   >1 second                                     +--------------+---------+------+-----------+------------+-----------------  ----+  GSV at [facility]              yes    >500 ms      0.81                            +--------------+---------+------+-----------+------------+-----------------  ----+  GSV prox thigh          yes    >500 ms      0.58                            +--------------+---------+------+-----------+------------+-----------------  ----+  GSV mid thigh           yes    >500 ms      0.67                            +--------------+---------+------+-----------+------------+-----------------  ----+  GSV dist thigh          yes    >500 ms  0.54 / 0.21 mid to distal,  out of                                                      fascia                   +--------------+---------+------+-----------+------------+-----------------  ----+  GSV at knee   no                            0.46    back in fascia          +--------------+---------+------+-----------+------------+-----------------  ----+  GSV prox calf no                            0.21  out fascia              +--------------+---------+------+-----------+------------+-----------------  ----+  GSV mid calf                                        NV                      +--------------+---------+------+-----------+------------+-----------------  ----+  SSV Pop Fossa no                            0.30                            +--------------+---------+------+-----------+------------+-----------------  ----+  SSV prox calf no                            0.21                            +--------------+---------+------+-----------+------------+-----------------  ----+  SSV mid calf  no                            0.28                            +--------------+---------+------+-----------+------------+-----------------  ----+  AASV O                  yes    >500 ms      0.28                            +--------------+---------+------+-----------+------------+-----------------  ----+  AASV P        no                            0.26                            +--------------+---------+------+-----------+------------+-----------------  ----+  AASV M        no                            0.28                            +--------------+---------+------+-----------+------------+-----------------  ----+       Summary:  Left:  - No evidence of deep vein thrombosis seen in the left lower extremity,  from the common femoral through the popliteal veins.  - No evidence of superficial venous reflux seen in the left short  saphenous vein.  - Venous reflux is noted in the left common femoral vein.  - Venous reflux is  noted in the left sapheno-femoral junction.  - Venous reflux is noted in the left greater saphenous vein in the mid to  distal thigh.  - Venous reflux is noted in the left femoral vein.  - Venous reflux is noted in the left popliteal vein.  - Venous reflux is noted in the AASV at origin  Assessment/Plan: Asymptomatic venous reflux She has venous reflux in the deep system as well as the SFJ/GSV With left posterior popliteal varicose vein.  She denies edema or skin changes.  She will be fitted for thigh high compression to be worn daily, elevation if she develops edema and exercise for weight loss with a healthy diet.  I suggested she control her symptoms with the above and if she develops new symptoms she will call.   She has palpable pedal pulses and is not at risk of limb loss.    Mosetta Pigeon PA-C [facility] Office: [phone]  MD in clinic [place]

## 2023-08-24 ENCOUNTER — Other Ambulatory Visit: Payer: Self-pay

## 2023-08-31 ENCOUNTER — Telehealth: Payer: 59 | Admitting: Family Medicine

## 2023-08-31 DIAGNOSIS — J40 Bronchitis, not specified as acute or chronic: Secondary | ICD-10-CM | POA: Diagnosis not present

## 2023-08-31 MED ORDER — AZITHROMYCIN 250 MG PO TABS: ORAL_TABLET | ORAL | 0 refills | Status: AC

## 2023-08-31 MED ORDER — BENZONATATE 200 MG PO CAPS: 200.0000 mg | ORAL_CAPSULE | Freq: Two times a day (BID) | ORAL | 0 refills | Status: DC | PRN

## 2023-08-31 NOTE — Progress Notes (Signed)

## 2023-09-05 DIAGNOSIS — Z6835 Body mass index (BMI) 35.0-35.9, adult: Secondary | ICD-10-CM | POA: Diagnosis not present

## 2023-09-05 DIAGNOSIS — Z130 Encounter for screening for diseases of the blood and blood-forming organs and certain disorders involving the immune mechanism: Secondary | ICD-10-CM | POA: Diagnosis not present

## 2023-09-05 DIAGNOSIS — N951 Menopausal and female climacteric states: Secondary | ICD-10-CM | POA: Diagnosis not present

## 2023-09-05 DIAGNOSIS — Z131 Encounter for screening for diabetes mellitus: Secondary | ICD-10-CM | POA: Diagnosis not present

## 2023-09-05 DIAGNOSIS — Z1329 Encounter for screening for other suspected endocrine disorder: Secondary | ICD-10-CM | POA: Diagnosis not present

## 2023-09-05 DIAGNOSIS — Z136 Encounter for screening for cardiovascular disorders: Secondary | ICD-10-CM | POA: Diagnosis not present

## 2023-09-05 DIAGNOSIS — E559 Vitamin D deficiency, unspecified: Secondary | ICD-10-CM | POA: Diagnosis not present

## 2023-09-05 DIAGNOSIS — R635 Abnormal weight gain: Secondary | ICD-10-CM | POA: Diagnosis not present

## 2023-09-17 ENCOUNTER — Other Ambulatory Visit (HOSPITAL_COMMUNITY): Payer: Self-pay

## 2023-09-17 DIAGNOSIS — F339 Major depressive disorder, recurrent, unspecified: Secondary | ICD-10-CM | POA: Diagnosis not present

## 2023-09-17 DIAGNOSIS — E7800 Pure hypercholesterolemia, unspecified: Secondary | ICD-10-CM | POA: Diagnosis not present

## 2023-09-17 DIAGNOSIS — Z6835 Body mass index (BMI) 35.0-35.9, adult: Secondary | ICD-10-CM | POA: Diagnosis not present

## 2023-09-17 DIAGNOSIS — Z1339 Encounter for screening examination for other mental health and behavioral disorders: Secondary | ICD-10-CM | POA: Diagnosis not present

## 2023-09-17 DIAGNOSIS — Z1331 Encounter for screening for depression: Secondary | ICD-10-CM | POA: Diagnosis not present

## 2023-09-17 DIAGNOSIS — R635 Abnormal weight gain: Secondary | ICD-10-CM | POA: Diagnosis not present

## 2023-09-17 DIAGNOSIS — N951 Menopausal and female climacteric states: Secondary | ICD-10-CM | POA: Diagnosis not present

## 2023-09-17 DIAGNOSIS — F419 Anxiety disorder, unspecified: Secondary | ICD-10-CM | POA: Diagnosis not present

## 2023-09-17 DIAGNOSIS — E038 Other specified hypothyroidism: Secondary | ICD-10-CM | POA: Diagnosis not present

## 2023-09-17 MED ORDER — THYROID 30 MG PO TABS
30.0000 mg | ORAL_TABLET | Freq: Every day | ORAL | 1 refills | Status: AC
  Filled 2023-09-17: qty 30, 30d supply, fill #0
  Filled 2023-10-13: qty 30, 30d supply, fill #1

## 2023-09-18 ENCOUNTER — Other Ambulatory Visit (HOSPITAL_COMMUNITY): Payer: Self-pay

## 2023-09-19 ENCOUNTER — Other Ambulatory Visit (HOSPITAL_COMMUNITY): Payer: Self-pay

## 2023-09-20 ENCOUNTER — Other Ambulatory Visit (HOSPITAL_COMMUNITY): Payer: Self-pay

## 2023-09-21 ENCOUNTER — Other Ambulatory Visit (HOSPITAL_COMMUNITY): Payer: Self-pay

## 2023-09-24 ENCOUNTER — Other Ambulatory Visit (HOSPITAL_COMMUNITY): Payer: Self-pay

## 2023-09-24 ENCOUNTER — Encounter: Payer: Self-pay | Admitting: Family Medicine

## 2023-09-24 ENCOUNTER — Ambulatory Visit (INDEPENDENT_AMBULATORY_CARE_PROVIDER_SITE_OTHER): Payer: 59 | Admitting: Family Medicine

## 2023-09-24 VITALS — BP 118/80 | Ht 63.0 in | Wt 195.0 lb

## 2023-09-24 DIAGNOSIS — M722 Plantar fascial fibromatosis: Secondary | ICD-10-CM

## 2023-09-24 DIAGNOSIS — Q6672 Congenital pes cavus, left foot: Secondary | ICD-10-CM

## 2023-09-24 DIAGNOSIS — M79674 Pain in right toe(s): Secondary | ICD-10-CM

## 2023-09-24 DIAGNOSIS — Q6671 Congenital pes cavus, right foot: Secondary | ICD-10-CM | POA: Insufficient documentation

## 2023-09-24 MED ORDER — COVID-19 MRNA VAC-TRIS(PFIZER) 30 MCG/0.3ML IM SUSY
0.3000 mL | PREFILLED_SYRINGE | Freq: Once | INTRAMUSCULAR | 0 refills | Status: AC
  Filled 2023-09-24: qty 0.3, 1d supply, fill #0

## 2023-09-24 NOTE — Patient Instructions (Signed)
You have plantar fasciitis that is leading to your foot pain - you can take tylenol and/or aleve as needed for pain  - you should do the home exercises as directed 1-2 times a day - You can add heel walks, toe walks forward and backward as well - Ice heel for 15 minutes as needed. - you can continue foot soaks as needed - you should avoid flat shoes/barefoot walking as much as possible. - you can use heel cups to help with your pain - you should follow-up with me in 6 weeks - if no improvement we can consider orthotics/shoe inserts, physical therapy, injections, or possible shock-wave therapy   You also have a small bone chip/fracture of your right 2nd toe  You should buddy tape your 2nd and 3rd toe for support and to help with pain This should heal in the next 4-6 weeks

## 2023-09-24 NOTE — Assessment & Plan Note (Signed)
Bilateral heel pain, left greater than right, with bilateral plantar fasciitis, likely secondary to bilateral pes cavus  Plan: Diagnosis and treatment discussed in detail Recommend she wear good supportive shoes.  Should try to limit walking around barefoot.  Recommend she purchase house shoes with some support such as Birkenstocks Was given heel cups in office today to wear in her shoes at work Given home exercise program to do 1-2 times a day to help with her symptoms Continue icing as needed OTC NSAIDs as needed Follow-up 6 weeks for reevaluation, consider orthotics, formal physical therapy, shockwave, injections if symptoms or not improving.

## 2023-09-24 NOTE — Progress Notes (Signed)
DATE OF VISIT: 09/24/2023        [name] DOB: 1971/10/19 MRN: [id number]  CC:  b/l foot pain  History- [name] is a 52 y.o.  female for evaluation and treatment of b/l foot pain  Rt 2nd toe pain - ran into the back of daughter on Saturday - stubbed the toe - had some immediate pain - later that day noted some increased pain (+)bruising Still tender and sore    B/l foot pain (Lt>Rt) - pain intermittent x 1 year - not worse with hiking trip in early Oct - worse over the last 2 weeks - worse along the arch and into the medial heel - some pain along the lateral foot and dorsal foot - left foot sometimes worse with walking - denies numbness/tingling - wears supportive shoes in the hospital - using lacrosse ball - doing stretching - no pain meds - doing foot soaks with Epsom salt - worse in the AM when waking - worse when sitting for long periods and standing - improved  Big hiking trip - [place], [place], [place] of [employer] (9-days hiking - was earlier this month for Fall Break)   Past Medical History Past Medical History:  Diagnosis Date   Anemia    Back pain    Depression    followed by Dr. Cherly Hensen   Gallbladder problem    GERD (gastroesophageal reflux disease)    History of kidney problems    1975, eval by Dr. 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 & LT renal compensatory hypertrophy w/ no evidence of obstruction & he felt no further invasive studies were needed    Kidney problem    Migraines    [facility]   Palpitations    Vitamin D deficiency     Past Surgical History Past Surgical History:  Procedure Laterality Date   CESAREAN SECTION  2007 & 2009   CHOLECYSTECTOMY  2009   CYSTOSCOPY  age 20   SHOULDER ARTHROSCOPY WITH SUBACROMIAL DECOMPRESSION, ROTATOR CUFF REPAIR AND BICEP TENDON REPAIR Right 02/11/2016   Procedure: RIGHT SHOULDER ARTHROSCOPY WITH SUBACROMIAL  DECOMPRESSION, OPEN DCR,  OPEN MINI ROTATOR CUFF REPAIR, LABRAL REPAIR;  Surgeon: Beverely Low, MD;  Location: [facility] OR;  Service: Orthopedics;  Laterality: Right;   TUBAL LIGATION     uterine ablation      Medications Current Outpatient Medications  Medication Sig Dispense Refill   benzonatate (TESSALON) 200 MG capsule Take 1 capsule (200 mg total) by mouth 2 (two) times daily as needed for cough. 20 capsule 0   buPROPion (WELLBUTRIN SR) 150 MG 12 hr tablet Take 1 tablet (150 mg total) by mouth 2 (two) times daily. 180 tablet 1   Cholecalciferol (VITAMIN D) 125 MCG (5000 UT) CAPS One cap every other day 30 capsule 0   cycloSPORINE (RESTASIS) 0.05 % ophthalmic emulsion Instill 1 drop into both eyes twice a day as directed 180 each 4   fluconazole (DIFLUCAN) 150 MG tablet Take 1 tablet (150 mg total) by mouth once. May dose repeat in 3 days 2 tablet 0   methocarbamol (ROBAXIN) 500 MG tablet Take 1 tablet (500 mg total) by mouth every 8 (eight) hours as needed for muscle spasms. 30 tablet 0   thyroid (NP THYROID) 30 MG tablet Take 1 tablet (30 mg total) by mouth daily on an empty stomach. 30 tablet 1   valACYclovir (VALTREX) 1000 MG tablet Take  2 tablets in the morning and 2 tablets in the evening for 1 day at first signs of flare. 20 tablet 2   No current facility-administered medications for this visit.    Allergies has No Known Allergies.  Family History - reviewed per EMR and intake form  Social History   reports current alcohol use.  reports that she has quit smoking. She has never used smokeless tobacco.  reports no history of drug use. OCCUPATION: inpatient Cardiac Rehab at [employer]    EXAM: Vitals: BP 118/80   Ht 5\' 3"  (1.6 m)   Wt 195 lb (88.5 kg)   BMI 34.54 kg/m  General: AOx3, NAD, pleasant SKIN: no rashes or lesions, skin clean, dry, intact MSK: Foot/ankle: Bilateral ankle with tight Achilles, otherwise good range of motion.  She has bilateral pes cavus.  She is  tender to palpation along the insertion of the plantar fascia along medial calcaneus bilaterally.  She does have slight collapse of her transverse arches bilaterally with splaying between the second and third toes bilaterally.  She has ecchymosis of the distal phalanx of the right second toe, mild ecchymosis at the right second MTP as well.  Has tenderness to palpation along the distal phalanx of the right second toe and the DIP joint.  No other bruising.  No increased redness or warmth.  No significant tenderness throughout the midfoot bilaterally. Walking without a limp  NEURO: sensation intact to light touch lower extremity bilaterally VASC: pulses 2+ and symmetric DP/PT bilaterally, normal capillary refill toes bilaterally, no edema   Assessment & Plan Plantar fasciitis, bilateral Bilateral heel pain, left greater than right, with bilateral plantar fasciitis, likely secondary to bilateral pes cavus  Plan: Diagnosis and treatment discussed in detail Recommend she wear good supportive shoes.  Should try to limit walking around barefoot.  Recommend she purchase house shoes with some support such as Birkenstocks Was given heel cups in office today to wear in her shoes at work Given home exercise program to do 1-2 times a day to help with her symptoms Continue icing as needed OTC NSAIDs as needed Follow-up 6 weeks for reevaluation, consider orthotics, formal physical therapy, shockwave, injections if symptoms or not improving. Pes cavus of both feet Bilateral pes cavus likely contributing to her plantar fasciitis  Plan: Recommend she wear good supportive shoes.  Should try to limit walking around barefoot Could consider orthotics in the future if continues to have ongoing issues Toe pain, right Acute toe pain of right second toe following injury 3 days ago, brief bedside MSK ultrasound shows small avulsion of the distal phalanx, no other fracture.  Images were not saved  Plan: Recommend  buddy taping the second and third toe for added support and stability Ice as needed OTC NSAIDs as needed Should wear good supportive shoes Follow-up 4 to 6 weeks for reevaluation, sooner as needed  Patient expressed understanding & agreement with above.  Encounter Diagnoses  Name Primary?   Plantar fasciitis, bilateral Yes   Pes cavus of both feet    Toe pain, right     No orders of the defined types were placed in this encounter.   No orders of the defined types were placed in this encounter.

## 2023-09-24 NOTE — Assessment & Plan Note (Signed)
Bilateral pes cavus likely contributing to her plantar fasciitis  Plan: Recommend she wear good supportive shoes.  Should try to limit walking around barefoot Could consider orthotics in the future if continues to have ongoing issues

## 2023-10-01 DIAGNOSIS — A6000 Herpesviral infection of urogenital system, unspecified: Secondary | ICD-10-CM | POA: Diagnosis not present

## 2023-10-01 DIAGNOSIS — Z01419 Encounter for gynecological examination (general) (routine) without abnormal findings: Secondary | ICD-10-CM | POA: Diagnosis not present

## 2023-10-01 DIAGNOSIS — Z9889 Other specified postprocedural states: Secondary | ICD-10-CM | POA: Diagnosis not present

## 2023-10-01 DIAGNOSIS — F32A Depression, unspecified: Secondary | ICD-10-CM | POA: Diagnosis not present

## 2023-10-13 ENCOUNTER — Other Ambulatory Visit (HOSPITAL_COMMUNITY): Payer: Self-pay

## 2023-10-15 ENCOUNTER — Other Ambulatory Visit: Payer: Self-pay

## 2023-10-30 DIAGNOSIS — N951 Menopausal and female climacteric states: Secondary | ICD-10-CM | POA: Diagnosis not present

## 2023-10-30 DIAGNOSIS — E038 Other specified hypothyroidism: Secondary | ICD-10-CM | POA: Diagnosis not present

## 2023-11-05 ENCOUNTER — Ambulatory Visit (INDEPENDENT_AMBULATORY_CARE_PROVIDER_SITE_OTHER): Payer: 59 | Admitting: Family Medicine

## 2023-11-05 ENCOUNTER — Encounter: Payer: Self-pay | Admitting: Family Medicine

## 2023-11-05 VITALS — BP 126/74 | Ht 63.0 in | Wt 195.0 lb

## 2023-11-05 DIAGNOSIS — Q6672 Congenital pes cavus, left foot: Secondary | ICD-10-CM

## 2023-11-05 DIAGNOSIS — M79674 Pain in right toe(s): Secondary | ICD-10-CM | POA: Diagnosis not present

## 2023-11-05 DIAGNOSIS — Q6671 Congenital pes cavus, right foot: Secondary | ICD-10-CM | POA: Diagnosis not present

## 2023-11-05 DIAGNOSIS — M722 Plantar fascial fibromatosis: Secondary | ICD-10-CM | POA: Diagnosis not present

## 2023-11-05 NOTE — Assessment & Plan Note (Signed)
Bilateral plantar fasciitis left greater than right, ongoing symptoms despite heel cups and improved foot wear  Plan: -Discussed additional treatment options including temporary orthotics versus custom orthotics.  She would like to proceed with custom orthotics.  Reviewed pricing, she will plan to schedule follow-up for this at her convenience -She will continue with wearing supportive footwear and home exercises in the interim -Continue heat or ice as needed -Follow-up for custom orthotics visit in the future

## 2023-11-05 NOTE — Progress Notes (Signed)
DATE OF VISIT: 11/05/2023        [name] DOB: 1971/06/21 MRN: [id number]  CC:  f/u b/l foot pain and Rt toe pain  History of present Illness: [name] is a 52 y.o. female who presents for a follow-up visit   Rt 2nd toe is feeling better since last visit Still a little red and swollen Occ pain Overall feeling much better, not bothered by it currently  Still has ongoing b/l foot pain (Lt>Rt) Last visit recommended supportive shoes and limiting barefoot walking Also given heel cups - unsure if they were helpful Got new shoes Got Oophos at home - has been helpful Rt feeling occ pain in the heel Occ Ibuprofen prn Occ Deep Relief essential oils prn Still doing foot soaks Doing HEP at least once a day, sometimes twice a day  Likes walking - 2-3 miles several days/wk  Medications:  Outpatient Encounter Medications as of 11/05/2023  Medication Sig   benzonatate (TESSALON) 200 MG capsule Take 1 capsule (200 mg total) by mouth 2 (two) times daily as needed for cough.   buPROPion (WELLBUTRIN SR) 150 MG 12 hr tablet Take 1 tablet (150 mg total) by mouth 2 (two) times daily.   Cholecalciferol (VITAMIN D) 125 MCG (5000 UT) CAPS One cap every other day   methocarbamol (ROBAXIN) 500 MG tablet Take 1 tablet (500 mg total) by mouth every 8 (eight) hours as needed for muscle spasms.   thyroid (NP THYROID) 30 MG tablet Take 1 tablet (30 mg total) by mouth daily on an empty stomach.   valACYclovir (VALTREX) 1000 MG tablet Take 2 tablets in the morning and 2 tablets in the evening for 1 day at first signs of flare.   No facility-administered encounter medications on file as of 11/05/2023.    Allergies: has No Known Allergies.  Physical Examination: Vitals: BP 126/74   Ht 5\' 3"  (1.6 m)   Wt 195 lb (88.5 kg)   BMI 34.54 kg/m  GENERAL:  [name] is a 52 y.o. female appearing their stated age, alert and oriented x 3, in no apparent distress.  MSK: Bilateral foot/ankle with  tight Achilles.  She has bilateral pes cavus.  Has slight collapse of transverse arches bilaterally with splaying between her second and third toes bilaterally.  Right second toe with slight soft tissue swelling, no increased redness, minimal tenderness to palpation, good range of motion.  She is tender to palpation along the insertion of the plantar fascia bilaterally, left greater than right. Walking without a limp Neurovascular intact distally  Assessment & Plan Plantar fasciitis, bilateral Bilateral plantar fasciitis left greater than right, ongoing symptoms despite heel cups and improved foot wear  Plan: -Discussed additional treatment options including temporary orthotics versus custom orthotics.  She would like to proceed with custom orthotics.  Reviewed pricing, she will plan to schedule follow-up for this at her convenience -She will continue with wearing supportive footwear and home exercises in the interim -Continue heat or ice as needed -Follow-up for custom orthotics visit in the future Pes cavus of both feet Contributing to plantar fasciitis  Plan: -Follow-up for custom orthotics as noted above Toe pain, right Right second toe pain greatly improved with ultrasound last visit showing small avulsion fracture of the distal phalanx  Plan: -Continue wear good supportive shoes -Advance activity as tolerated -Follow-up as needed for the toe   Patient expressed understanding & agreement with above.  Encounter Diagnoses  Name Primary?   Plantar  fasciitis, bilateral Yes   Pes cavus of both feet    Toe pain, right     No orders of the defined types were placed in this encounter.

## 2023-11-05 NOTE — Assessment & Plan Note (Signed)
Contributing to plantar fasciitis  Plan: -Follow-up for custom orthotics as noted above

## 2023-11-05 NOTE — Patient Instructions (Signed)
You should continue your current treatment You can return at your convenience for custom orthotics

## 2023-11-06 ENCOUNTER — Other Ambulatory Visit (HOSPITAL_COMMUNITY): Payer: Self-pay

## 2023-11-06 DIAGNOSIS — E039 Hypothyroidism, unspecified: Secondary | ICD-10-CM | POA: Diagnosis not present

## 2023-11-06 DIAGNOSIS — F339 Major depressive disorder, recurrent, unspecified: Secondary | ICD-10-CM | POA: Diagnosis not present

## 2023-11-06 DIAGNOSIS — Z6835 Body mass index (BMI) 35.0-35.9, adult: Secondary | ICD-10-CM | POA: Diagnosis not present

## 2023-11-06 DIAGNOSIS — N951 Menopausal and female climacteric states: Secondary | ICD-10-CM | POA: Diagnosis not present

## 2023-11-06 DIAGNOSIS — F419 Anxiety disorder, unspecified: Secondary | ICD-10-CM | POA: Diagnosis not present

## 2023-11-06 DIAGNOSIS — M2550 Pain in unspecified joint: Secondary | ICD-10-CM | POA: Diagnosis not present

## 2023-11-07 ENCOUNTER — Other Ambulatory Visit (HOSPITAL_COMMUNITY): Payer: Self-pay

## 2023-11-07 ENCOUNTER — Other Ambulatory Visit: Payer: Self-pay

## 2023-11-07 MED ORDER — BUPROPION HCL ER (SR) 150 MG PO TB12
150.0000 mg | ORAL_TABLET | Freq: Two times a day (BID) | ORAL | 0 refills | Status: AC
  Filled 2023-11-07: qty 180, 90d supply, fill #0

## 2023-11-07 MED ORDER — THYROID 60 MG PO TABS
60.0000 mg | ORAL_TABLET | Freq: Every day | ORAL | 1 refills | Status: DC
  Filled 2023-11-07: qty 30, 30d supply, fill #0
  Filled 2024-02-13: qty 30, 30d supply, fill #1

## 2023-11-08 ENCOUNTER — Ambulatory Visit (INDEPENDENT_AMBULATORY_CARE_PROVIDER_SITE_OTHER): Payer: 59 | Admitting: Family Medicine

## 2023-11-08 ENCOUNTER — Encounter: Payer: Self-pay | Admitting: Family Medicine

## 2023-11-08 VITALS — BP 124/84 | Ht 63.0 in | Wt 195.0 lb

## 2023-11-08 DIAGNOSIS — Q6671 Congenital pes cavus, right foot: Secondary | ICD-10-CM | POA: Diagnosis not present

## 2023-11-08 DIAGNOSIS — M722 Plantar fascial fibromatosis: Secondary | ICD-10-CM

## 2023-11-08 DIAGNOSIS — Q6672 Congenital pes cavus, left foot: Secondary | ICD-10-CM | POA: Diagnosis not present

## 2023-11-08 NOTE — Assessment & Plan Note (Signed)
Bilateral plantar fasciitis with associated pes cavus, would benefit from custom orthotics  Plan: -Custom orthotics fabricated today

## 2023-11-08 NOTE — Patient Instructions (Signed)
Thank you for coming to see me today. It was a pleasure.   We made you new orthotics. Let us know if we need to add any extra cushioning or make changes.  If you have any questions or concerns, please do not hesitate to call the office at [phone].

## 2023-11-08 NOTE — Progress Notes (Signed)
DATE OF VISIT: 11/08/2023        [name] DOB: 1971/06/27 MRN: [id number]  CC:  visit for custom orthotics  History of present Illness: [name] is a 52 y.o. female who presents for a follow-up visit for custom orthotics Has been suffering with plantar fasciitis Last visit discussed trial of temporary orthotics vs custom and she would like to proceed with custom orthotics.  Medications:  Outpatient Encounter Medications as of 11/08/2023  Medication Sig   benzonatate (TESSALON) 200 MG capsule Take 1 capsule (200 mg total) by mouth 2 (two) times daily as needed for cough.   buPROPion (WELLBUTRIN SR) 150 MG 12 hr tablet Take 1 tablet (150 mg total) by mouth 2 (two) times daily.   Cholecalciferol (VITAMIN D) 125 MCG (5000 UT) CAPS One cap every other day   methocarbamol (ROBAXIN) 500 MG tablet Take 1 tablet (500 mg total) by mouth every 8 (eight) hours as needed for muscle spasms.   thyroid (NP THYROID) 30 MG tablet Take 1 tablet (30 mg total) by mouth daily on an empty stomach.   thyroid (NP THYROID) 60 MG tablet Take 1 tablet (60 mg total) by mouth daily on an empty stomach.   valACYclovir (VALTREX) 1000 MG tablet Take 2 tablets in the morning and 2 tablets in the evening for 1 day at first signs of flare.   No facility-administered encounter medications on file as of 11/08/2023.    Allergies: has no known allergies.  Physical Examination: Vitals: BP 124/84   Ht 5\' 3"  (1.6 m)   Wt 195 lb (88.5 kg)   BMI 34.54 kg/m  GENERAL:  [name] is a 52 y.o. female appearing their stated age, alert and oriented x 3, in no apparent distress.  SKIN: no rashes or lesions, skin clean, dry, intact MSK: Bilateral foot with pes cavus.  Slight transverse arch collapse bilaterally with splaying between second and third toes. Normal gait Neurovascular intact distally  Assessment & Plan Plantar fasciitis, bilateral Bilateral plantar fasciitis with associated pes cavus, would  benefit from custom orthotics  Plan: -Custom orthotics fabricated today -Will continue with home exercises -Will continue with supportive footwear -Could consider trial of cortisone injection or shockwave therapy if no improvement Follow-up 4 to 6 weeks if no improvement  ORTHOTICS PREPARATION Patient was fitted for a : Fit 'n Run semi-rigid orthotic  The orthotic was heated, placed on the orthotic stand. The patient was positioned in subtalar neutral position and 10 degrees of ankle dorsiflexion in a weight bearing stance on the heated orthotic blank After completion of molding Blank: Fit 'n Run - size 9 Posting:  none Base: none  Orthotics were comfortable in the office and patient had neutral positioning and gait  Pes cavus of both feet Bilateral plantar fasciitis with associated pes cavus, would benefit from custom orthotics  Plan: -Custom orthotics fabricated today   Patient expressed understanding & agreement with above.  Encounter Diagnoses  Name Primary?   Plantar fasciitis, bilateral Yes   Pes cavus of both feet     No orders of the defined types were placed in this encounter. \

## 2023-11-08 NOTE — Assessment & Plan Note (Signed)
Bilateral plantar fasciitis with associated pes cavus, would benefit from custom orthotics  Plan: -Custom orthotics fabricated today -Will continue with home exercises -Will continue with supportive footwear -Could consider trial of cortisone injection or shockwave therapy if no improvement Follow-up 4 to 6 weeks if no improvement  ORTHOTICS PREPARATION Patient was fitted for a : Fit 'n Run semi-rigid orthotic  The orthotic was heated, placed on the orthotic stand. The patient was positioned in subtalar neutral position and 10 degrees of ankle dorsiflexion in a weight bearing stance on the heated orthotic blank After completion of molding Blank: Fit 'n Run - size 9 Posting:  none Base: none  Orthotics were comfortable in the office and patient had neutral positioning and gait

## 2023-11-27 DIAGNOSIS — H5203 Hypermetropia, bilateral: Secondary | ICD-10-CM | POA: Diagnosis not present

## 2023-12-27 ENCOUNTER — Other Ambulatory Visit (HOSPITAL_COMMUNITY): Payer: Self-pay

## 2024-01-02 DIAGNOSIS — G5603 Carpal tunnel syndrome, bilateral upper limbs: Secondary | ICD-10-CM | POA: Diagnosis not present

## 2024-01-02 DIAGNOSIS — M25531 Pain in right wrist: Secondary | ICD-10-CM | POA: Diagnosis not present

## 2024-01-02 DIAGNOSIS — M25532 Pain in left wrist: Secondary | ICD-10-CM | POA: Diagnosis not present

## 2024-01-09 DIAGNOSIS — Z1231 Encounter for screening mammogram for malignant neoplasm of breast: Secondary | ICD-10-CM | POA: Diagnosis not present

## 2024-01-13 DIAGNOSIS — G5603 Carpal tunnel syndrome, bilateral upper limbs: Secondary | ICD-10-CM | POA: Diagnosis not present

## 2024-01-28 DIAGNOSIS — D485 Neoplasm of uncertain behavior of skin: Secondary | ICD-10-CM | POA: Diagnosis not present

## 2024-01-28 DIAGNOSIS — L821 Other seborrheic keratosis: Secondary | ICD-10-CM | POA: Diagnosis not present

## 2024-01-28 DIAGNOSIS — L710 Perioral dermatitis: Secondary | ICD-10-CM | POA: Diagnosis not present

## 2024-01-28 DIAGNOSIS — D225 Melanocytic nevi of trunk: Secondary | ICD-10-CM | POA: Diagnosis not present

## 2024-01-28 DIAGNOSIS — M6740 Ganglion, unspecified site: Secondary | ICD-10-CM | POA: Diagnosis not present

## 2024-01-28 DIAGNOSIS — D1801 Hemangioma of skin and subcutaneous tissue: Secondary | ICD-10-CM | POA: Diagnosis not present

## 2024-02-07 DIAGNOSIS — E039 Hypothyroidism, unspecified: Secondary | ICD-10-CM | POA: Diagnosis not present

## 2024-02-13 ENCOUNTER — Other Ambulatory Visit (HOSPITAL_COMMUNITY): Payer: Self-pay

## 2024-02-18 ENCOUNTER — Other Ambulatory Visit (HOSPITAL_COMMUNITY): Payer: Self-pay

## 2024-02-18 DIAGNOSIS — Z6837 Body mass index (BMI) 37.0-37.9, adult: Secondary | ICD-10-CM | POA: Diagnosis not present

## 2024-02-18 DIAGNOSIS — F419 Anxiety disorder, unspecified: Secondary | ICD-10-CM | POA: Diagnosis not present

## 2024-02-18 DIAGNOSIS — M2550 Pain in unspecified joint: Secondary | ICD-10-CM | POA: Diagnosis not present

## 2024-02-18 DIAGNOSIS — E039 Hypothyroidism, unspecified: Secondary | ICD-10-CM | POA: Diagnosis not present

## 2024-02-18 DIAGNOSIS — N951 Menopausal and female climacteric states: Secondary | ICD-10-CM | POA: Diagnosis not present

## 2024-02-18 DIAGNOSIS — Z79890 Hormone replacement therapy (postmenopausal): Secondary | ICD-10-CM | POA: Diagnosis not present

## 2024-02-18 MED ORDER — THYROID 60 MG PO TABS
60.0000 mg | ORAL_TABLET | Freq: Every day | ORAL | 2 refills | Status: AC
  Filled 2024-03-09: qty 30, 30d supply, fill #0

## 2024-02-21 ENCOUNTER — Other Ambulatory Visit (HOSPITAL_COMMUNITY): Payer: Self-pay

## 2024-02-27 ENCOUNTER — Other Ambulatory Visit: Payer: Self-pay

## 2024-02-27 ENCOUNTER — Other Ambulatory Visit (HOSPITAL_BASED_OUTPATIENT_CLINIC_OR_DEPARTMENT_OTHER): Payer: Self-pay

## 2024-02-27 ENCOUNTER — Other Ambulatory Visit (HOSPITAL_COMMUNITY): Payer: Self-pay

## 2024-02-27 DIAGNOSIS — G5603 Carpal tunnel syndrome, bilateral upper limbs: Secondary | ICD-10-CM | POA: Diagnosis not present

## 2024-02-27 DIAGNOSIS — Z4789 Encounter for other orthopedic aftercare: Secondary | ICD-10-CM | POA: Diagnosis not present

## 2024-02-27 MED ORDER — CEPHALEXIN 500 MG PO CAPS
500.0000 mg | ORAL_CAPSULE | Freq: Four times a day (QID) | ORAL | 0 refills | Status: DC
  Filled 2024-02-27 (×2): qty 28, 7d supply, fill #0

## 2024-03-01 ENCOUNTER — Other Ambulatory Visit (HOSPITAL_COMMUNITY): Payer: Self-pay

## 2024-03-09 ENCOUNTER — Other Ambulatory Visit (HOSPITAL_COMMUNITY): Payer: Self-pay

## 2024-03-10 ENCOUNTER — Other Ambulatory Visit (HOSPITAL_COMMUNITY): Payer: Self-pay

## 2024-03-10 ENCOUNTER — Other Ambulatory Visit: Payer: Self-pay

## 2024-03-11 ENCOUNTER — Other Ambulatory Visit (HOSPITAL_COMMUNITY): Payer: Self-pay

## 2024-03-12 DIAGNOSIS — M25531 Pain in right wrist: Secondary | ICD-10-CM | POA: Diagnosis not present

## 2024-03-21 ENCOUNTER — Ambulatory Visit (INDEPENDENT_AMBULATORY_CARE_PROVIDER_SITE_OTHER): Admitting: Behavioral Health

## 2024-03-21 DIAGNOSIS — F4323 Adjustment disorder with mixed anxiety and depressed mood: Secondary | ICD-10-CM
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
## 2024-03-21 NOTE — Progress Notes (Signed)
   Sheila Lever, LMFT

## 2024-03-24 DIAGNOSIS — Z6838 Body mass index (BMI) 38.0-38.9, adult: Secondary | ICD-10-CM | POA: Diagnosis not present

## 2024-03-24 DIAGNOSIS — E88819 Insulin resistance, unspecified: Secondary | ICD-10-CM | POA: Diagnosis not present

## 2024-03-24 DIAGNOSIS — G43009 Migraine without aura, not intractable, without status migrainosus: Secondary | ICD-10-CM | POA: Diagnosis not present

## 2024-03-24 DIAGNOSIS — E559 Vitamin D deficiency, unspecified: Secondary | ICD-10-CM | POA: Diagnosis not present

## 2024-03-24 DIAGNOSIS — Z0000 Encounter for general adult medical examination without abnormal findings: Secondary | ICD-10-CM | POA: Diagnosis not present

## 2024-03-28 DIAGNOSIS — E559 Vitamin D deficiency, unspecified: Secondary | ICD-10-CM | POA: Diagnosis not present

## 2024-03-28 DIAGNOSIS — Z0000 Encounter for general adult medical examination without abnormal findings: Secondary | ICD-10-CM | POA: Diagnosis not present

## 2024-03-28 DIAGNOSIS — Z6838 Body mass index (BMI) 38.0-38.9, adult: Secondary | ICD-10-CM | POA: Diagnosis not present

## 2024-03-28 DIAGNOSIS — E88819 Insulin resistance, unspecified: Secondary | ICD-10-CM | POA: Diagnosis not present

## 2024-04-07 ENCOUNTER — Other Ambulatory Visit (HOSPITAL_COMMUNITY): Payer: Self-pay

## 2024-04-16 ENCOUNTER — Ambulatory Visit (INDEPENDENT_AMBULATORY_CARE_PROVIDER_SITE_OTHER): Admitting: Behavioral Health

## 2024-04-16 DIAGNOSIS — F4323 Adjustment disorder with mixed anxiety and depressed mood: Secondary | ICD-10-CM | POA: Diagnosis not present

## 2024-04-16 NOTE — Progress Notes (Signed)
 [facility] Counselor/Therapist Progress Note  Patient ID: [name], MRN: [id number],    Date: 04/16/2024  Time Spent: 50/ min Caregility video; Pt is home in private & Provider working from [employer] remotely. Pt is aware of the risks/limitations & consents to Tx today. Time In: 4:00pm Time Out: 4:50pm  Reported Symptoms: Reduction in anx/dep & marital distress  Mental Status Exam: Appearance:  Casual     Behavior: Appropriate and Sharing  Motor: Normal  Speech/Language:  Clear and Coherent  Affect: Appropriate  Mood: normal  Thought process: normal  Thought content:   WNL  Sensory/Perceptual disturbances:   WNL  Orientation: oriented to person, place, time/date, and situation  Attention: Good  Concentration: Good  Memory: WNL  Fund of knowledge:  Good  Insight:   Good  Judgment:  Good  Impulse Control: Good   Risk Assessment: Danger to Self:  No Self-injurious Behavior: No Danger to Others: No Duty to Warn:no Physical Aggression / Violence:No  Access to Firearms a concern: No  Gang Involvement:No   Subjective: Pt is doing well today. She is considering a new job opportunity.  Pt cont's to progress in her thinking about Family & her current place in her life. 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   Pt is keeping her perspective on the current Political Climate & has re-adjusted her thinking to be more realistic.  Interventions: Family Systems  Diagnosis:Adjustment disorder with mixed anxiety and depressed mood  Plan: Pt will plan her discussion w/the Recruiter & ask pointed questions about the benefits of the position. She will emphasize to the Recruiter about the Total Benefits Statement.   Target Date: 05/26/2024  Progress:7  Frequency: Once monthly or TBD  Modality: Deborrah Fam, LMFT

## 2024-04-16 NOTE — Progress Notes (Signed)
   Sheila Lever, LMFT

## 2024-05-02 ENCOUNTER — Ambulatory Visit (INDEPENDENT_AMBULATORY_CARE_PROVIDER_SITE_OTHER): Admitting: Behavioral Health

## 2024-05-02 DIAGNOSIS — F4323 Adjustment disorder with mixed anxiety and depressed mood: Secondary | ICD-10-CM

## 2024-05-02 NOTE — Progress Notes (Signed)
   Deneise Lever, LMFT

## 2024-05-02 NOTE — Progress Notes (Signed)
 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 Pt is aware of the risks/limitations of telehealth & consents to Tx today. Time In: 1:00pm Time Out: 1:50pm   Treatment Type: Individual Therapy  Reported Symptoms: Reduction of anx/dep & work stress  Mental Status Exam: Appearance:  Casual     Behavior: Appropriate and Sharing  Motor: Normal  Speech/Language:  Clear and Coherent  Affect: Appropriate  Mood: normal  Thought process: normal  Thought content:   WNL  Sensory/Perceptual disturbances:   WNL  Orientation: oriented to person, place, time/date, and situation  Attention: Good  Concentration: Good  Memory: WNL  Fund of knowledge:  Good  Insight:   Good  Judgment:  Good  Impulse Control: Good   Risk Assessment: Danger to Self:  No Self-injurious Behavior: No Danger to Others: No Duty to Warn:no Physical Aggression / Violence:No  Access to Firearms a concern: No  Gang Involvement:No   Subjective: [name] is doing well. Her Family situation has stayed improved for a few months. Her Husb is doing his part & still maintaining his support for Pt.    Pt has discontinued her Wellbutrin  SR 150mg  BID. She did this carefully & safely.   Interventions: Family Systems  Diagnosis:Adjustment disorder with mixed anxiety and depressed mood  Plan: [name] will cont to do the healthy steps she has undertaken to maintain her sanity around the [employer]. She will pursue this new position & sees how the schedule settles out & her current position she may also keep. The decisions will need to be made w/her [employer]. Overall, [name] feels very positive today & she will d/c psychotherapy @ the current time. She is aware she can contact [facility] for any future appt needs, just  as she did this time.  Target Date: Pt has stabilized & is on the right path to monitor her own goals  Progress: 8  Frequency: Need TBD by Pt  Modality: Kennis Richerd LITTIE Hollace, LMFT

## 2024-05-06 ENCOUNTER — Other Ambulatory Visit (HOSPITAL_COMMUNITY): Payer: Self-pay

## 2024-05-06 ENCOUNTER — Encounter: Payer: Self-pay | Admitting: Family Medicine

## 2024-05-06 ENCOUNTER — Ambulatory Visit (INDEPENDENT_AMBULATORY_CARE_PROVIDER_SITE_OTHER): Admitting: Family Medicine

## 2024-05-06 ENCOUNTER — Other Ambulatory Visit: Payer: Self-pay

## 2024-05-06 VITALS — BP 135/77 | Ht 63.0 in | Wt 195.0 lb

## 2024-05-06 DIAGNOSIS — M25562 Pain in left knee: Secondary | ICD-10-CM

## 2024-05-06 MED ORDER — MELOXICAM 15 MG PO TABS
15.0000 mg | ORAL_TABLET | Freq: Every day | ORAL | 1 refills | Status: AC
  Filled 2024-05-06 – 2024-05-07 (×2): qty 30, 30d supply, fill #0

## 2024-05-06 NOTE — Progress Notes (Signed)
 DATE OF VISIT: 05/06/2024        [name] DOB: 1971-07-07 MRN: [id number]  CC:  Lt knee pain  History of present Illness: [name] is a 53 y.o. female who presents for Lt knee pain Was hiking about 2 weeks ago, tripped over a root on the Trail, did a tuck and roll after she fell Primarily related to right elbow, had some swelling and bruising, but this is proved Started noticing increasing left knee pain Feeling pain in the front of the knee Worse with any twisting activities or turning Okay if she is walking straight Has noted some associated swelling Denies any clicking, catching, locking, instability Has been using ice and trying to elevate Planning to go hiking this weekend at [facility]  Medications:  Outpatient Encounter Medications as of 05/06/2024  Medication Sig   meloxicam (MOBIC) 15 MG tablet Take 1 tablet (15 mg total) by mouth daily.   benzonatate  (TESSALON ) 200 MG capsule Take 1 capsule (200 mg total) by mouth 2 (two) times daily as needed for cough.   buPROPion  (WELLBUTRIN  SR) 150 MG 12 hr tablet Take 1 tablet (150 mg total) by mouth 2 (two) times daily.   Cholecalciferol (VITAMIN D ) 125 MCG (5000 UT) CAPS One cap every other day   methocarbamol  (ROBAXIN ) 500 MG tablet Take 1 tablet (500 mg total) by mouth every 8 (eight) hours as needed for muscle spasms.   thyroid  (NP THYROID ) 30 MG tablet Take 1 tablet (30 mg total) by mouth daily on an empty stomach.   thyroid  (NP THYROID ) 60 MG tablet Take 1 tablet (60 mg total) by mouth daily on an empty stomach   valACYclovir  (VALTREX ) 1000 MG tablet Take 2 tablets in the morning and 2 tablets in the evening for 1 day at first signs of flare.   No facility-administered encounter medications on file as of 05/06/2024.    Allergies: has no known allergies.  Physical Examination: Vitals: BP 135/77   Ht 5\' 3"  (1.6 m)   Wt 195 lb (88.5 kg)   BMI 34.54 kg/m  GENERAL:  [name] is a 53 y.o. female  appearing their stated age, alert and oriented x 3, in no apparent distress.  SKIN: no rashes or lesions, skin clean, dry, intact MSK: Knee: Left knee without swelling or effusion.  Full range of motion with some pain at terminal flexion.  Tender palpation along the medial joint line mild tenderness along the lateral joint line.  No tenderness over the Pez anserine.  No tenderness over the quad or patellar tendon.  Negative Lachman, negative varus/valgus stress, mildly positive McMurray. Right knee with full range of motion without pain, weakness, instability Walking without limp Neurovascularly intact distally  Radiology: Limited MSK ultrasound left knee Date: 05/06/2024 Indication: Left knee pain Findings: - No significant effusion in the suprapatellar pouch.  Normal-appearing quad tendon - Normal-appearing patellar tendon - Normal-appearing lateral joint line and lateral meniscus - Normal-appearing medial joint line and medial meniscus - Normal-appearing collateral ligaments  Impression: - Relatively unremarkable limited left knee ultrasound today Images and interpretation completed by Marvel Slicker, DO    Assessment & Plan Acute pain of left knee Acute left knee pain x 2 weeks after tripping while hiking, concern for possible underlying meniscus tear  Plan: - MSK ultrasound completed as noted above, was relatively unremarkable.  Patient was advised that ultrasound cannot diagnose potentially deeper meniscus tears.  She is not interested in further imaging at this time -  Recommend knee sleeve with added support with activity - Continue icing as needed - Rx Mobic 15 mg 1 tab p.o. daily with food x 1 to 2 weeks, then as needed thereafter.  Should not take with any other NSAIDs - Follow-up 4 to 6 weeks if no improvement, sooner as needed.  If have ongoing issues would consider possible MRI to rule out meniscus tear   Patient expressed understanding & agreement with above.  Encounter  Diagnosis  Name Primary?   Acute pain of left knee Yes    Orders Placed This Encounter  Procedures   US  LIMITED JOINT SPACE STRUCTURES LOW LEFT

## 2024-05-07 ENCOUNTER — Other Ambulatory Visit (HOSPITAL_BASED_OUTPATIENT_CLINIC_OR_DEPARTMENT_OTHER): Payer: Self-pay

## 2024-05-07 ENCOUNTER — Other Ambulatory Visit (HOSPITAL_COMMUNITY): Payer: Self-pay

## 2024-05-07 ENCOUNTER — Telehealth: Admitting: Physician Assistant

## 2024-05-07 ENCOUNTER — Other Ambulatory Visit: Payer: Self-pay

## 2024-05-07 DIAGNOSIS — L219 Seborrheic dermatitis, unspecified: Secondary | ICD-10-CM | POA: Diagnosis not present

## 2024-05-07 DIAGNOSIS — R21 Rash and other nonspecific skin eruption: Secondary | ICD-10-CM | POA: Diagnosis not present

## 2024-05-07 MED ORDER — KETOCONAZOLE 2 % EX CREA
1.0000 | TOPICAL_CREAM | Freq: Every day | CUTANEOUS | 0 refills | Status: AC
  Filled 2024-05-07: qty 15, 15d supply, fill #0

## 2024-05-07 NOTE — Progress Notes (Signed)
 E Visit for Rash  We are sorry that you are not feeling well. Here is how we plan to help!  Based on what you have shared with me you have a mild dermatitis. This can be caused from various things including constant friction/rubbing of the area, an allergic reaction, bacteria or yeast. Based on your presentation this seems like mild seborrheic dermatitis. I want you to keep the skin clean and dry. I have sent in a cream for you to apply to the area as directed. I do recommend very carefully applying an anti dandruff shampoo to the area with your finger or qtip, letting sit for a minute and then rinsing off. You can apply a topical OTC cortisone cream to the area as well.   HOME CARE:  Take cool showers and avoid direct sunlight. Apply cool compress or wet dressings. Take a bath in an oatmeal bath.  Sprinkle content of one Aveeno packet under running faucet with comfortably warm water .  Bathe for 15-20 minutes, 1-2 times daily.  Pat dry with a towel. Do not rub the rash. Use hydrocortisone cream. Take an antihistamine like Benadryl for widespread rashes that itch.  The adult dose of Benadryl is 25-50 mg by mouth 4 times daily. Caution:  This type of medication may cause sleepiness.  Do not drink alcohol, drive, or operate dangerous machinery while taking antihistamines.  Do not take these medications if you have prostate enlargement.  Read package instructions thoroughly on all medications that you take.  GET HELP RIGHT AWAY IF:  Symptoms don't go away after treatment. Severe itching that persists. If you rash spreads or swells. If you rash begins to smell. If it blisters and opens or develops a yellow-brown crust. You develop a fever. You have a sore throat. You become short of breath.  MAKE SURE YOU:  Understand these instructions. Will watch your condition. Will get help right away if you are not doing well or get worse.  Thank you for choosing an e-visit.  Your e-visit answers  were reviewed by a board certified advanced clinical practitioner to complete your personal care plan. Depending upon the condition, your plan could have included both over the counter or prescription medications.  Please review your pharmacy choice. Make sure the pharmacy is open so you can pick up prescription now. If there is a problem, you may contact your provider through [employer] and have the prescription routed to another pharmacy.  Your safety is important to us . If you have drug allergies check your prescription carefully.   For the next 24 hours you can use MyChart to ask questions about today's visit, request a non-urgent call back, or ask for a work or school excuse. You will get an email in the next two days asking about your experience. I hope that your e-visit has been valuable and will speed your recovery.

## 2024-05-07 NOTE — Progress Notes (Signed)
 I have spent 5 minutes in review of e-visit questionnaire, review and updating patient chart, medical decision making and response to patient.   Piedad Climes, PA-C

## 2024-05-23 ENCOUNTER — Telehealth: Admitting: Family Medicine

## 2024-05-23 DIAGNOSIS — L219 Seborrheic dermatitis, unspecified: Secondary | ICD-10-CM | POA: Diagnosis not present

## 2024-05-23 NOTE — Progress Notes (Signed)
 Virtual Visit Consent   [name], you are scheduled for a virtual visit with a [facility] provider today. Just as with appointments in the office, your consent must be obtained to participate. Your consent will be active for this visit and any virtual visit you may have with one of our providers in the next 365 days. If you have a MyChart account, a copy of this consent can be sent to you electronically.  As this is a virtual visit, video technology does not allow for your provider to perform a traditional examination. This may limit your provider's ability to fully assess your condition. If your provider identifies any concerns that need to be evaluated in person or the need to arrange testing (such as labs, EKG, etc.), we will make arrangements to do so. Although advances in technology are sophisticated, we cannot ensure that it will always work on either your end or our end. If the connection with a video visit is poor, the visit may have to be switched to a telephone visit. With either a video or telephone visit, we are not always able to ensure that we have a secure connection.  By engaging in this virtual visit, you consent to the provision of healthcare and authorize for your insurance to be billed (if applicable) for the services provided during this visit. Depending on your insurance coverage, you may receive a charge related to this service.  I need to obtain your verbal consent now. Are you willing to proceed with your visit today? [name] has provided verbal consent on 05/23/2024 for a virtual visit (video or telephone). Loa Lamp, FNP  Date: 05/23/2024 2:12 PM   Virtual Visit via Video Note   I, Loa Lamp, connected with  [name]  ([id number], 05-Oct-1971) on 05/23/24 at  2:00 PM EDT by a video-enabled telemedicine application and verified that I am speaking with the correct person using two identifiers.  Location: Patient: Virtual Visit Location Patient:  Home Provider: Virtual Visit Location Provider: Home Office   I discussed the limitations of evaluation and management by telemedicine and the availability of in person appointments. The patient expressed understanding and agreed to proceed.    History of Present Illness: [name] is a 53 y.o. who identifies as a female who was assigned female at birth, and is being seen today for irritation around eyes not relieved by ketoconazole  cream given. No eye problems except watering more Allergies have been worse. SABRA  HPI: HPI  Problems:  Patient Active Problem List   Diagnosis Date Noted   Plantar fasciitis, bilateral 09/24/2023   Pes cavus of both feet 09/24/2023   Insulin  resistance 06/02/2020   Depression 05/22/2019   Vitamin D  deficiency 12/03/2018   Class 1 obesity with serious comorbidity and body mass index (BMI) of 32.0 to 32.9 in adult 12/03/2018    Allergies: No Known Allergies Medications:  Current Outpatient Medications:    buPROPion  (WELLBUTRIN  SR) 150 MG 12 hr tablet, Take 1 tablet (150 mg total) by mouth 2 (two) times daily., Disp: 180 tablet, Rfl: 0   Cholecalciferol (VITAMIN D ) 125 MCG (5000 UT) CAPS, One cap every other day, Disp: 30 capsule, Rfl: 0   ketoconazole  (NIZORAL ) 2 % cream, Apply 1 Application topically daily., Disp: 15 g, Rfl: 0   meloxicam  (MOBIC ) 15 MG tablet, Take 1 tablet (15 mg total) by mouth daily., Disp: 30 tablet, Rfl: 1   methocarbamol  (ROBAXIN ) 500 MG tablet, Take 1 tablet (500  mg total) by mouth every 8 (eight) hours as needed for muscle spasms., Disp: 30 tablet, Rfl: 0   thyroid  (NP THYROID ) 30 MG tablet, Take 1 tablet (30 mg total) by mouth daily on an empty stomach., Disp: 30 tablet, Rfl: 1   thyroid  (NP THYROID ) 60 MG tablet, Take 1 tablet (60 mg total) by mouth daily on an empty stomach, Disp: 30 tablet, Rfl: 2   valACYclovir  (VALTREX ) 1000 MG tablet, Take 2 tablets in the morning and 2 tablets in the evening for 1 day at first signs of  flare., Disp: 20 tablet, Rfl: 2  Observations/Objective: Patient is well-developed, well-nourished in no acute distress.  Resting comfortably  at home.  Head is normocephalic, atraumatic.  No labored breathing.  Speech is clear and coherent with logical content.  Patient is alert and oriented at baseline.  Dry crusty skin around eye noted.   Assessment and Plan: 1. Seborrheic dermatitis (Primary)  Continue ketoconazole  with hydrocortisone and cover with vaseline. Change allergy med and use allergy eye drops more often. UC as needed.  Follow Up Instructions: I discussed the assessment and treatment plan with the patient. The patient was provided an opportunity to ask questions and all were answered. The patient agreed with the plan and demonstrated an understanding of the instructions.  A copy of instructions were sent to the patient via MyChart unless otherwise noted below.     The patient was advised to call back or seek an in-person evaluation if the symptoms worsen or if the condition fails to improve as anticipated.    Marlie Kuennen, FNP

## 2024-05-23 NOTE — Patient Instructions (Signed)
Seborrheic Dermatitis, Adult Seborrheic dermatitis is a skin disease that causes red, scaly patches. It often occurs on the scalp, where it may be called dandruff. The patches may also appear on other parts of the body. Skin patches tend to occur where there are a lot of oil glands in the skin. Areas of the body that may be affected include: The scalp. The face, eyebrows, and ears. The area around a beard. Skin folds of the body. This includes the armpits, groin, and buttocks. The chest. The condition is often long-lasting (chronic). It may come and go for no known reason. It may be activated by a trigger, such as: Cold weather. Being out in the sun. Stress. Drinking alcohol. What are the causes? The cause of this condition is not known. It may be related to having too much yeast on the skin or changes in how your body's disease-fighting system (immune system) works. What increases the risk? You may be more likely to develop this condition if: You have a weak immune system. You are 52 years old or older. You have other conditions, such as: Human immunodeficiency virus (HIV) or acquired immunodeficiency virus (AIDS). Parkinson's disease. Mood disorders, such as depression. Liver problems. Obesity. What are the signs or symptoms? Symptoms of this condition include: Thick scales on the scalp. Redness on the face or in the armpits. Skin that is flaky. The flakes may be white or yellow. Skin that seems oily or dry but is not helped with moisturizers. Itching or burning in the affected areas. How is this diagnosed? This condition is diagnosed with a medical history and physical exam. A sample of your skin may be tested (skin biopsy). You may need to see a skin specialist (dermatologist). How is this treated? There is no cure for this condition, but treatment can help to manage the symptoms. You may get treatment to remove scales, lower the risk of skin infection, and reduce swelling or  itching. Treatment may include: Medicated shampoos, moisturizing creams, or ointments. Creams that reduce skin yeast. Creams that reduce swelling and irritation (steroids). Follow these instructions at home: Skin care Use any medicated shampoo, skin creams, or ointments only as told by your health care provider. Do not use skin products that contain alcohol. Take lukewarm baths or showers. Avoid very hot water. When you are outside, wear a hat and clothes that block UV light. General instructions Apply over-the-counter and prescription medicines only as told by your health care provider. Learn what triggers your symptoms so you can avoid these things. Use techniques for stress reduction, such as meditation or yoga. Do not drink alcohol if your health care provider tells you not to drink. Keep all follow-up visits. Your health care provider will check your skin to make sure the treatments are helping. Where to find more information American Academy of Dermatology: [employer] Contact a health care provider if: Your symptoms do not get better with treatment. Your symptoms get worse. You have new symptoms. Get help right away if: Your condition quickly gets worse, even with treatment. This information is not intended to replace advice given to you by your health care provider. Make sure you discuss any questions you have with your health care provider. Document Revised: 04/14/2022 Document Reviewed: 04/14/2022 Elsevier Patient Education  2024 [employer].

## 2024-06-26 NOTE — Progress Notes (Signed)
  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 Pt is aware of the risks/limitations of telehealth & consents to Tx today.  Time In: 1:00pm Time Out: 1: 50pm  Treatment Type: Individual Therapy  Reported Symptoms: Elevated anx/dep & stress due to political environment this year in the World & the impact on Pt.   Mental Status Exam: Appearance:  Casual     Behavior: Appropriate and Sharing  Motor: Normal  Speech/Language:  Clear and Coherent  Affect: Appropriate  Mood: anxious  Thought process: normal  Thought content:   Rumination  Sensory/Perceptual disturbances:   WNL  Orientation: oriented to person, place, time/date, and situation  Attention: Good  Concentration: Good  Memory: WNL  Fund of knowledge:  Good  Insight:   Good  Judgment:  Good  Impulse Control: Good   Risk Assessment: Danger to Self:  No Self-injurious Behavior: No Danger to Others: No Duty to Warn:no Physical Aggression / Violence:No  Access to Firearms a concern: No  Gang Involvement:No   Subjective: Pt is back today, first visit since Aug 2024. She is alerted to the [name] of the World since the Election. Pt reports she has gained 15-20# since that time. Her anx/dep & fears are heightened w/doom & gloom thoughts.  Pt concerned for fixing the problems in the News. She has catastrophic thoughts about moving Family to ensure her children's future. She is aware they need RN's in [place]. Pt has exp'd a decade of leave thinking since [place] in 2012 when a classroom of Kindergardeners were killed.   Pt decided to get her thoughts organized in the Therapy room instead of dwelling in her private life. She is staying informed, attending protests, not over-engaging in the News coverage, & getting outside into the  environment more often. Pt is engaged in excressive If then thinking that is causing mental incapacity. Pt is ready to move if need be, but Husb Oneil is not.   Pt feels women are being disenfranchised even more now. She does not feel as healthy since her let down on exercise. [id number] was esp'ly difficult w/Husb's surgery & removal of lung nodule. In Nov/Dec she cried almost daily. Pt has multiple Family members who are, hardcore Trumpers. They are difficult to deal with for the past 6 mos. Pt has a Servant's heart as well as her Parents.   Interventions: Family Systems and Interpersonal  Diagnosis:Adjustment disorder with mixed anxiety and depressed mood  Plan: [name] wanted to re-enter Therapy to assist her to vent her concerns about the World & process the reality of these thoughts. She wants to be realistic & not dramatic. She will cont to attend our sessions as long as nec for her to regain her full composure as the Election has disrupted this sense of health.   Target Date: 04/25/2024  Progress: 7  Frequency: Once monthly prn  Modality: Kennis Richerd LITTIE Hollace, LMFT

## 2024-12-12 ENCOUNTER — Other Ambulatory Visit (HOSPITAL_COMMUNITY): Payer: Self-pay
